# Patient Record
Sex: Female | Born: 1940 | Race: Black or African American | Hispanic: No | Smoking: Never smoker
Health system: Southern US, Community
[De-identification: ages and names within clinical notes are randomized; demographics above are authoritative.]

## PROBLEM LIST (undated history)

## (undated) DIAGNOSIS — M1990 Unspecified osteoarthritis, unspecified site: Secondary | ICD-10-CM

## (undated) DIAGNOSIS — R519 Headache, unspecified: Secondary | ICD-10-CM

## (undated) DIAGNOSIS — I471 Supraventricular tachycardia: Secondary | ICD-10-CM

## (undated) DIAGNOSIS — T7840XA Allergy, unspecified, initial encounter: Secondary | ICD-10-CM

## (undated) DIAGNOSIS — E785 Hyperlipidemia, unspecified: Secondary | ICD-10-CM

## (undated) DIAGNOSIS — R51 Headache: Secondary | ICD-10-CM

## (undated) DIAGNOSIS — E079 Disorder of thyroid, unspecified: Secondary | ICD-10-CM

## (undated) DIAGNOSIS — I340 Nonrheumatic mitral (valve) insufficiency: Secondary | ICD-10-CM

## (undated) DIAGNOSIS — I493 Ventricular premature depolarization: Secondary | ICD-10-CM

## (undated) DIAGNOSIS — I10 Essential (primary) hypertension: Secondary | ICD-10-CM

## (undated) DIAGNOSIS — Z9289 Personal history of other medical treatment: Secondary | ICD-10-CM

## (undated) DIAGNOSIS — I4719 Other supraventricular tachycardia: Secondary | ICD-10-CM

## (undated) DIAGNOSIS — T783XXA Angioneurotic edema, initial encounter: Secondary | ICD-10-CM

## (undated) DIAGNOSIS — K219 Gastro-esophageal reflux disease without esophagitis: Secondary | ICD-10-CM

## (undated) HISTORY — PX: ABDOMINAL HYSTERECTOMY: SHX81

## (undated) HISTORY — DX: Other supraventricular tachycardia: I47.19

## (undated) HISTORY — DX: Angioneurotic edema, initial encounter: T78.3XXA

## (undated) HISTORY — PX: JOINT REPLACEMENT: SHX530

## (undated) HISTORY — PX: CHOLECYSTECTOMY: SHX55

## (undated) HISTORY — DX: Hyperlipidemia, unspecified: E78.5

## (undated) HISTORY — DX: Personal history of other medical treatment: Z92.89

## (undated) HISTORY — PX: TUBAL LIGATION: SHX77

## (undated) HISTORY — DX: Allergy, unspecified, initial encounter: T78.40XA

## (undated) HISTORY — PX: EYE SURGERY: SHX253

## (undated) HISTORY — DX: Supraventricular tachycardia: I47.1

## (undated) HISTORY — DX: Ventricular premature depolarization: I49.3

## (undated) HISTORY — DX: Nonrheumatic mitral (valve) insufficiency: I34.0

---

## 1998-10-23 HISTORY — PX: REPLACEMENT TOTAL KNEE: SUR1224

## 1999-12-30 ENCOUNTER — Encounter: Payer: Self-pay | Admitting: Orthopedic Surgery

## 2000-01-02 ENCOUNTER — Inpatient Hospital Stay (HOSPITAL_COMMUNITY): Admission: RE | Admit: 2000-01-02 | Discharge: 2000-01-06 | Payer: Self-pay | Admitting: Orthopedic Surgery

## 2001-04-01 ENCOUNTER — Ambulatory Visit (HOSPITAL_COMMUNITY): Admission: RE | Admit: 2001-04-01 | Discharge: 2001-04-01 | Payer: Self-pay | Admitting: Internal Medicine

## 2001-05-13 ENCOUNTER — Ambulatory Visit (HOSPITAL_COMMUNITY): Admission: RE | Admit: 2001-05-13 | Discharge: 2001-05-13 | Payer: Self-pay | Admitting: Family Medicine

## 2001-05-13 ENCOUNTER — Encounter: Payer: Self-pay | Admitting: Family Medicine

## 2001-07-11 ENCOUNTER — Other Ambulatory Visit: Admission: RE | Admit: 2001-07-11 | Discharge: 2001-07-11 | Payer: Self-pay | Admitting: Obstetrics and Gynecology

## 2002-05-28 ENCOUNTER — Encounter: Payer: Self-pay | Admitting: Family Medicine

## 2002-05-28 ENCOUNTER — Ambulatory Visit (HOSPITAL_COMMUNITY): Admission: RE | Admit: 2002-05-28 | Discharge: 2002-05-28 | Payer: Self-pay | Admitting: Family Medicine

## 2003-06-26 ENCOUNTER — Encounter: Payer: Self-pay | Admitting: Family Medicine

## 2003-06-26 ENCOUNTER — Ambulatory Visit (HOSPITAL_COMMUNITY): Admission: RE | Admit: 2003-06-26 | Discharge: 2003-06-26 | Payer: Self-pay | Admitting: Family Medicine

## 2003-07-19 ENCOUNTER — Encounter: Payer: Self-pay | Admitting: Family Medicine

## 2003-07-19 ENCOUNTER — Emergency Department (HOSPITAL_COMMUNITY): Admission: EM | Admit: 2003-07-19 | Discharge: 2003-07-19 | Payer: Self-pay | Admitting: Emergency Medicine

## 2003-10-19 ENCOUNTER — Ambulatory Visit (HOSPITAL_COMMUNITY): Admission: RE | Admit: 2003-10-19 | Discharge: 2003-10-19 | Payer: Self-pay | Admitting: Family Medicine

## 2003-11-30 ENCOUNTER — Encounter (HOSPITAL_COMMUNITY): Admission: RE | Admit: 2003-11-30 | Discharge: 2003-12-30 | Payer: Self-pay | Admitting: Orthopedic Surgery

## 2004-09-28 ENCOUNTER — Ambulatory Visit (HOSPITAL_COMMUNITY): Admission: RE | Admit: 2004-09-28 | Discharge: 2004-09-28 | Payer: Self-pay | Admitting: Family Medicine

## 2005-10-19 ENCOUNTER — Inpatient Hospital Stay (HOSPITAL_COMMUNITY): Admission: EM | Admit: 2005-10-19 | Discharge: 2005-10-25 | Payer: Self-pay | Admitting: Emergency Medicine

## 2005-10-20 ENCOUNTER — Encounter (INDEPENDENT_AMBULATORY_CARE_PROVIDER_SITE_OTHER): Payer: Self-pay | Admitting: General Surgery

## 2005-10-23 DIAGNOSIS — Z9289 Personal history of other medical treatment: Secondary | ICD-10-CM

## 2005-10-23 HISTORY — DX: Personal history of other medical treatment: Z92.89

## 2005-10-24 ENCOUNTER — Ambulatory Visit: Payer: Self-pay | Admitting: *Deleted

## 2005-11-14 ENCOUNTER — Emergency Department (HOSPITAL_COMMUNITY): Admission: EM | Admit: 2005-11-14 | Discharge: 2005-11-14 | Payer: Self-pay | Admitting: Emergency Medicine

## 2005-12-08 ENCOUNTER — Ambulatory Visit (HOSPITAL_COMMUNITY): Admission: RE | Admit: 2005-12-08 | Discharge: 2005-12-08 | Payer: Self-pay | Admitting: Family Medicine

## 2006-12-17 ENCOUNTER — Ambulatory Visit (HOSPITAL_COMMUNITY): Admission: RE | Admit: 2006-12-17 | Discharge: 2006-12-17 | Payer: Self-pay | Admitting: Family Medicine

## 2007-12-27 ENCOUNTER — Ambulatory Visit (HOSPITAL_COMMUNITY): Admission: RE | Admit: 2007-12-27 | Discharge: 2007-12-27 | Payer: Self-pay | Admitting: Family Medicine

## 2008-12-30 ENCOUNTER — Ambulatory Visit (HOSPITAL_COMMUNITY): Admission: RE | Admit: 2008-12-30 | Discharge: 2008-12-30 | Payer: Self-pay | Admitting: Family Medicine

## 2009-12-31 ENCOUNTER — Ambulatory Visit (HOSPITAL_COMMUNITY): Admission: RE | Admit: 2009-12-31 | Discharge: 2009-12-31 | Payer: Self-pay | Admitting: Family Medicine

## 2010-12-19 ENCOUNTER — Other Ambulatory Visit (HOSPITAL_COMMUNITY): Payer: Self-pay | Admitting: Family Medicine

## 2010-12-19 DIAGNOSIS — Z139 Encounter for screening, unspecified: Secondary | ICD-10-CM

## 2011-01-05 ENCOUNTER — Ambulatory Visit (HOSPITAL_COMMUNITY)
Admission: RE | Admit: 2011-01-05 | Discharge: 2011-01-05 | Disposition: A | Discharge status: Home / Self Care | Payer: Medicare Other | Source: Ambulatory Visit | Attending: Family Medicine | Admitting: Family Medicine

## 2011-01-05 DIAGNOSIS — Z139 Encounter for screening, unspecified: Secondary | ICD-10-CM

## 2011-01-05 DIAGNOSIS — Z1231 Encounter for screening mammogram for malignant neoplasm of breast: Secondary | ICD-10-CM | POA: Insufficient documentation

## 2011-02-20 ENCOUNTER — Other Ambulatory Visit: Payer: Self-pay | Admitting: Family Medicine

## 2011-02-20 DIAGNOSIS — Z139 Encounter for screening, unspecified: Secondary | ICD-10-CM

## 2011-02-27 ENCOUNTER — Encounter (HOSPITAL_COMMUNITY): Payer: Self-pay

## 2011-02-27 ENCOUNTER — Ambulatory Visit (HOSPITAL_COMMUNITY)
Admission: RE | Admit: 2011-02-27 | Discharge: 2011-02-27 | Disposition: A | Discharge status: Home / Self Care | Payer: Medicare Other | Source: Ambulatory Visit | Attending: Family Medicine | Admitting: Family Medicine

## 2011-02-27 DIAGNOSIS — Z780 Asymptomatic menopausal state: Secondary | ICD-10-CM | POA: Insufficient documentation

## 2011-02-27 DIAGNOSIS — Z139 Encounter for screening, unspecified: Secondary | ICD-10-CM

## 2011-02-27 HISTORY — DX: Essential (primary) hypertension: I10

## 2011-03-10 NOTE — Discharge Summary (Signed)
NAMERASHEL, OKEEFE             ACCOUNT NO.:  [id number]   MEDICAL RECORD NO.:  [id number]          PATIENT TYPE:  INP   LOCATION:  A219                          FACILITY:  [facility]   PHYSICIAN:  Dirk Dress. Katrinka Blazing, M.D.   DATE OF BIRTH:  01/16/1941   DATE OF ADMISSION:  10/18/2005  DATE OF DISCHARGE:  01/03/2007LH                                 DISCHARGE SUMMARY   DISCHARGE DIAGNOSES:  1.  Acute cholecystitis with cholelithiasis.  2.  Postoperative atelectasis with hypoxemia.  3.  Hypertension.  4.  Hypokalemia.  5.  Gastroesophageal reflux disease.   PROCEDURE:  Laparoscopic cholecystectomy.   DISPOSITION:  The patient is discharged home in stable and improved  condition.   DISCHARGE MEDICATIONS:  1.  Norvasc 5 mg daily.  2.  Ziac one tablet daily.  3.  Ogen 1.25 mg daily.  4.  Levaquin 750 mg daily.  5.  Tylox one to two every 4 hours as needed.   FOLLOW UP:  The patient is scheduled to be seen in the office in 2 weeks.  She will have home health to manage her JP drains.   HOSPITAL COURSE:  This is a 70 year old female with history of acute onset  of severe abdominal pain on December 26.  She was seen in the emergency room  and was noted to have acute cholecystitis with multiple gallstones with  gallbladder wall thickening.  She was admitted and was seen in consultation.  She had mild hypokalemia with potassium 3.2.  A serum amylase was elevated  mildly at 140.  The patient was seen in consultation and was scheduled for  surgery.  Laparoscopic cholecystectomy was done on December 29.  She had  acute gangrenous cholecystitis and was treated with IV antibiotics in the  postop period.  She had some postop atelectasis and hypoxemia which was  treated with nebulizers, increased activity and incentive spirometry.  She  also  had mild volume overload.  Medical problems were managed by Dr. Lilyan Punt.  The patient had an echocardiogram which showed no acute  abnormalities.  By  January 3, she was felt to be stable enough for  discharge.  She was discharged home to have follow-up with Dr. Gerda Diss in 1  week and with me in 2 weeks.      Dirk Dress. Katrinka Blazing, M.D.  Electronically Signed     LCS/MEDQ  D:  12/12/2005  T:  12/12/2005  Job:  [id number]

## 2011-03-10 NOTE — Procedures (Signed)
NAMEBRANDAN, GLAUBER NO.:  [id number]   MEDICAL RECORD NO.:  [id number]          PATIENT TYPE:  INP   LOCATION:  A219                          FACILITY:  [facility]   PHYSICIAN:  Vida Roller, M.D.   DATE OF BIRTH:  1941/05/09   DATE OF PROCEDURE:  10/24/2005  DATE OF DISCHARGE:                                  ECHOCARDIOGRAM   PRIMARY CARE PHYSICIAN:  Dr. Lilyan Punt   TAPE NUMBER:  [id number]   TAPE COUNT:  [id number]   CLINICAL INFORMATION:  This is a woman with increased heart size on chest x-  ray.  Technical quality of the study is difficult.   M-MODE:  AORTA:  31 mm  (<4.0)  LEFT ATRIUM:  41 mm  (<4.0)  SEPTUM:  14 mm  (0.7-1.1)  POSTERIOR WALL:  12 mm  (0.7-1.1)  LV-DIASTOLE:  39 mm  (<5.7)  LV-SYSTOLE:  24 mm  (<4.0)   TWO-DIMENSIONAL AND DOPPLER IMAGING:  The left ventricle is normal size with  preserved LV systolic function.  Estimated ejection fraction 60-65%.  There  are no wall motion abnormalities.  There is left ventricular hypertrophy  which is concentric.   The right ventricle appears to be normal size.   There appears to be mild left atrial enlargement.   There is no obvious valvular heart disease although this evaluation was  limited.  There does not appear to be a pericardial effusion.      Vida Roller, M.D.  Electronically Signed     JH/MEDQ  D:  10/24/2005  T:  10/24/2005  Job:  [id number]

## 2011-03-10 NOTE — Consult Note (Signed)
[name], [name]             ACCOUNT NO.:  [id number]   MEDICAL RECORD NO.:  [id number]          PATIENT TYPE:  OBV   LOCATION:  A219                          FACILITY:  [facility]   PHYSICIAN:  Dirk Dress. Katrinka Blazing, M.D.   DATE OF BIRTH:  1940/12/25   DATE OF CONSULTATION:  DATE OF DISCHARGE:                                   CONSULTATION   CONSULTATION NOTE:  This is a 70 year old female with history of acute onset  of severe abdominal pain on the 26th of December.  This occurred at the  evening meal.  The pain became increasingly more severe.  She had nausea and  vomiting.  She was seen in the emergency room.  She continued to have nausea  and vomiting throughout the night.  The patient was finally admitted and  scheduled for abdominal  ultrasound.  Ultrasound showed acute cholecystitis  with multiple gallstones and gallbladder wall thickening without  pericholecystic fluid, with the common bile duct measuring 5.1 mm.  Patient  is seen in consultation and she is scheduled to have cholecystectomy.   PAST HISTORY:  She has been relatively healthy.  She only has hypertension.  Her only medications are Ziac 2.5/6.25 daily, Ogen 1.25 mg daily and  glucosamine supplement.   SURGERIES:  Hysterectomy, right total knee replacement.   ALLERGIES:  PENICILLIN.   PHYSICAL EXAMINATION:  GENERAL:  The patient is a pleasant female who looks  younger than her stated age.  VITAL SIGNS:  Blood pressure 137/60, pulse 71, respirations 20, temperature  100.3.  HEENT:  Unremarkable.  NECK:  Supple.  No JVD, bruit, adenopathy or thyromegaly.  CHEST:  Clear to auscultation.  HEART:  Regular rate and rhythm without murmur, gallop or rub.  ABDOMEN:  Epigastric and right subcostal tenderness.  Normoactive bowel  sounds.  EXTREMITIES:  No cyanosis, clubbing or edema.  NEUROLOGIC:  No focal motor, sensory or cerebellar deficit.   Her white count is normal.  She has a mild hypokalemia with potassium of  3.2.   Her amylase is slightly elevated at 140.   IMPRESSION:  1.  Acute cholecystitis with cholelithiasis.  2.  Mild elevated amylase, probably related to the acute cholecystitis.  3.  Hypertension.  4.  Hypokalemia.  5.  Gastroesophageal reflux disease.   PLAN:  The patient and her family were counseled for laparoscopic  cholecystectomy.  Will give her three runs of potassium tonight and will  check her BMEP in the morning.  Will also check her amylase in the morning.  If everything else looks OK, will proceed with laparoscopic cholecystectomy  in the morning.      Dirk Dress. Katrinka Blazing, M.D.  Electronically Signed     LCS/MEDQ  D:  10/19/2005  T:  10/20/2005  Job:  [id number]

## 2011-03-10 NOTE — Procedures (Signed)
   NAME:  [name], [name]                       ACCOUNT NO.:  [id number]   MEDICAL RECORD NO.:  [id number]                   PATIENT TYPE:  EMS   LOCATION:  ED                                   FACILITY:  [facility]   PHYSICIAN:  Donna Bernard, M.D.             DATE OF BIRTH:  07/04/1941   DATE OF PROCEDURE:  07/19/2003  DATE OF DISCHARGE:  07/19/2003                                EKG INTERPRETATION   EKG INTERPRETATION:  Electrocardiogram revealed normal sinus rhythm with  nonspecific ST-T changes.  Voltage is somewhat large and is somewhat  elevated in leads V4-V6.  The PR interval is borderline at 0.2.      ___________________________________________                                            Donna Bernard, M.D.   WSL/MEDQ  D:  08/25/2003  T:  08/25/2003  Job:  [id number]

## 2011-03-10 NOTE — H&P (Signed)
[name], [name] NO.:  [id number]   MEDICAL RECORD NO.:  [id number]          PATIENT TYPE:  OBV   LOCATION:  A219                          FACILITY:  [facility]   PHYSICIAN:  Donna Bernard, M.D.DATE OF BIRTH:  1940/11/22   DATE OF ADMISSION:  10/18/2005  DATE OF DISCHARGE:  LH                                HISTORY & PHYSICAL   HISTORY OF PRESENT ILLNESS:  This patient is a 70 year old female with a  benign prior medical history of well-treated hypertension.  The patient was  doing well until the evening before admission.  Soon after the meal, the  patient developed significant epigastric distress.  It was fairly severe  cramping pain that came and went.  This pain was accompanied by significant  nausea and then multiple episodes of vomiting.  The patient went on to the  emergency room and was evaluated.  She continued to have significant  epigastric pain throughout the night along with nausea and vomiting.  The  patient has had no history of any similar abdominal pain before.  She does  give a history of reflux which she takes over-the-counter Zantac or similar  for on occasion.  The patient claims compliance with her medications.   MEDICATIONS:  1.  Ogen 1.25 mg daily.  2.  Glucosamine supplement three daily.  3.  Ziac 2.5/6.25 mg one daily.   PAST SURGICAL HISTORY:  1.  Partial hysterectomy in 1983.  2.  Right knee replacement in 2000.  3.  Last colonoscopy in 2002, with diverticulum.   FAMILY HISTORY:  Positive for breast cancer, hypertension, coronary artery  disease.   ALLERGIES:  PENICILLIN.   SOCIAL HISTORY:  The patient is married.  She is retired from [employer].  No tobacco or alcohol use.  She has three children.   REVIEW OF SYSTEMS:  GASTROINTESTINAL:  No change in bowel habits.  GENITOURINARY:  No urinary symptoms.   PHYSICAL EXAMINATION:  VITAL SIGNS:  Temperature 99.6, blood pressure  142/86.  GENERAL:  The patient is  alert in some distress.  HEENT:  Normal.  NECK:  Supple.  LUNGS:  Clear.  HEART:  Regular rate and rhythm.  ABDOMEN:  Epigastrium moderately tender to deep palpation.  Bowel sounds  present.  No rebound, guarding or masses.  EXTREMITIES:  Normal.  NEUROLOGIC:  Intact.   LABORATORY DATA AND X-RAY FINDINGS:  CBC with white blood count normal.  MET  7 with potassium low at 3.2.  Amylase up slightly at 140.  Liver enzymes  normal.   CT scan of abdomen and pelvis unremarkable.   IMPRESSION:  1.  Rather sudden onset of abdominal pain with epigastric tenderness,      slightly elevated amylase.  Importance of this discussed with the      patient.  2.  Hypertension, well-controlled.  3.  Hypokalemia.  4.  History of reflux.   PLAN:  1.  Gallbladder ultrasound.  2.  Stat triglyceride level to check for possible etiologies of pancreatitis      such as a gallstone.  3.  Further orders as  noted in the chart.      Donna Bernard, M.D.  Electronically Signed     WSL/MEDQ  D:  10/19/2005  T:  10/19/2005  Job:  [id number]

## 2011-03-10 NOTE — Op Note (Signed)
NAMEADALEI, NOVELL             ACCOUNT NO.:  [id number]   MEDICAL RECORD NO.:  [id number]          PATIENT TYPE:  INP   LOCATION:  A219                          FACILITY:  [facility]   PHYSICIAN:  Dirk Dress. Katrinka Blazing, M.D.   DATE OF BIRTH:  11-18-1940   DATE OF PROCEDURE:  10/20/2005  DATE OF DISCHARGE:                                 OPERATIVE REPORT   PREOPERATIVE DIAGNOSIS:  Acute cholecystitis, cholelithiasis.   POSTOPERATIVE DIAGNOSIS:  Acute gangrenous cholecystitis, cholelithiasis.   PROCEDURE:  Laparoscopic cholecystectomy.   SURGEON:  Dirk Dress. Katrinka Blazing, M.D.   DESCRIPTION OF PROCEDURE:  Under general anesthesia the patient's abdomen  was prepped and draped in a sterile field.  A supraumbilical incision was  made and a Veress needle was inserted uneventfully.  The abdomen was  insufflated with 2 L of CO2.  Using a Vis-A-Port guide, a 10-mm port was  placed.  A gangrenous gallbladder was visualized.  The patient was placed in  reverse Trendelenburg position.   Under videoscopic guidance a 10-mm port and two 5-mm ports were placed in  the right subcostal region.  The gallbladder was grasped and positioned.  There were many acute adhesions to the gallbladder.  These were separated  from the wall of the gallbladder with blunt dissection.  Once this was done,  the infundibulum of the gallbladder was dissected until the cystic duct was  identified. The cystic duct appeared to be foreshortened, so it was  carefully dissected back to the gallbladder.  Close to the gallbladder the  cystic duct was clamped with three clamps and divided.  These three clamps  were left in place, since there appeared to be a stone impacted in the  cystic duct and there was no significant leak.  There were two cystic artery  branches.  These were dissected up to the wall of the gallbladder, clipped  with 3 clips and divided.  The gallbladder was severely necrotic.  It was,  therefore decompressed.  After this  was done, it was removed using  electrocautery.  The posterior wall was severely necrotic and green in  texture.  The gallbladder was removed and placed in an EndoCatch device.  It  was pulled from the abdomen.  It contained large and small stones.  Hemostasis in the bed was achieved. There was no bleeding from the bed.  There was no evidence of bile leak, but most of the bed appeared to be green  from necrosis.   A JP drain was placed, placed in the subhepatic space.  It was brought out  through the most lateral stab incision. Inspection was carried out and there  was no evidence of any bleeding.  CO2 was allowed to escape from the abdomen  and the ports were removed.  The incision at the umbilicus was closed with  #0 Vicryl on the fascia.  All  incision were closed with staples.  The drain was secured with 3-0 nylon.  The patient tolerated the procedure well.  Dressings were placed.  She was  awakened from anesthesia uneventfully, transferred to a bed, and taken to  the postanesthetic care unit for further monitoring.      Dirk Dress. Katrinka Blazing, M.D.  Electronically Signed     LCS/MEDQ  D:  10/20/2005  T:  10/20/2005  Job:  [id number]

## 2011-04-25 ENCOUNTER — Emergency Department (HOSPITAL_COMMUNITY)
Admission: EM | Admit: 2011-04-25 | Discharge: 2011-04-26 | Disposition: A | Discharge status: Home / Self Care | Payer: Medicare Other | Attending: Emergency Medicine | Admitting: Emergency Medicine

## 2011-04-25 DIAGNOSIS — L299 Pruritus, unspecified: Secondary | ICD-10-CM | POA: Insufficient documentation

## 2011-04-25 DIAGNOSIS — T783XXA Angioneurotic edema, initial encounter: Secondary | ICD-10-CM | POA: Insufficient documentation

## 2011-04-25 DIAGNOSIS — X58XXXA Exposure to other specified factors, initial encounter: Secondary | ICD-10-CM | POA: Insufficient documentation

## 2011-04-25 DIAGNOSIS — R220 Localized swelling, mass and lump, head: Secondary | ICD-10-CM | POA: Insufficient documentation

## 2011-04-25 DIAGNOSIS — I10 Essential (primary) hypertension: Secondary | ICD-10-CM | POA: Insufficient documentation

## 2011-04-25 DIAGNOSIS — Z79899 Other long term (current) drug therapy: Secondary | ICD-10-CM | POA: Insufficient documentation

## 2011-06-15 ENCOUNTER — Ambulatory Visit (HOSPITAL_COMMUNITY)
Admission: RE | Admit: 2011-06-15 | Discharge: 2011-06-15 | Disposition: A | Discharge status: Home / Self Care | Payer: Medicare Other | Source: Ambulatory Visit | Attending: Internal Medicine | Admitting: Internal Medicine

## 2011-06-15 ENCOUNTER — Encounter (HOSPITAL_COMMUNITY): Payer: Self-pay | Admitting: *Deleted

## 2011-06-15 ENCOUNTER — Encounter (HOSPITAL_COMMUNITY)
Admission: RE | Disposition: A | Discharge status: Home / Self Care | Payer: Self-pay | Source: Ambulatory Visit | Attending: Internal Medicine

## 2011-06-15 ENCOUNTER — Encounter (INDEPENDENT_AMBULATORY_CARE_PROVIDER_SITE_OTHER): Payer: Medicare Other | Admitting: Internal Medicine

## 2011-06-15 DIAGNOSIS — Z1211 Encounter for screening for malignant neoplasm of colon: Secondary | ICD-10-CM

## 2011-06-15 DIAGNOSIS — K644 Residual hemorrhoidal skin tags: Secondary | ICD-10-CM

## 2011-06-15 DIAGNOSIS — Z79899 Other long term (current) drug therapy: Secondary | ICD-10-CM | POA: Insufficient documentation

## 2011-06-15 DIAGNOSIS — I10 Essential (primary) hypertension: Secondary | ICD-10-CM | POA: Insufficient documentation

## 2011-06-15 DIAGNOSIS — K5730 Diverticulosis of large intestine without perforation or abscess without bleeding: Secondary | ICD-10-CM | POA: Insufficient documentation

## 2011-06-15 DIAGNOSIS — Z800 Family history of malignant neoplasm of digestive organs: Secondary | ICD-10-CM

## 2011-06-15 DIAGNOSIS — E119 Type 2 diabetes mellitus without complications: Secondary | ICD-10-CM | POA: Insufficient documentation

## 2011-06-15 HISTORY — PX: COLONOSCOPY: SHX5424

## 2011-06-15 HISTORY — DX: Unspecified osteoarthritis, unspecified site: M19.90

## 2011-06-15 SURGERY — COLONOSCOPY
Anesthesia: Moderate Sedation

## 2011-06-15 MED ORDER — STERILE WATER FOR IRRIGATION IR SOLN
Status: DC | PRN
  Administered 2011-06-15: 10:00:00

## 2011-06-15 MED ORDER — SODIUM CHLORIDE 0.45 % IV SOLN
Freq: Once | INTRAVENOUS | Status: AC
  Administered 2011-06-15: 1000 mL via INTRAVENOUS

## 2011-06-15 MED ORDER — MEPERIDINE HCL 50 MG/ML IJ SOLN
INTRAMUSCULAR | Status: DC | PRN
  Administered 2011-06-15 (×2): 25 mg via INTRAVENOUS

## 2011-06-15 MED ORDER — MEPERIDINE HCL 50 MG/ML IJ SOLN
INTRAMUSCULAR | Status: AC
  Filled 2011-06-15: qty 1

## 2011-06-15 MED ORDER — MIDAZOLAM HCL 5 MG/5ML IJ SOLN
INTRAMUSCULAR | Status: AC
  Filled 2011-06-15: qty 5

## 2011-06-15 MED ORDER — MIDAZOLAM HCL 5 MG/5ML IJ SOLN
INTRAMUSCULAR | Status: DC | PRN
  Administered 2011-06-15 (×2): 2 mg via INTRAVENOUS

## 2011-06-15 NOTE — Op Note (Signed)
COLONOSCOPY PROCEDURE REPORT  PATIENT:  [name]  MR#:  [id number] Birthdate:  10-04-1941, 70 y.o., female Endoscopist:  Dr. Malissa Hippo, MD Referred By:  Dr. Lilyan Punt, MD Procedure Date: 06/15/2011  Procedure:   Colonoscopy  Indications:  Screening colonoscopy patient's last exam was 10 years ago. Since then her brother has been diagnosed with colon carcinoma at age 1 and doing well a few years later.  Informed Consent:  Procedure and risks were reviewed with the patient. Her questions were answered. Informed consent was obtained. Medications:  Demerol 50 mg IV Versed  4 mg IV  Description of procedure:  After a digital rectal exam was performed, that colonoscope was advanced from the anus through the rectum and colon to the area of the cecum, ileocecal valve and appendiceal orifice. The cecum was deeply intubated. These structures were well-seen and photographed for the record. From the level of the cecum and ileocecal valve, the scope was slowly and cautiously withdrawn. The mucosal surfaces were carefully surveyed utilizing scope tip to flexion to facilitate fold flattening as needed. The scope was pulled down into the rectum where a thorough exam including retroflexion was performed.  Findings:   Prep satisfactory. 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  Therapeutic/Diagnostic Maneuvers Performed:   None  Complications:   None  Cecal Withdrawal Time:   10 minutes  Impression:   Examination performed to cecum.  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   Recommendations:  Resume usual medications. High fiber diet. Next examination in 5 years   Jen Eppinger U  06/15/2011 10:17 AM  CC: Dr. Lilyan Punt, MD, MD

## 2011-06-15 NOTE — H&P (Signed)
[name] is an 70 y.o. female.   Chief Complaint: Patient is here for colonoscopy HPI: Patient is 70 year old female was screening colonoscopy. Patient's last exam was 10 years ago and was normal. She denies abdominal pain rectal bleeding or recent change in her bowel habits. She has a good appetite and has not lost any weight. She walks regularly for health reasons. Her brother was diagnosed with colon carcinoma a few years ago at age 42 and is doing fine.   Past Medical History  Diagnosis Date  . Hypertension   . Arthritis     Past Surgical History  Procedure Date  . Abdominal hysterectomy   . Joint replacement   . Tubal ligation     Family History  Problem Relation Age of Onset  . Hypertension Mother   . Coronary artery disease Mother   . Hypertension Father   . Coronary artery disease Father   . Heart failure Sister   . Hypertension Sister   . Cancer Brother   . Diabetes Brother    Social History:  reports that she has never smoked. She does not have any smokeless tobacco history on file. She reports that she does not drink alcohol or use illicit drugs.  Allergies:  Allergies  Allergen Reactions  . Penicillins Hives    Medications Prior to Admission  Medication Dose Route Frequency Provider Last Rate Last Dose  . 0.45 % sodium chloride infusion   Intravenous Once Malissa Hippo, MD 20 mL/hr at 06/15/11 0850 1,000 mL at 06/15/11 0850  . meperidine (DEMEROL) 50 MG/ML injection           . midazolam (VERSED) 5 MG/5ML injection            Medications Prior to Admission  Medication Sig Dispense Refill  . amLODipine (NORVASC) 10 MG tablet Take 10 mg by mouth daily.        . bisoprolol-hydrochlorothiazide (ZIAC) 2.5-6.25 MG per tablet Take 1 tablet by mouth daily.          No results found for this or any previous visit (from the past 48 hour(s)). No results found.  Review of Systems  Constitutional: Negative for weight loss.  Gastrointestinal: Negative  for abdominal pain, diarrhea, constipation, blood in stool and melena.    Blood pressure 156/69, pulse 66, temperature 98.6 F (37 C), temperature source Oral, resp. rate 18, height 5\' 9"  (1.753 m), weight 182 lb (82.555 kg), SpO2 97.00%. Physical Exam  Constitutional: She appears well-developed and well-nourished.  HENT:  Mouth/Throat: Oropharynx is clear and moist.  Eyes: Conjunctivae are normal. No scleral icterus.  Neck: No thyromegaly present.  Cardiovascular: Normal rate, regular rhythm and normal heart sounds.   No murmur heard. Respiratory: Breath sounds normal.  GI: Soft. She exhibits no distension and no mass. There is no tenderness.  Musculoskeletal: She exhibits no edema.  Lymphadenopathy:    She has no cervical adenopathy.  Neurological: She is alert.  Skin: Skin is warm and dry.     Assessment/Plan Screening colonoscopy. Family history positive for colorectal carcinoma in first-degree relative( brother  ).  REHMAN,NAJEEB U 06/15/2011, 9:47 AM

## 2011-06-21 ENCOUNTER — Encounter (HOSPITAL_COMMUNITY): Payer: Self-pay | Admitting: Internal Medicine

## 2011-12-11 ENCOUNTER — Other Ambulatory Visit: Payer: Self-pay | Admitting: Family Medicine

## 2011-12-11 DIAGNOSIS — Z139 Encounter for screening, unspecified: Secondary | ICD-10-CM

## 2012-01-08 ENCOUNTER — Ambulatory Visit (HOSPITAL_COMMUNITY)
Admission: RE | Admit: 2012-01-08 | Discharge: 2012-01-08 | Disposition: A | Discharge status: Home / Self Care | Payer: Medicare Other | Source: Ambulatory Visit | Attending: Family Medicine | Admitting: Family Medicine

## 2012-01-08 DIAGNOSIS — Z1231 Encounter for screening mammogram for malignant neoplasm of breast: Secondary | ICD-10-CM | POA: Insufficient documentation

## 2012-01-08 DIAGNOSIS — Z139 Encounter for screening, unspecified: Secondary | ICD-10-CM

## 2012-10-30 ENCOUNTER — Other Ambulatory Visit: Payer: Self-pay | Admitting: Family Medicine

## 2012-10-30 ENCOUNTER — Ambulatory Visit (HOSPITAL_COMMUNITY)
Admission: RE | Admit: 2012-10-30 | Discharge: 2012-10-30 | Disposition: A | Discharge status: Home / Self Care | Payer: Medicare Other | Source: Ambulatory Visit | Attending: Family Medicine | Admitting: Family Medicine

## 2012-10-30 DIAGNOSIS — M25569 Pain in unspecified knee: Secondary | ICD-10-CM | POA: Insufficient documentation

## 2012-10-30 DIAGNOSIS — M25562 Pain in left knee: Secondary | ICD-10-CM

## 2013-01-01 ENCOUNTER — Other Ambulatory Visit: Payer: Self-pay | Admitting: Family Medicine

## 2013-01-01 DIAGNOSIS — Z139 Encounter for screening, unspecified: Secondary | ICD-10-CM

## 2013-01-09 ENCOUNTER — Ambulatory Visit (HOSPITAL_COMMUNITY)
Admission: RE | Admit: 2013-01-09 | Discharge: 2013-01-09 | Disposition: A | Discharge status: Home / Self Care | Payer: Medicare Other | Source: Ambulatory Visit | Attending: Family Medicine | Admitting: Family Medicine

## 2013-01-09 DIAGNOSIS — Z139 Encounter for screening, unspecified: Secondary | ICD-10-CM

## 2013-01-09 DIAGNOSIS — Z1231 Encounter for screening mammogram for malignant neoplasm of breast: Secondary | ICD-10-CM | POA: Insufficient documentation

## 2013-03-06 ENCOUNTER — Other Ambulatory Visit: Payer: Self-pay | Admitting: Family Medicine

## 2013-03-06 LAB — LIPID PANEL
Cholesterol: 214 mg/dL — ABNORMAL HIGH (ref 0–200)
Triglycerides: 99 mg/dL (ref <150)
VLDL: 20 mg/dL (ref 0–40)

## 2013-03-06 LAB — HEPATIC FUNCTION PANEL
ALT: 11 U/L (ref 0–35)
Alkaline Phosphatase: 60 U/L (ref 39–117)
Bilirubin, Direct: 0.1 mg/dL (ref 0.0–0.3)
Indirect Bilirubin: 0.6 mg/dL (ref 0.0–0.9)
Total Protein: 6.5 g/dL (ref 6.0–8.3)

## 2013-03-06 LAB — T4, FREE: Free T4: 1.2 ng/dL (ref 0.80–1.80)

## 2013-03-09 ENCOUNTER — Encounter: Payer: Self-pay | Admitting: Family Medicine

## 2013-03-18 ENCOUNTER — Ambulatory Visit: Payer: Self-pay | Admitting: Family Medicine

## 2013-03-23 ENCOUNTER — Other Ambulatory Visit: Payer: Self-pay | Admitting: Family Medicine

## 2013-04-21 ENCOUNTER — Encounter: Payer: Self-pay | Admitting: Nurse Practitioner

## 2013-04-21 ENCOUNTER — Ambulatory Visit (INDEPENDENT_AMBULATORY_CARE_PROVIDER_SITE_OTHER): Payer: Medicare Other | Admitting: Nurse Practitioner

## 2013-04-21 VITALS — BP 132/70 | Temp 98.4°F | Wt 187.5 lb

## 2013-04-21 DIAGNOSIS — Z23 Encounter for immunization: Secondary | ICD-10-CM

## 2013-04-21 DIAGNOSIS — T148 Other injury of unspecified body region: Secondary | ICD-10-CM

## 2013-04-21 DIAGNOSIS — L02419 Cutaneous abscess of limb, unspecified: Secondary | ICD-10-CM

## 2013-04-21 DIAGNOSIS — W57XXXA Bitten or stung by nonvenomous insect and other nonvenomous arthropods, initial encounter: Secondary | ICD-10-CM

## 2013-04-21 MED ORDER — DOXYCYCLINE HYCLATE 100 MG PO TABS: 100.0000 mg | ORAL_TABLET | Freq: Two times a day (BID) | ORAL | Status: DC

## 2013-04-21 NOTE — Patient Instructions (Addendum)
Burrow's solution as directed; use on compresses to help dry out weeping of wound

## 2013-04-23 ENCOUNTER — Ambulatory Visit (INDEPENDENT_AMBULATORY_CARE_PROVIDER_SITE_OTHER): Payer: Medicare Other | Admitting: Nurse Practitioner

## 2013-04-23 ENCOUNTER — Encounter: Payer: Self-pay | Admitting: Nurse Practitioner

## 2013-04-23 VITALS — BP 150/62 | Temp 98.4°F | Wt 185.5 lb

## 2013-04-23 DIAGNOSIS — L03119 Cellulitis of unspecified part of limb: Secondary | ICD-10-CM

## 2013-04-23 NOTE — Progress Notes (Signed)
Subjective:  Presents complaints of a sore area on her right lower leg which has gotten progressively worse over the past few days. Started off with a small bump then progressed to a blister. Mildly tender to palpation. Mildly pruritic. No fever. Remembers getting some portal bite in the area. Had another one on her arm which this one has resolved without difficulty. No other rash. No headache. No joint pain.  Objective:   BP 132/70  Temp(Src) 98.4 F (36.9 C) (Oral)  Wt 187 lb 8 oz (85.049 kg)  BMI 27.68 kg/m2 NAD. Alert, oriented. Lungs clear. Heart regular rate rhythm. A 3-4 cm open circular lesion noted on the right lower posterior leg. Several small bullae noted within this space. Several have ruptured leaving a superficial open wound. Clear yellowish fluid constantly draining from area. Culture obtained.   Assessment:Insect bite  Cellulitis and abscess of leg, except foot - Plan: Wound culture  Need for prophylactic vaccination and inoculation against other viral diseases(V04.89) - Plan: Dt vaccine greater than 7yo IM  Some form of spider or insect bite with possible secondary infection Plan:Burrow's solution as directed; use on compresses to help dry out weeping of wound Meds ordered this encounter  Medications  . doxycycline (VIBRA-TABS) 100 MG tablet    Sig: Take 1 tablet (100 mg total) by mouth 2 (two) times daily.    Dispense:  14 tablet    Refill:  0    Order Specific Question:  Supervising Provider    Answer:  Merlyn Albert [[id number]]   Warning signs reviewed. Recheck in 48 hours, call back sooner if worse.

## 2013-04-24 ENCOUNTER — Encounter: Payer: Self-pay | Admitting: Nurse Practitioner

## 2013-04-24 DIAGNOSIS — E785 Hyperlipidemia, unspecified: Secondary | ICD-10-CM | POA: Insufficient documentation

## 2013-04-24 DIAGNOSIS — I10 Essential (primary) hypertension: Secondary | ICD-10-CM | POA: Insufficient documentation

## 2013-04-24 LAB — WOUND CULTURE: Gram Stain: NONE SEEN

## 2013-04-24 NOTE — Progress Notes (Signed)
Subjective:  Presents for recheck on the lesion on her right leg see previous note. Less drainage noted. Area has not spread. Slight increase in tenderness. No fever. Did not try Burrows solution.   Objective:   BP 150/62  Temp(Src) 98.4 F (36.9 C) (Oral)  Wt 185 lb 8 oz (84.142 kg)  BMI 27.38 kg/m2 NAD. Alert, oriented. Lesion remains the same size as previous visit. There is less drainage noted although some slight clear yellowish fluid can still be seen from within. Tissue looks healthy. There is faint erythema around the border. Mildly tender to palpation.  Assessment:Cellulitis and abscess of leg  Plan: Continue doxycycline as directed. Given information on Ronna Polio solution to use compresses several times a day. Warning signs reviewed. Call back on 7/7 if no significant improvement, go to ER over the weekend if symptoms worsen.

## 2013-04-29 ENCOUNTER — Ambulatory Visit (INDEPENDENT_AMBULATORY_CARE_PROVIDER_SITE_OTHER): Payer: Medicare Other | Admitting: Family Medicine

## 2013-04-29 ENCOUNTER — Encounter: Payer: Self-pay | Admitting: Family Medicine

## 2013-04-29 VITALS — BP 126/68 | Temp 98.5°F | Wt 187.0 lb

## 2013-04-29 DIAGNOSIS — R358 Other polyuria: Secondary | ICD-10-CM

## 2013-04-29 DIAGNOSIS — R3589 Other polyuria: Secondary | ICD-10-CM

## 2013-04-29 LAB — GLUCOSE, POCT (MANUAL RESULT ENTRY): POC Glucose: 104 mg/dl — AB (ref 70–99)

## 2013-04-29 MED ORDER — SULFAMETHOXAZOLE-TRIMETHOPRIM 800-160 MG PO TABS: 1.0000 | ORAL_TABLET | Freq: Two times a day (BID) | ORAL | Status: DC

## 2013-04-29 NOTE — Progress Notes (Signed)
  Subjective:    Patient ID: [name], female    DOB: June 29, 1941, 72 y.o.   MRN: [id number]  HPIhere for a recheck on right leg. She has finished antibiotic but leg is not any better. She wants to get results of wound culture.  Results of the culture was discussed with her. Patient has been trying a cream at home as well as bandage and tape.  Review of Systems No fever per    Objective:   Physical Exam  It is approximately an inch and a half in diameter. It is described over there is no active pus. There are some blisters clear fluid around the edge this could be related to allergy to tape. There is no sign of any fever or severe cellulitis with it there is moderate tenderness      Assessment & Plan:  She is concerned that this area is getting worse. To use Bactrim DS twice a day, dressing daily she will call us with the cream she has been using she may need Bactroban called in. Recheck patient in 2-3 weeks. If this area is getting worse consider referral to wound center. Pulses are good in the feet.  Glucose was checked to rule out possibility of diabetes as intervening factor II poor t healing

## 2013-04-30 ENCOUNTER — Telehealth: Payer: Self-pay | Admitting: Family Medicine

## 2013-04-30 MED ORDER — MUPIROCIN 2 % EX OINT: TOPICAL_OINTMENT | CUTANEOUS | Status: DC

## 2013-04-30 NOTE — Telephone Encounter (Signed)
Med sent electronically to [facility]. Patient notified.

## 2013-04-30 NOTE — Telephone Encounter (Signed)
Fluticasone may be used if itching. Call in bactroban oint 30g, apply bid thin amount for 7 to 10 days

## 2013-04-30 NOTE — Telephone Encounter (Signed)
Patient calling to let you know that she is on the fluticasone propionate cream and nystatin cream [facility]

## 2013-05-10 ENCOUNTER — Encounter: Payer: Self-pay | Admitting: *Deleted

## 2013-05-12 ENCOUNTER — Ambulatory Visit: Payer: Medicare Other | Admitting: Nurse Practitioner

## 2013-05-12 ENCOUNTER — Ambulatory Visit (INDEPENDENT_AMBULATORY_CARE_PROVIDER_SITE_OTHER): Payer: Medicare Other | Admitting: Nurse Practitioner

## 2013-05-12 ENCOUNTER — Encounter: Payer: Self-pay | Admitting: Nurse Practitioner

## 2013-05-12 VITALS — BP 132/70 | Wt 182.0 lb

## 2013-05-12 DIAGNOSIS — T7840XA Allergy, unspecified, initial encounter: Secondary | ICD-10-CM

## 2013-05-12 DIAGNOSIS — L03115 Cellulitis of right lower limb: Secondary | ICD-10-CM

## 2013-05-12 DIAGNOSIS — L03119 Cellulitis of unspecified part of limb: Secondary | ICD-10-CM

## 2013-05-12 MED ORDER — EPINEPHRINE 0.3 MG/0.3ML IJ SOAJ: 0.3000 mg | Freq: Once | INTRAMUSCULAR | Status: DC

## 2013-05-12 MED ORDER — METHYLPREDNISOLONE ACETATE 40 MG/ML IJ SUSP
40.0000 mg | Freq: Once | INTRAMUSCULAR | Status: AC
  Administered 2013-05-12: 40 mg via INTRAMUSCULAR

## 2013-05-12 MED ORDER — PREDNISONE 20 MG PO TABS: ORAL_TABLET | ORAL | Status: DC

## 2013-05-12 NOTE — Patient Instructions (Addendum)
Take Benadryl as directed  Take Zantac 150 mg once a day

## 2013-05-13 DIAGNOSIS — T7840XA Allergy, unspecified, initial encounter: Secondary | ICD-10-CM | POA: Insufficient documentation

## 2013-05-13 NOTE — Progress Notes (Signed)
Subjective:  Presents for recheck. Patient's wound on her right leg has basically healed. Woke up this morning with a flareup of her allergies. Symptoms have been fairly severe in the past. Was 72 years old the first time she had this episode. It was another 18 years before she had another one. About 2 years ago had to go to the emergency room for tongue and lip swelling. Had an allergy workup with local specialist, tested negative. At 2 AM this morning patient began having swelling in her face. Throat has felt slightly tight but no difficulty breathing or swallowing. No wheezing. Has still not identified any specific allergen. No hives. Just completed a course of Bactrim. Symptoms have not been associated with antibiotic use.  Objective:   BP 132/70  Wt 182 lb (82.555 kg)  BMI 26.86 kg/m2 NAD. Alert, oriented. Significant edema of the lower lip. Mild edema of the face. No hives noted. Lungs clear. Heart regular rate rhythm. Wound on right lower leg almost completely healed with one small area of superficial scab. Nontender.  Assessment:Allergic reaction, initial encounter - Plan: methylPREDNISolone acetate (DEPO-MEDROL) injection 40 mg  Cellulitis of leg, right  resolving  Plan: Meds ordered this encounter  Medications  . methylPREDNISolone acetate (DEPO-MEDROL) injection 40 mg    Sig:   . predniSONE (DELTASONE) 20 MG tablet    Sig: 3 po qd x 3 d then 2 po qd x 3 d then 1 po qd x 3 d    Dispense:  18 tablet    Refill:  0    Start 7/22 AM    Order Specific Question:  Supervising Provider    Answer:  Merlyn Albert [[id number]]  . EPINEPHrine (EPI-PEN) 0.3 mg/0.3 mL SOAJ    Sig: Inject 0.3 mLs (0.3 mg total) into the muscle once.    Dispense:  1 Device    Refill:  5    Order Specific Question:  Supervising Provider    Answer:  Merlyn Albert [[id number]]   Patient to keep EpiPen with her at all times. Antihistamines and Zantac as directed. Reviewed warning signs. Seek help immediately if  symptoms worsen. Call back in 48 hours if no improvement.

## 2013-06-21 ENCOUNTER — Telehealth: Payer: Self-pay

## 2013-06-21 NOTE — Telephone Encounter (Signed)
Patient called and stated that she just used her Epipen because she started swelling and after using it she is still experiencing swelling. Advised patient to go to the emergency department immediately. Patient verbalized understanding.

## 2013-06-24 NOTE — Telephone Encounter (Signed)
I spoke with the patient on the day she called in. I discussed with her it sounded like she was having angioedema. She has had this before. It was not compromising her airway. Prednisone was called in. She was advised to take a generic Allegra daily in addition to all of this. If she has further trouble followup. She has had a history of this since childhood. Sounds like idiopathic angioedema. She understood warning signs as well.

## 2013-07-21 ENCOUNTER — Other Ambulatory Visit: Payer: Self-pay | Admitting: Family Medicine

## 2013-07-30 ENCOUNTER — Ambulatory Visit (INDEPENDENT_AMBULATORY_CARE_PROVIDER_SITE_OTHER): Payer: Medicare Other | Admitting: Nurse Practitioner

## 2013-07-30 ENCOUNTER — Encounter: Payer: Self-pay | Admitting: Nurse Practitioner

## 2013-07-30 VITALS — BP 152/90 | Temp 99.1°F | Ht 68.0 in | Wt 189.0 lb

## 2013-07-30 DIAGNOSIS — B349 Viral infection, unspecified: Secondary | ICD-10-CM

## 2013-07-30 DIAGNOSIS — B9789 Other viral agents as the cause of diseases classified elsewhere: Secondary | ICD-10-CM

## 2013-07-30 DIAGNOSIS — R112 Nausea with vomiting, unspecified: Secondary | ICD-10-CM

## 2013-07-30 MED ORDER — PROMETHAZINE HCL 25 MG/ML IJ SOLN
25.0000 mg | Freq: Once | INTRAMUSCULAR | Status: AC
  Administered 2013-07-30: 25 mg via INTRAMUSCULAR

## 2013-07-30 MED ORDER — SUCRALFATE 1 GM/10ML PO SUSP: 1.0000 g | Freq: Four times a day (QID) | ORAL | Status: DC

## 2013-07-30 MED ORDER — PROMETHAZINE HCL 25 MG PO TABS: ORAL_TABLET | ORAL | Status: DC

## 2013-07-30 MED ORDER — RANITIDINE HCL 300 MG PO TABS: 300.0000 mg | ORAL_TABLET | Freq: Every day | ORAL | Status: DC

## 2013-07-30 NOTE — Patient Instructions (Addendum)
  Start Zantac and liquid Carafate as soon as fluids stay down In 4 hours, about 5:30 may take another dose of nausea medicine (phenergan) Clear liquids

## 2013-07-31 ENCOUNTER — Encounter: Payer: Self-pay | Admitting: Nurse Practitioner

## 2013-07-31 NOTE — Progress Notes (Signed)
Subjective:  Presents with her husband for complaints of vomiting that started within the past hour. Has had several episodes. Slight fever. Just started having some headache. No diarrhea. No body aches. No abdominal pain. Has a history of reflux which was under good control. Having some mid chest pain with pain when she swallows fluid or saliva. No shortness of breath. No previous chest pain before vomiting began. No urinary symptoms.  Objective:   BP 152/90  Temp(Src) 99.1 F (37.3 C) (Oral)  Ht 5\' 8"  (1.727 m)  Wt 189 lb (85.73 kg)  BMI 28.74 kg/m2 NAD. Alert, oriented. Several episodes of vomiting/dry heaves noted. Lungs clear. Heart regular rate rhythm. Abdomen soft nondistended with active bowel sounds; mild upper epigastric area tenderness, otherwise benign.  Assessment:Nausea with vomiting - Plan: promethazine (PHENERGAN) injection 25 mg  Viral illness  Acute esophagitis  Plan: Meds ordered this encounter  Medications  . ranitidine (ZANTAC) 300 MG tablet    Sig: Take 1 tablet (300 mg total) by mouth at bedtime.    Dispense:  30 tablet    Refill:  5    Order Specific Question:  Supervising Provider    Answer:  Merlyn Albert [[id number]]  . promethazine (PHENERGAN) 25 MG tablet    Sig: One po q 4 hrs prn vomiting    Dispense:  30 tablet    Refill:  0    Order Specific Question:  Supervising Provider    Answer:  Merlyn Albert [[id number]]  . sucralfate (CARAFATE) 1 GM/10ML suspension    Sig: Take 10 mLs (1 g total) by mouth 4 (four) times daily.    Dispense:  420 mL    Refill:  0    Order Specific Question:  Supervising Provider    Answer:  Merlyn Albert [[id number]]  . promethazine (PHENERGAN) injection 25 mg    Sig:    Given written and verbal instructions on home care. Gradually increase clear fluid intake. Reviewed warning signs including signs of dehydration. Call back in a.m. if vomiting persists, go to ER tonight if symptoms worsen.

## 2013-08-16 ENCOUNTER — Encounter (HOSPITAL_COMMUNITY): Payer: Self-pay | Admitting: Emergency Medicine

## 2013-08-16 ENCOUNTER — Emergency Department (HOSPITAL_COMMUNITY)
Admission: EM | Admit: 2013-08-16 | Discharge: 2013-08-16 | Disposition: A | Discharge status: Home / Self Care | Payer: Medicare Other | Attending: Emergency Medicine | Admitting: Emergency Medicine

## 2013-08-16 DIAGNOSIS — Z8639 Personal history of other endocrine, nutritional and metabolic disease: Secondary | ICD-10-CM | POA: Insufficient documentation

## 2013-08-16 DIAGNOSIS — T783XXA Angioneurotic edema, initial encounter: Secondary | ICD-10-CM | POA: Insufficient documentation

## 2013-08-16 DIAGNOSIS — IMO0002 Reserved for concepts with insufficient information to code with codable children: Secondary | ICD-10-CM | POA: Insufficient documentation

## 2013-08-16 DIAGNOSIS — Z862 Personal history of diseases of the blood and blood-forming organs and certain disorders involving the immune mechanism: Secondary | ICD-10-CM | POA: Insufficient documentation

## 2013-08-16 DIAGNOSIS — Z9189 Other specified personal risk factors, not elsewhere classified: Secondary | ICD-10-CM | POA: Insufficient documentation

## 2013-08-16 DIAGNOSIS — Z79899 Other long term (current) drug therapy: Secondary | ICD-10-CM | POA: Insufficient documentation

## 2013-08-16 DIAGNOSIS — J029 Acute pharyngitis, unspecified: Secondary | ICD-10-CM | POA: Insufficient documentation

## 2013-08-16 DIAGNOSIS — Z880 Allergy status to penicillin: Secondary | ICD-10-CM | POA: Insufficient documentation

## 2013-08-16 DIAGNOSIS — M129 Arthropathy, unspecified: Secondary | ICD-10-CM | POA: Insufficient documentation

## 2013-08-16 DIAGNOSIS — I10 Essential (primary) hypertension: Secondary | ICD-10-CM | POA: Insufficient documentation

## 2013-08-16 MED ORDER — DIPHENHYDRAMINE HCL 50 MG/ML IJ SOLN
25.0000 mg | Freq: Once | INTRAMUSCULAR | Status: AC
  Administered 2013-08-16: 25 mg via INTRAVENOUS
  Filled 2013-08-16: qty 1

## 2013-08-16 MED ORDER — PREDNISONE 50 MG PO TABS: ORAL_TABLET | ORAL | Status: DC

## 2013-08-16 MED ORDER — METHYLPREDNISOLONE SODIUM SUCC 125 MG IJ SOLR
125.0000 mg | Freq: Once | INTRAMUSCULAR | Status: AC
  Administered 2013-08-16: 125 mg via INTRAVENOUS
  Filled 2013-08-16: qty 2

## 2013-08-16 MED ORDER — FAMOTIDINE IN NACL 20-0.9 MG/50ML-% IV SOLN
20.0000 mg | Freq: Once | INTRAVENOUS | Status: AC
  Administered 2013-08-16: 20 mg via INTRAVENOUS
  Filled 2013-08-16: qty 50

## 2013-08-16 MED ORDER — EPINEPHRINE 0.3 MG/0.3ML IJ SOAJ
0.3000 mg | Freq: Once | INTRAMUSCULAR | Status: AC
  Administered 2013-08-16: 0.3 mg via INTRAMUSCULAR
  Filled 2013-08-16: qty 0.3

## 2013-08-16 MED ORDER — FAMOTIDINE 20 MG PO TABS: 20.0000 mg | ORAL_TABLET | Freq: Two times a day (BID) | ORAL | Status: DC

## 2013-08-16 MED ORDER — SODIUM CHLORIDE 0.9 % IV BOLUS (SEPSIS)
1000.0000 mL | Freq: Once | INTRAVENOUS | Status: AC
  Administered 2013-08-16: 1000 mL via INTRAVENOUS

## 2013-08-16 NOTE — ED Notes (Signed)
Drank 1/2 cup of water without difficulty.  States she "thinks she will be ok.  It is better than it was - tongue is noticeably less swollen

## 2013-08-16 NOTE — ED Notes (Addendum)
Pt states the right side of her tongue started swelling around 2pm. Pt states she took 50 mg of Benadryl. Pt states she fell asleep and her tongue was worse when she woke up. Pt states she feels like her tongue is swelling in the back. Pt speech is a little slurred. EDP in with pt. Pt also states she took 3 prednisone tablets. Pt states she is having a little trouble swallowing. Pt states she has had this problem since she was 72yrs old.

## 2013-08-16 NOTE — ED Notes (Signed)
Swelling of tongue has decreased since arrival.  Family still in room with patient.

## 2013-08-16 NOTE — ED Notes (Signed)
Ambulatory to bathroom  With assistance

## 2013-08-16 NOTE — ED Provider Notes (Signed)
CSN: [id number]     Arrival date & time 08/16/13  1909 History   First MD Initiated Contact with Patient 08/16/13 1915     Chief Complaint  Patient presents with  . Oral Swelling   (Consider location/radiation/quality/duration/timing/severity/associated sxs/prior Treatment) HPI Comments: Patient presents with tongue swelling that has gradually worsened since 2 PM. 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 she's had multiple evaluations in the past for the same. She's never been admitted to hospital. She's had some difficulty swallowing and difficulty talking. She took Benadryl at home and some prednisone at home without improvement. She denies any chest pain, shortness of breath, cough or vomiting. She is not on an ACE inhibitor. She is on amlodipine but she states the recurrent angioedema preceded this medication. Denies any rash  , cough or wheezing.she is prescribed an epinephrine pen which he did not use today.   The history is provided by the patient.    Past Medical History  Diagnosis Date  . Hypertension   . Arthritis   . Allergy   . Hyperlipidemia   . Angioedema   . Hx of echocardiogram 10/2005    normal   Past Surgical History  Procedure Laterality Date  . Abdominal hysterectomy    . Joint replacement    . Tubal ligation    . Colonoscopy  06/15/2011    Procedure: COLONOSCOPY;  Surgeon: Malissa Hippo, MD;  Location: [facility];  Service: Endoscopy;  Laterality: N/A;  . Replacement total knee Right 2000  . Cholecystectomy     Family History  Problem Relation Age of Onset  . Hypertension Mother   . Coronary artery disease Mother   . Hypertension Father   . Coronary artery disease Father   . Heart failure Sister   . Hypertension Sister   . Cancer Brother   . Diabetes Brother    History  Substance Use Topics  . Smoking status: Never Smoker   . Smokeless tobacco: Not on file  . Alcohol Use: No   OB History   Grav Para Term  Preterm Abortions TAB SAB Ect Mult Living                 Review of Systems  Constitutional: Negative for fever, activity change and appetite change.  HENT: Positive for sore throat, trouble swallowing and voice change. Negative for rhinorrhea.   Respiratory: Negative for cough, chest tightness and shortness of breath.   Cardiovascular: Negative for chest pain.  Gastrointestinal: Negative for nausea, vomiting and abdominal pain.  Genitourinary: Negative for dysuria and hematuria.  Musculoskeletal: Negative for back pain.  Skin: Negative for rash.  Neurological: Negative for dizziness and headaches.  A complete 10 system review of systems was obtained and all systems are negative except as noted in the HPI and PMH.    Allergies  Penicillins  Home Medications   Current Outpatient Rx  Name  Route  Sig  Dispense  Refill  . amLODipine (NORVASC) 5 MG tablet   Oral   Take 5 mg by mouth daily.         . bisoprolol-hydrochlorothiazide (ZIAC) 2.5-6.25 MG per tablet   Oral   Take 1 tablet by mouth daily.         . meloxicam (MOBIC) 7.5 MG tablet   Oral   Take 7.5 mg by mouth daily.          Marland Kitchen EPINEPHrine (EPI-PEN) 0.3 mg/0.3 mL SOAJ   Intramuscular  Inject 0.3 mLs (0.3 mg total) into the muscle once.   1 Device   5   . famotidine (PEPCID) 20 MG tablet   Oral   Take 1 tablet (20 mg total) by mouth 2 (two) times daily.   30 tablet   0   . predniSONE (DELTASONE) 50 MG tablet      1 tablet PO daily   5 tablet   0    BP 136/60  Pulse 78  Temp(Src) 98.4 F (36.9 C)  Resp 18  Ht 5\' 8"  (1.727 m)  Wt 185 lb (83.915 kg)  BMI 28.14 kg/m2  SpO2 95% Physical Exam  Constitutional: She is oriented to person, place, and time. She appears well-developed and well-nourished. No distress.  HENT:  Head: Normocephalic and atraumatic.  Mouth/Throat: Oropharynx is clear and moist. No oropharyngeal exudate.  Moderate swelling of the tongue. Floor of mouth soft. Uvula midline.  Controlling secretions.  Eyes: Conjunctivae are normal. Pupils are equal, round, and reactive to light.  Neck: Normal range of motion. Neck supple.  Cardiovascular: Normal rate, regular rhythm and normal heart sounds.   No murmur heard. Pulmonary/Chest: Effort normal and breath sounds normal. No respiratory distress.  Abdominal: Soft. There is no tenderness. There is no rebound and no guarding.  Musculoskeletal: Normal range of motion. She exhibits no edema and no tenderness.  Neurological: She is alert and oriented to person, place, and time. No cranial nerve deficit. She exhibits normal muscle tone. Coordination normal.  Skin: Skin is warm.    ED Course  Procedures (including critical care time) Labs Review Labs Reviewed - No data to display Imaging Review No results found.  EKG Interpretation     Ventricular Rate:  58 PR Interval:  172 QRS Duration: 82 QT Interval:  438 QTC Calculation: 429 R Axis:   28 Text Interpretation:  Sinus bradycardia Otherwise normal ECG When compared with ECG of 30-Dec-1999 11:29, No significant change was found No significant change was found            MDM   1. Angioedema, initial encounter    Recurrent angioedema with history of same. He endorses some trouble swallowing and trouble breathing but she is in no distress and controlling her secretions.  OBserved in the ED for 3 hours with improvement in symptoms.  No difficulty breathing or swallowing.  Tongue swelling has improved.   Advised to discontinue amlodipine as may be contributing to angioedema. Return precautions discussed. Will treat with steroids, antihistamines. Patient has epipen and is familiar with its use.  CRITICAL CARE Performed by: Glynn Octave Total critical care time: 30 Critical care time was exclusive of separately billable procedures and treating other patients. Critical care was necessary to treat or prevent imminent or life-threatening  deterioration. Critical care was time spent personally by me on the following activities: development of treatment plan with patient and/or surrogate as well as nursing, discussions with consultants, evaluation of patient's response to treatment, examination of patient, obtaining history from patient or surrogate, ordering and performing treatments and interventions, ordering and review of laboratory studies, ordering and review of radiographic studies, pulse oximetry and re-evaluation of patient's condition.  BP 136/60  Pulse 78  Temp(Src) 98.4 F (36.9 C)  Resp 18  Ht 5\' 8"  (1.727 m)  Wt 185 lb (83.915 kg)  BMI 28.14 kg/m2  SpO2 95%   Glynn Octave, MD 08/16/13 2311

## 2013-08-19 ENCOUNTER — Ambulatory Visit (INDEPENDENT_AMBULATORY_CARE_PROVIDER_SITE_OTHER): Payer: Medicare Other | Admitting: Family Medicine

## 2013-08-19 ENCOUNTER — Encounter: Payer: Self-pay | Admitting: Family Medicine

## 2013-08-19 VITALS — BP 132/82 | Ht 68.5 in | Wt 196.4 lb

## 2013-08-19 DIAGNOSIS — T788XXS Other adverse effects, not elsewhere classified, sequela: Secondary | ICD-10-CM

## 2013-08-19 DIAGNOSIS — T783XXA Angioneurotic edema, initial encounter: Secondary | ICD-10-CM | POA: Insufficient documentation

## 2013-08-19 DIAGNOSIS — I10 Essential (primary) hypertension: Secondary | ICD-10-CM

## 2013-08-19 DIAGNOSIS — T783XXS Angioneurotic edema, sequela: Secondary | ICD-10-CM

## 2013-08-19 MED ORDER — PREDNISONE 50 MG PO TABS: ORAL_TABLET | ORAL | Status: DC

## 2013-08-19 MED ORDER — VERAPAMIL HCL ER 120 MG PO TBCR: 120.0000 mg | EXTENDED_RELEASE_TABLET | Freq: Every day | ORAL | Status: DC

## 2013-08-19 NOTE — Patient Instructions (Signed)
Idiopathic angioedema  Stop amlodipine and mobic  Use fexofenadine 180 mg one daily in place of claritin  Follow up in 3 to 4 weeks  Verapamil 120 mg one daily for blood pressure, please start

## 2013-08-19 NOTE — Progress Notes (Signed)
  Subjective:    Patient ID: [name], female    DOB: 07-31-1941, 72 y.o.   MRN: [id number]  HPI Patient is here today for a f/u from the ER on 10/25.  Her tongue was swollen.   They told her it may be due to the Norvasc, so she hasn't taken any since Saturday.   She has had a reaction in the past, but never with her tongue swelling.no new meds or foods.  ER notes were reviewed in detail. Patient not having any current swelling. Denies any chest tightness pressure pain shortness breath. 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 She is been seen allergist back in 2012. Meds search was done regarding her medications. Norvasc and Mobic in both result with angioedema. Patient was informed this. Family history noncontributory Review of Systems    see above Objective:   Physical Exam  Eardrums normal throat is normal tongue is normal neck is supple lungs are clear heart is regular      Assessment & Plan:  Idiopathic angioedema-generic Allegra every single day. In addition to this [employer]. Stop Mobic. Recommend immediate ER visit if reoccurrence. Prednisone refill given in case it does occur him up in a mild way she could use a tapering dose of prednisone.  In addition to this recommend verapamil 120 mg one daily for blood pressure followup in 3 weeks for blood pressure continue other medications.

## 2013-09-10 ENCOUNTER — Encounter: Payer: Self-pay | Admitting: Family Medicine

## 2013-09-10 ENCOUNTER — Ambulatory Visit (INDEPENDENT_AMBULATORY_CARE_PROVIDER_SITE_OTHER): Payer: Medicare Other | Admitting: Family Medicine

## 2013-09-10 VITALS — BP 148/84 | Ht 68.5 in | Wt 193.0 lb

## 2013-09-10 DIAGNOSIS — IMO0002 Reserved for concepts with insufficient information to code with codable children: Secondary | ICD-10-CM

## 2013-09-10 DIAGNOSIS — M1710 Unilateral primary osteoarthritis, unspecified knee: Secondary | ICD-10-CM

## 2013-09-10 DIAGNOSIS — I10 Essential (primary) hypertension: Secondary | ICD-10-CM

## 2013-09-10 DIAGNOSIS — M1712 Unilateral primary osteoarthritis, left knee: Secondary | ICD-10-CM | POA: Insufficient documentation

## 2013-09-10 DIAGNOSIS — T783XXS Angioneurotic edema, sequela: Secondary | ICD-10-CM

## 2013-09-10 MED ORDER — VERAPAMIL HCL ER 240 MG PO TBCR: 240.0000 mg | EXTENDED_RELEASE_TABLET | Freq: Every day | ORAL | Status: DC

## 2013-09-10 MED ORDER — BISOPROLOL-HYDROCHLOROTHIAZIDE 2.5-6.25 MG PO TABS: 1.0000 | ORAL_TABLET | Freq: Every day | ORAL | Status: DC

## 2013-09-10 NOTE — Patient Instructions (Addendum)
Tylenol 500 mg , may take 2 tablets twice dily as needed ( max is 5 in a day)  DASH Diet The DASH diet stands for "Dietary Approaches to Stop Hypertension." It is a healthy eating plan that has been shown to reduce high blood pressure (hypertension) in as little as 14 days, while also possibly providing other significant health benefits. These other health benefits include reducing the risk of breast cancer after menopause and reducing the risk of type 2 diabetes, heart disease, colon cancer, and stroke. Health benefits also include weight loss and slowing kidney failure in patients with chronic kidney disease.  DIET GUIDELINES  Limit salt (sodium). Your diet should contain less than 1500 mg of sodium daily.  Limit refined or processed carbohydrates. Your diet should include mostly whole grains. Desserts and added sugars should be used sparingly.  Include small amounts of heart-healthy fats. These types of fats include nuts, oils, and tub margarine. Limit saturated and trans fats. These fats have been shown to be harmful in the body. CHOOSING FOODS  The following food groups are based on a 2000 calorie diet. See your Registered Dietitian for individual calorie needs. Grains and Grain Products (6 to 8 servings daily)  Eat More Often: Whole-wheat bread, brown rice, whole-grain or wheat pasta, quinoa, popcorn without added fat or salt (air popped).  Eat Less Often: White bread, white pasta, white rice, cornbread. Vegetables (4 to 5 servings daily)  Eat More Often: Fresh, frozen, and canned vegetables. Vegetables may be raw, steamed, roasted, or grilled with a minimal amount of fat.  Eat Less Often/Avoid: Creamed or fried vegetables. Vegetables in a cheese sauce. Fruit (4 to 5 servings daily)  Eat More Often: All fresh, canned (in natural juice), or frozen fruits. Dried fruits without added sugar. One hundred percent fruit juice ( cup [237 mL] daily).  Eat Less Often: Dried fruits with added  sugar. Canned fruit in light or heavy syrup. [employer], Fish, and Poultry (2 servings or less daily. One serving is 3 to 4 oz [85-114 g]).  Eat More Often: Ninety percent or leaner ground beef, tenderloin, sirloin. Round cuts of beef, chicken breast, [place] breast. All fish. Grill, bake, or broil your meat. Nothing should be fried.  Eat Less Often/Avoid: Fatty cuts of meat, [place], or chicken leg, thigh, or wing. Fried cuts of meat or fish. Dairy (2 to 3 servings)  Eat More Often: Low-fat or fat-free milk, low-fat plain or light yogurt, reduced-fat or part-skim cheese.  Eat Less Often/Avoid: Milk (whole, 2%).Whole milk yogurt. Full-fat cheeses. Nuts, Seeds, and Legumes (4 to 5 servings per week)  Eat More Often: All without added salt.  Eat Less Often/Avoid: Salted nuts and seeds, canned beans with added salt. Fats and Sweets (limited)  Eat More Often: Vegetable oils, tub margarines without trans fats, sugar-free gelatin. Mayonnaise and salad dressings.  Eat Less Often/Avoid: Coconut oils, palm oils, butter, stick margarine, cream, half and half, cookies, candy, pie. FOR MORE INFORMATION The Dash Diet Eating Plan: www.dashdiet.org Document Released: 09/28/2011 Document Revised: 01/01/2012 Document Reviewed: 09/28/2011 [facility] Patient Information 2014 [place], [place].

## 2013-09-10 NOTE — Progress Notes (Signed)
  Subjective:    Patient ID: [name], female    DOB: 1941/05/07, 72 y.o.   MRN: [id number]  HPI Patient is here today for a f/u visit for HTN.  She was told to stop taking Norvasc and Meloxicam to see if that would help with her tongue swelling. Her tongue has not swelled since then, however she is hurting now with arthritis pain.    This patient has not had any swelling issues since stopping the medications but she states she's been having problems with this issue ever since age 22 she even saw allergist in 2012 hasn't stopped that she does take allergy tablet every single day. She truly feels that the medication did not cause her problem. She was hoping to be up to go back on it she is having significant left knee pain and discomfort hurts with certain movements she is doing some limping.  As for her blood pressure is mildly elevated despite medication but she states that she is getting along okay with the medication. Past medical history idiopathic angioedema hypertension hyperlipidemia Family history noncontributory ROS no wheezing chest tightness pressure pain headaches nausea vomiting  Review of Systems     Objective:   Physical Exam Blood pressure is mildly elevated even on recheck lungs clear hearts regular she also has arthritis in the left knee x-ray was reviewed extremities no edema      Assessment & Plan:  Angioedema- will try the tlyenol but might try meloxicam  Again ( has had idiopathic angioedema since age 55 , allergist 2012 w/o help)  HTN-increase the dose of the medication Calan 240 1 daily. Hopefully this will get things under better control. She will check her blood pressure intermittently and followup in the spring time if this causes too much side effects such as severe constipation may need to change medication.  Left knee osteoarthritis for now try Tylenol that doesn't do well enough I am willing to try the meloxicam again I believe her angioedema was  more likely idiopathic angioedema. She understands this and will let us know. Also offered to set her up with orthopedics but she defers on this currently

## 2013-09-17 ENCOUNTER — Other Ambulatory Visit: Payer: Self-pay | Admitting: *Deleted

## 2013-09-17 MED ORDER — BISOPROLOL-HYDROCHLOROTHIAZIDE 2.5-6.25 MG PO TABS: 1.0000 | ORAL_TABLET | Freq: Every day | ORAL | Status: DC

## 2013-09-19 ENCOUNTER — Other Ambulatory Visit: Payer: Self-pay | Admitting: *Deleted

## 2013-09-19 MED ORDER — AMLODIPINE BESYLATE 5 MG PO TABS: 5.0000 mg | ORAL_TABLET | Freq: Every day | ORAL | Status: DC

## 2013-09-19 MED ORDER — RANITIDINE HCL 300 MG PO TABS: 300.0000 mg | ORAL_TABLET | Freq: Every day | ORAL | Status: DC

## 2013-11-12 DIAGNOSIS — H11449 Conjunctival cysts, unspecified eye: Secondary | ICD-10-CM | POA: Insufficient documentation

## 2013-11-12 DIAGNOSIS — H2510 Age-related nuclear cataract, unspecified eye: Secondary | ICD-10-CM | POA: Insufficient documentation

## 2013-12-11 ENCOUNTER — Encounter: Payer: Self-pay | Admitting: Family Medicine

## 2013-12-11 ENCOUNTER — Ambulatory Visit (INDEPENDENT_AMBULATORY_CARE_PROVIDER_SITE_OTHER): Payer: Medicare Other | Admitting: Family Medicine

## 2013-12-11 VITALS — BP 120/70 | Temp 98.5°F | Ht 68.5 in | Wt 197.0 lb

## 2013-12-11 DIAGNOSIS — M1710 Unilateral primary osteoarthritis, unspecified knee: Secondary | ICD-10-CM

## 2013-12-11 DIAGNOSIS — M1712 Unilateral primary osteoarthritis, left knee: Secondary | ICD-10-CM

## 2013-12-11 DIAGNOSIS — IMO0002 Reserved for concepts with insufficient information to code with codable children: Secondary | ICD-10-CM

## 2013-12-11 DIAGNOSIS — J019 Acute sinusitis, unspecified: Secondary | ICD-10-CM

## 2013-12-11 MED ORDER — AZITHROMYCIN 250 MG PO TABS: ORAL_TABLET | ORAL | Status: DC

## 2013-12-11 NOTE — Progress Notes (Signed)
   Subjective:    Patient ID: [name], female    DOB: 05/23/1941, 73 y.o.   MRN: [id number]  Sinusitis This is a new problem. The current episode started in the past 7 days. The problem is unchanged. There has been no fever. The pain is moderate. Associated symptoms include congestion, coughing and headaches. Pertinent negatives include no ear pain or shortness of breath. Past treatments include oral decongestants. The treatment provided no relief.   Some sweats and Ha with coiugh No NV No wheeze, energy level good Left knee pain- Dr Maureen Ralphs  Review of Systems  Constitutional: Negative for fever and activity change.  HENT: Positive for congestion and rhinorrhea. Negative for ear pain.   Eyes: Negative for discharge.  Respiratory: Positive for cough. Negative for shortness of breath and wheezing.   Cardiovascular: Negative for chest pain.  Neurological: Positive for headaches.       Objective:   Physical Exam  Nursing note and vitals reviewed. Constitutional: She appears well-developed.  HENT:  Head: Normocephalic.  Nose: Nose normal.  Mouth/Throat: Oropharynx is clear and moist. No oropharyngeal exudate.  Neck: Neck supple.  Cardiovascular: Normal rate and normal heart sounds.   No murmur heard. Pulmonary/Chest: Effort normal and breath sounds normal. She has no wheezes.  Lymphadenopathy:    She has no cervical adenopathy.  Skin: Skin is warm and dry.   Mild crepitus left knee       Assessment & Plan:  Left knee pain referral to Dr. Maureen Ralphs  Viral syndrome with secondary sinusitis antibiotics prescribed warning signs discussed

## 2013-12-19 ENCOUNTER — Other Ambulatory Visit: Payer: Self-pay | Admitting: Family Medicine

## 2013-12-19 DIAGNOSIS — Z1231 Encounter for screening mammogram for malignant neoplasm of breast: Secondary | ICD-10-CM

## 2014-01-09 ENCOUNTER — Telehealth: Payer: Self-pay | Admitting: Family Medicine

## 2014-01-09 MED ORDER — RANITIDINE HCL 300 MG PO TABS: 300.0000 mg | ORAL_TABLET | Freq: Every day | ORAL | Status: DC

## 2014-01-09 NOTE — Telephone Encounter (Signed)
Patient needs Rx for rantidine to [place]

## 2014-01-09 NOTE — Telephone Encounter (Signed)
Rx sent electronically to pharmacy. Patient notified. 

## 2014-01-12 ENCOUNTER — Ambulatory Visit (HOSPITAL_COMMUNITY)
Admission: RE | Admit: 2014-01-12 | Discharge: 2014-01-12 | Disposition: A | Discharge status: Home / Self Care | Payer: Medicare Other | Source: Ambulatory Visit | Attending: Family Medicine | Admitting: Family Medicine

## 2014-01-12 DIAGNOSIS — Z1231 Encounter for screening mammogram for malignant neoplasm of breast: Secondary | ICD-10-CM | POA: Insufficient documentation

## 2014-01-15 ENCOUNTER — Telehealth: Payer: Self-pay | Admitting: Family Medicine

## 2014-01-15 DIAGNOSIS — Z79899 Other long term (current) drug therapy: Secondary | ICD-10-CM

## 2014-01-15 DIAGNOSIS — R5383 Other fatigue: Principal | ICD-10-CM

## 2014-01-15 DIAGNOSIS — E782 Mixed hyperlipidemia: Secondary | ICD-10-CM

## 2014-01-15 DIAGNOSIS — R5381 Other malaise: Secondary | ICD-10-CM

## 2014-01-15 NOTE — Telephone Encounter (Signed)
Metabolic 7, lipid, liver, TSH-diagnosis diuretic use, hyperlipidemia, fatigue

## 2014-01-15 NOTE — Telephone Encounter (Signed)
Pt has PE scheduled for 02/05/14, will she need bloodwork?  Has not has liver or thyroid checked in a while, please advise

## 2014-01-15 NOTE — Telephone Encounter (Signed)
Blood work ordered in Epic. Patient notified. 

## 2014-01-15 NOTE — Telephone Encounter (Signed)
Orders ready pt notified.

## 2014-01-29 LAB — HEPATIC FUNCTION PANEL
ALT: 13 U/L (ref 0–35)
AST: 12 U/L (ref 0–37)
Albumin: 3.9 g/dL (ref 3.5–5.2)
Alkaline Phosphatase: 70 U/L (ref 39–117)
BILIRUBIN INDIRECT: 0.5 mg/dL (ref 0.2–1.2)
Bilirubin, Direct: 0.1 mg/dL (ref 0.0–0.3)
Total Bilirubin: 0.6 mg/dL (ref 0.2–1.2)
Total Protein: 6.4 g/dL (ref 6.0–8.3)

## 2014-01-29 LAB — BASIC METABOLIC PANEL
BUN: 21 mg/dL (ref 6–23)
CALCIUM: 9.3 mg/dL (ref 8.4–10.5)
CO2: 30 mEq/L (ref 19–32)
Chloride: 104 mEq/L (ref 96–112)
Creat: 0.83 mg/dL (ref 0.50–1.10)
GLUCOSE: 88 mg/dL (ref 70–99)
Potassium: 4.3 mEq/L (ref 3.5–5.3)
SODIUM: 141 meq/L (ref 135–145)

## 2014-01-29 LAB — LIPID PANEL
Cholesterol: 178 mg/dL (ref 0–200)
HDL: 45 mg/dL (ref >39)
LDL Cholesterol: 117 mg/dL — ABNORMAL HIGH (ref 0–99)
Total CHOL/HDL Ratio: 4 Ratio
Triglycerides: 78 mg/dL (ref <150)
VLDL: 16 mg/dL (ref 0–40)

## 2014-01-29 LAB — TSH: TSH: 2.718 u[IU]/mL (ref 0.350–4.500)

## 2014-02-05 ENCOUNTER — Ambulatory Visit (INDEPENDENT_AMBULATORY_CARE_PROVIDER_SITE_OTHER): Payer: Medicare Other | Admitting: Nurse Practitioner

## 2014-02-05 ENCOUNTER — Encounter: Payer: Self-pay | Admitting: Nurse Practitioner

## 2014-02-05 VITALS — BP 132/80 | Ht 68.5 in | Wt 195.0 lb

## 2014-02-05 DIAGNOSIS — Z13820 Encounter for screening for osteoporosis: Secondary | ICD-10-CM

## 2014-02-05 DIAGNOSIS — Z0000 Encounter for general adult medical examination without abnormal findings: Secondary | ICD-10-CM

## 2014-02-05 NOTE — Patient Instructions (Signed)
Zaditor eye drops as directed 

## 2014-02-05 NOTE — Progress Notes (Signed)
   Subjective:    Patient ID: [name], female    DOB: 05-25-1941, 73 y.o.   MRN: [id number]  HPI AWV- Annual Wellness Visit  The patient was seen for their annual wellness visit. The patient's past medical history, surgical history, and family history were reviewed. Pertinent vaccines were reviewed ( tetanus, pneumonia, shingles, flu) The patient's medication list was reviewed and updated. The height and weight were entered. The patient's current BMI is: 29.2  Cognitive screening was completed. Outcome of Mini - Cog: PASS  Falls within the past 6 months: NEGATIVE Current tobacco usage: NEGATIVE (All patients who use tobacco were given written and verbal information on quitting)  Recent listing of emergency department/hospitalizations over the past year were reviewed.  current specialist the patient sees on a regular basis:   Medicare annual wellness visit patient questionnaire was reviewed.  A written screening schedule for the patient for the next 5-10 years was given. Appropriate discussion of followup regarding next visit was discussed.  Regular activity. Slight constipation relieved by Citracel. Regular eye exams. Wears dentures; no mouth sores.      Review of Systems  Constitutional: Negative for activity change, appetite change and fatigue.  HENT: Positive for rhinorrhea. Negative for ear pain, mouth sores, sinus pressure and sore throat.   Respiratory: Negative for cough, chest tightness, shortness of breath and wheezing.   Cardiovascular: Negative for chest pain and leg swelling.  Gastrointestinal: Positive for constipation. Negative for nausea, vomiting, abdominal pain, diarrhea and blood in stool.  Genitourinary: Negative for dysuria, urgency, frequency, vaginal discharge, enuresis, difficulty urinating and pelvic pain.       Objective:   Physical Exam  Vitals reviewed. Constitutional: She is oriented to person, place, and time. She appears  well-developed. No distress.  HENT:  Right Ear: External ear normal.  Left Ear: External ear normal.  Mouth/Throat: Oropharynx is clear and moist.  Neck: Normal range of motion. Neck supple. No tracheal deviation present. No thyromegaly present.  Cardiovascular: Normal rate, regular rhythm and normal heart sounds.  Exam reveals no gallop.   No murmur heard. Pulmonary/Chest: Effort normal and breath sounds normal.  Abdominal: Soft. She exhibits no distension. There is no tenderness.  Genitourinary: Vagina normal. No vaginal discharge found.  External GU: no lesions or rash; signs of hypoestrogenism. Bimanual exam no masses or tenderness; ovaries nonpalpable. Rectal exam: normal; no stool for hemoccult  Musculoskeletal: She exhibits no edema.  Lymphadenopathy:    She has no cervical adenopathy.  Neurological: She is alert and oriented to person, place, and time.  Skin: Skin is warm and dry. No rash noted.  Psychiatric: She has a normal mood and affect. Her behavior is normal.  Breast exam: no dominant masses; axillae: no adenopathy.         Assessment & Plan:  Routine general medical examination at a health care facility - Plan: POC Hemoccult Bld/Stl (3-Cd Home Screen)  Screening for osteoporosis - Plan: DG Bone Density  Recommend regular activity, healthy diet. Daily vit D and calcium supplementation. Return in about 6 months (around 08/07/2014).

## 2014-02-10 ENCOUNTER — Encounter: Payer: Self-pay | Admitting: Nurse Practitioner

## 2014-02-11 ENCOUNTER — Other Ambulatory Visit (HOSPITAL_COMMUNITY): Payer: Medicare Other

## 2014-02-13 ENCOUNTER — Other Ambulatory Visit: Payer: Self-pay | Admitting: *Deleted

## 2014-02-13 DIAGNOSIS — Z0000 Encounter for general adult medical examination without abnormal findings: Secondary | ICD-10-CM

## 2014-02-13 LAB — POC HEMOCCULT BLD/STL (HOME/3-CARD/SCREEN)
Card #3 Fecal Occult Blood, POC: NEGATIVE
FECAL OCCULT BLD: NEGATIVE
Fecal Occult Blood, POC: NEGATIVE

## 2014-02-25 ENCOUNTER — Ambulatory Visit (HOSPITAL_COMMUNITY)
Admission: RE | Admit: 2014-02-25 | Discharge: 2014-02-25 | Disposition: A | Discharge status: Home / Self Care | Payer: Medicare Other | Source: Ambulatory Visit | Attending: Nurse Practitioner | Admitting: Nurse Practitioner

## 2014-02-25 ENCOUNTER — Encounter: Payer: Self-pay | Admitting: Nurse Practitioner

## 2014-02-25 DIAGNOSIS — Z1211 Encounter for screening for malignant neoplasm of colon: Secondary | ICD-10-CM | POA: Insufficient documentation

## 2014-02-25 DIAGNOSIS — Z13820 Encounter for screening for osteoporosis: Secondary | ICD-10-CM

## 2014-02-25 DIAGNOSIS — Z780 Asymptomatic menopausal state: Secondary | ICD-10-CM | POA: Insufficient documentation

## 2014-03-04 ENCOUNTER — Other Ambulatory Visit (HOSPITAL_COMMUNITY): Payer: Medicare Other

## 2014-03-12 DIAGNOSIS — IMO0001 Reserved for inherently not codable concepts without codable children: Secondary | ICD-10-CM | POA: Insufficient documentation

## 2014-03-12 DIAGNOSIS — H02409 Unspecified ptosis of unspecified eyelid: Secondary | ICD-10-CM | POA: Insufficient documentation

## 2014-03-31 ENCOUNTER — Telehealth: Payer: Self-pay | Admitting: Family Medicine

## 2014-03-31 NOTE — Telephone Encounter (Signed)
Opened in error.  Wrong patient .

## 2014-06-18 ENCOUNTER — Other Ambulatory Visit: Payer: Self-pay | Admitting: Family Medicine

## 2014-06-22 ENCOUNTER — Ambulatory Visit (INDEPENDENT_AMBULATORY_CARE_PROVIDER_SITE_OTHER): Payer: Medicare Other | Admitting: Nurse Practitioner

## 2014-06-22 ENCOUNTER — Encounter: Payer: Self-pay | Admitting: Nurse Practitioner

## 2014-06-22 VITALS — BP 136/80 | Ht 68.5 in | Wt 197.0 lb

## 2014-06-22 DIAGNOSIS — R21 Rash and other nonspecific skin eruption: Secondary | ICD-10-CM

## 2014-06-22 MED ORDER — CLOBETASOL PROPIONATE 0.05 % EX CREA: 1.0000 "application " | TOPICAL_CREAM | Freq: Two times a day (BID) | CUTANEOUS | Status: DC

## 2014-06-22 MED ORDER — PREDNISONE 20 MG PO TABS: ORAL_TABLET | ORAL | Status: DC

## 2014-06-22 MED ORDER — TRIAMCINOLONE ACETONIDE 0.1 % EX CREA: 1.0000 "application " | TOPICAL_CREAM | Freq: Two times a day (BID) | CUTANEOUS | Status: DC

## 2014-06-23 DIAGNOSIS — K219 Gastro-esophageal reflux disease without esophagitis: Secondary | ICD-10-CM | POA: Insufficient documentation

## 2014-06-25 ENCOUNTER — Encounter: Payer: Self-pay | Admitting: Nurse Practitioner

## 2014-06-25 NOTE — Progress Notes (Signed)
Subjective:  Presents for complaints of a rash on her right leg and low back area for the past several days. Pruritic. No fever. No head congestion runny nose cough. No wheezing. Minimal relief with Benadryl. History of rashes see previous notes.  Objective:   BP 136/80  Ht 5' 8.5" (1.74 m)  Wt 197 lb (89.359 kg)  BMI 29.51 kg/m2 NAD. Alert, oriented. Lungs clear. Heart regular rhythm. Multiple scattered mildly erythematous lesions with a tiny pinpoint cloudy/clear fluid filled area. Various stages of healing. Most are noted on her right leg with a few on the low back area.  Assessment: Rash and nonspecific skin eruption; possible insect bites  Plan:  Meds ordered this encounter  Medications  . DISCONTD: clobetasol cream (TEMOVATE) 0.05 %    Sig: Apply 1 application topically 2 (two) times daily.    Dispense:  30 g    Refill:  0    Order Specific Question:  Supervising Provider    Answer:  Mikey Kirschner [[id number]]  . predniSONE (DELTASONE) 20 MG tablet    Sig: 3 po qd x 3 d then 2 po qd x 3 d then 1 po qd x 3 d    Dispense:  18 tablet    Refill:  0    Order Specific Question:  Supervising Provider    Answer:  Mikey Kirschner [[id number]]  . triamcinolone cream (KENALOG) 0.1 %    Sig: Apply 1 application topically 2 (two) times daily. Prn rash; use up to 2 weeks    Dispense:  60 g    Refill:  0    Order Specific Question:  Supervising Provider    Answer:  Maggie Font   Originally prescribed clobetasol cream received a phone call from the pharmacy switched to triamcinolone due to cost. Given prescription for prednisone taper in case it is needed. Callback in 7-10 days if no improvement, sooner if worse. Note patient has an EpiPen in case of severe reaction.

## 2014-09-11 ENCOUNTER — Other Ambulatory Visit: Payer: Self-pay | Admitting: Family Medicine

## 2014-09-28 ENCOUNTER — Encounter: Payer: Self-pay | Admitting: Nurse Practitioner

## 2014-09-28 ENCOUNTER — Ambulatory Visit (INDEPENDENT_AMBULATORY_CARE_PROVIDER_SITE_OTHER): Payer: Medicare Other | Admitting: Nurse Practitioner

## 2014-09-28 VITALS — BP 144/70 | Ht 68.5 in | Wt 199.0 lb

## 2014-09-28 DIAGNOSIS — J309 Allergic rhinitis, unspecified: Secondary | ICD-10-CM

## 2014-09-28 DIAGNOSIS — I10 Essential (primary) hypertension: Secondary | ICD-10-CM

## 2014-09-28 MED ORDER — VERAPAMIL HCL ER 240 MG PO TBCR: EXTENDED_RELEASE_TABLET | ORAL | Status: DC

## 2014-09-28 MED ORDER — BISOPROLOL-HYDROCHLOROTHIAZIDE 2.5-6.25 MG PO TABS: 1.0000 | ORAL_TABLET | Freq: Every day | ORAL | Status: DC

## 2014-09-28 MED ORDER — FLUTICASONE PROPIONATE 50 MCG/ACT NA SUSP: 2.0000 | Freq: Every day | NASAL | Status: DC

## 2014-09-28 NOTE — Patient Instructions (Addendum)
Loratadine 10 mg once daily Fluticasone nasal spray if not too expensive If too much get Nasacort AQ OTC ($4 coupon on line)

## 2014-09-29 ENCOUNTER — Encounter: Payer: Self-pay | Admitting: Nurse Practitioner

## 2014-09-29 NOTE — Progress Notes (Signed)
Subjective:  Presents for recheck. No CP/ischemic type pain or SOB. No edema. Also c/o a flare up of her allergies for the past week. Non productive cough. Itchy watery eyes. No fever. Ear pressure. No sore throat or headache. Given eye drops by optometrist.  Objective:   BP 144/70 mmHg  Ht 5' 8.5" (1.74 m)  Wt 199 lb (90.266 kg)  BMI 29.81 kg/m2 NAD. Alert, oriented. Conjunctivae clear. TMs clear effusion. No cerumen. Pharynx clear. Neck supple with mild anterior adenopathy. Lungs clear. Heart RRR. Lower extremities no edema.   Assessment:  Problem List Items Addressed This Visit      Cardiovascular and Mediastinum   Hypertension - Primary (Chronic)   Relevant Medications      verapamil (CALAN-SR) CR tablet      bisoprolol-hydrochlorothiazide (ZIAC) 2.5-6.25 MG per tablet    Other Visit Diagnoses    Allergic rhinitis, unspecified allergic rhinitis type          Plan:  Meds ordered this encounter  Medications  . ketotifen (ALAWAY) 0.025 % ophthalmic solution    Sig: 1 drop.  Marland Kitchen DISCONTD: Multiple Vitamin (MULTIVITAMIN) capsule    Sig: Take 1 capsule by mouth.  . Red Yeast Rice Extract 600 MG CAPS    Sig: Take 2 capsules by mouth.  . DISCONTD: bisoprolol-hydrochlorothiazide (ZIAC) 2.5-6.25 MG per tablet    Sig: Take 1 tablet by mouth.  . DISCONTD: EPINEPHrine 0.3 mg/0.3 mL IJ SOAJ injection    Sig: Inject into the muscle.  . predniSONE (DELTASONE) 20 MG tablet    Sig: as needed.  Marland Kitchen DISCONTD: ranitidine (ZANTAC) 300 MG tablet    Sig: Take 300 mg by mouth.  . DISCONTD: triamcinolone cream (KENALOG) 0.1 %    Sig: Apply topically.  Marland Kitchen DISCONTD: erythromycin ophthalmic ointment    Sig:   . loratadine (CLARITIN) 10 MG tablet    Sig: Take 10 mg by mouth daily.  . fluticasone (FLONASE) 50 MCG/ACT nasal spray    Sig: Place 2 sprays into both nostrils daily.    Dispense:  16 g    Refill:  6    Order Specific Question:  Supervising Provider    Answer:  Mikey Kirschner [[id number]]  .  verapamil (CALAN-SR) 240 MG CR tablet    Sig: TAKE ONE (1) TABLET BY MOUTH EVERY DAY    Dispense:  90 tablet    Refill:  1    Order Specific Question:  Supervising Provider    Answer:  Mikey Kirschner [[id number]]  . bisoprolol-hydrochlorothiazide (ZIAC) 2.5-6.25 MG per tablet    Sig: Take 1 tablet by mouth daily.    Dispense:  90 tablet    Refill:  1    Order Specific Question:  Supervising Provider    Answer:  Mikey Kirschner [[id number]]   Continue Claritin. Add Flonase to regimen. Call back if worsens or persists. Return in about 6 months (around 03/30/2015).

## 2014-11-04 ENCOUNTER — Encounter (HOSPITAL_COMMUNITY): Payer: Self-pay

## 2014-11-04 ENCOUNTER — Emergency Department (HOSPITAL_COMMUNITY)
Admission: EM | Admit: 2014-11-04 | Discharge: 2014-11-04 | Disposition: A | Discharge status: Home / Self Care | Payer: Medicare Other | Attending: Emergency Medicine | Admitting: Emergency Medicine

## 2014-11-04 DIAGNOSIS — T783XXA Angioneurotic edema, initial encounter: Secondary | ICD-10-CM | POA: Insufficient documentation

## 2014-11-04 DIAGNOSIS — I10 Essential (primary) hypertension: Secondary | ICD-10-CM | POA: Insufficient documentation

## 2014-11-04 DIAGNOSIS — Y9389 Activity, other specified: Secondary | ICD-10-CM | POA: Diagnosis not present

## 2014-11-04 DIAGNOSIS — Z8739 Personal history of other diseases of the musculoskeletal system and connective tissue: Secondary | ICD-10-CM | POA: Insufficient documentation

## 2014-11-04 DIAGNOSIS — R220 Localized swelling, mass and lump, head: Secondary | ICD-10-CM | POA: Diagnosis present

## 2014-11-04 DIAGNOSIS — Y998 Other external cause status: Secondary | ICD-10-CM | POA: Diagnosis not present

## 2014-11-04 DIAGNOSIS — Z8639 Personal history of other endocrine, nutritional and metabolic disease: Secondary | ICD-10-CM | POA: Diagnosis not present

## 2014-11-04 DIAGNOSIS — Z79899 Other long term (current) drug therapy: Secondary | ICD-10-CM | POA: Diagnosis not present

## 2014-11-04 DIAGNOSIS — Y9289 Other specified places as the place of occurrence of the external cause: Secondary | ICD-10-CM | POA: Insufficient documentation

## 2014-11-04 DIAGNOSIS — Z880 Allergy status to penicillin: Secondary | ICD-10-CM | POA: Insufficient documentation

## 2014-11-04 DIAGNOSIS — Z7951 Long term (current) use of inhaled steroids: Secondary | ICD-10-CM | POA: Diagnosis not present

## 2014-11-04 DIAGNOSIS — X58XXXA Exposure to other specified factors, initial encounter: Secondary | ICD-10-CM | POA: Diagnosis not present

## 2014-11-04 MED ORDER — FAMOTIDINE 20 MG PO TABS: 20.0000 mg | ORAL_TABLET | Freq: Two times a day (BID) | ORAL | Status: DC

## 2014-11-04 MED ORDER — ROCURONIUM BROMIDE 50 MG/5ML IV SOLN
INTRAVENOUS | Status: AC
  Filled 2014-11-04: qty 2

## 2014-11-04 MED ORDER — EPINEPHRINE 0.3 MG/0.3ML IJ SOAJ
0.3000 mg | Freq: Once | INTRAMUSCULAR | Status: AC
  Administered 2014-11-04: 0.3 mg via INTRAMUSCULAR

## 2014-11-04 MED ORDER — DIPHENHYDRAMINE HCL 50 MG/ML IJ SOLN
25.0000 mg | Freq: Once | INTRAMUSCULAR | Status: AC
  Administered 2014-11-04: 25 mg via INTRAVENOUS
  Filled 2014-11-04: qty 1

## 2014-11-04 MED ORDER — PREDNISONE 20 MG PO TABS: 40.0000 mg | ORAL_TABLET | Freq: Every day | ORAL | Status: DC

## 2014-11-04 MED ORDER — METHYLPREDNISOLONE SODIUM SUCC 125 MG IJ SOLR
125.0000 mg | Freq: Once | INTRAMUSCULAR | Status: AC
  Administered 2014-11-04: 125 mg via INTRAVENOUS
  Filled 2014-11-04: qty 2

## 2014-11-04 MED ORDER — SUCCINYLCHOLINE CHLORIDE 20 MG/ML IJ SOLN
INTRAMUSCULAR | Status: AC
  Filled 2014-11-04: qty 1

## 2014-11-04 MED ORDER — LIDOCAINE HCL (CARDIAC) 20 MG/ML IV SOLN
INTRAVENOUS | Status: AC
  Filled 2014-11-04: qty 5

## 2014-11-04 MED ORDER — FAMOTIDINE IN NACL 20-0.9 MG/50ML-% IV SOLN
20.0000 mg | INTRAVENOUS | Status: AC
  Administered 2014-11-04: 20 mg via INTRAVENOUS
  Filled 2014-11-04: qty 50

## 2014-11-04 MED ORDER — DIPHENHYDRAMINE HCL 25 MG PO TABS: 25.0000 mg | ORAL_TABLET | Freq: Four times a day (QID) | ORAL | Status: DC | PRN

## 2014-11-04 MED ORDER — ETOMIDATE 2 MG/ML IV SOLN
INTRAVENOUS | Status: AC
  Filled 2014-11-04: qty 20

## 2014-11-04 MED ORDER — EPINEPHRINE 0.3 MG/0.3ML IJ SOAJ
INTRAMUSCULAR | Status: AC
  Filled 2014-11-04: qty 0.3

## 2014-11-04 NOTE — Discharge Instructions (Signed)
Angioedema Angioedema is sudden puffiness (swelling), often of the skin. It can happen:  On your face or privates (genitals).  In your belly (abdomen) or other body parts. It usually happens quickly and gets better in 1 or 2 days. It often starts at night and is found when you wake up. You may get red, itchy patches of skin (hives). Attacks can be dangerous if your breathing passages get puffy. The condition may happen only once, or it can come back at random times. It may happen for several years before it goes away for good. HOME CARE  Only take medicines as told by your doctor.  Always carry your emergency allergy medicines with you.  Wear a medical bracelet as told by your doctor.  Avoid things that you know will cause attacks (triggers). GET HELP IF:  You have another attack.  Your attacks happen more often or get worse.  The condition was passed to you by your parents and you want to have children. GET HELP RIGHT AWAY IF:   Your mouth, tongue, or lips are very puffy.  You have trouble breathing.  You have trouble swallowing.  You pass out (faint). MAKE SURE YOU:   Understand these instructions.  Will watch your condition.  Will get help right away if you are not doing well or get worse. Document Released: 09/27/2009 Document Revised: 07/30/2013 Document Reviewed: 06/02/2013 [facility] Patient Information 2015 [place], [place]. This information is not intended to replace advice given to you by your health care provider. Make sure you discuss any questions you have with your health care provider.   Prednisone 40 mg a day for 5 days Benadryl 25 mg every 6 hours as needed Pepcid 20 mg twice a day  Follow-up in the next 24 hours for a recheck with your doctor

## 2014-11-04 NOTE — ED Provider Notes (Signed)
CSN: [id number]     Arrival date & time 11/04/14  1441 History   First MD Initiated Contact with Patient 11/04/14 1452     Chief Complaint  Patient presents with  . 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 Wolfgang Phoenix.  She states that 30 minutes ago she developed recurrent angioedema of her tongue - this was acute in onset, constant, moderate (states has had worse) denies having to have been admitted to the hospital in the past and denies any other sx including cP, SOB, weakness, itching, dyspnea, difficulty swallowing or changes in vision.  She has an epi pen at home but decided not to use it b/c she was close to the hospital.    The history is provided by the patient.    Past Medical History  Diagnosis Date  . Hypertension   . Arthritis   . Allergy   . Hyperlipidemia   . Angioedema   . Hx of echocardiogram 10/2005    normal   Past Surgical History  Procedure Laterality Date  . Abdominal hysterectomy    . Joint replacement    . Tubal ligation    . Colonoscopy  06/15/2011    Procedure: COLONOSCOPY;  Surgeon: Rogene Houston, MD;  Location: [facility];  Service: Endoscopy;  Laterality: N/A;  . Replacement total knee Right 2000  . Cholecystectomy    . Eye surgery      removed cyst from right eye in june 2015. sept 9th surgery for eye lid drop on both eyes   Family History  Problem Relation Age of Onset  . Hypertension Mother   . Coronary artery disease Mother   . Hypertension Father   . Coronary artery disease Father   . Heart failure Sister   . Hypertension Sister   . Cancer Brother   . Diabetes Brother    History  Substance Use Topics  . Smoking status: Never Smoker   . Smokeless tobacco: Not on file  . Alcohol  Use: No   OB History    No data available     Review of Systems  All other systems reviewed and are negative.     Allergies  Penicillins  Home Medications   Prior to Admission medications   Medication Sig Start Date End Date Taking? Authorizing Provider  bisoprolol-hydrochlorothiazide (ZIAC) 2.5-6.25 MG per tablet Take 1 tablet by mouth daily. 09/28/14  Yes Nilda Simmer, NP  fluticasone (FLONASE) 50 MCG/ACT nasal spray Place 2 sprays into both nostrils daily. 09/28/14  Yes Nilda Simmer, NP  verapamil (CALAN-SR) 240 MG CR tablet TAKE ONE (1) TABLET BY MOUTH EVERY DAY 09/28/14  Yes Nilda Simmer, NP  diphenhydrAMINE (BENADRYL) 25 MG tablet Take 1 tablet (25 mg total) by mouth every 6 (six) hours as needed for itching (Rash). 11/04/14   Johnna Acosta, MD  EPINEPHrine (EPI-PEN) 0.3 mg/0.3 mL SOAJ Inject 0.3 mLs (0.3 mg total) into the muscle once. 05/12/13   Nilda Simmer, NP  famotidine (PEPCID) 20 MG tablet Take 1 tablet (20 mg total) by mouth 2 (two) times daily. 11/04/14   Johnna Acosta, MD  loratadine (CLARITIN) 10 MG tablet Take 10 mg by mouth daily.    Historical Provider, MD  predniSONE (DELTASONE) 20 MG tablet Take 2 tablets (40 mg total) by mouth daily. 11/04/14   Johnna Acosta, MD  Red Yeast Rice Extract 600 MG CAPS Take 2 capsules by mouth daily.     Historical Provider, MD  triamcinolone cream (KENALOG) 0.1 % Apply 1 application topically 2 (two) times daily. Prn rash; use up to 2 weeks Patient not taking: Reported on 11/04/2014 06/22/14   Nilda Simmer, NP   BP 149/58 mmHg  Pulse 62  Temp(Src) 98.5 F (36.9 C) (Oral)  Resp 16  Ht 5\' 9"  (1.753 m)  Wt 182 lb (82.555 kg)  BMI 26.86 kg/m2  SpO2 93% Physical Exam  Constitutional: She appears well-developed and well-nourished. No distress.  HENT:  Head: Normocephalic and atraumatic.  Mouth/Throat: Oropharynx is clear and moist. No oropharyngeal exudate.  Swollen tongue - angioedema, tongue is  asymmetrical with L > R swelling, entire OP is currently seen.  MMM, phonation is normal.  Eyes: Conjunctivae and EOM are normal. Pupils are equal, round, and reactive to light. Right eye exhibits no discharge. Left eye exhibits no discharge. No scleral icterus.  Neck: Normal range of motion. Neck supple. No JVD present. No thyromegaly present.  No LAD of the neck / submand  Cardiovascular: Normal rate, regular rhythm, normal heart sounds and intact distal pulses.  Exam reveals no gallop and no friction rub.   No murmur heard. Pulmonary/Chest: Effort normal and breath sounds normal. No respiratory distress. She has no wheezes. She has no rales.  Abdominal: Soft. Bowel sounds are normal. She exhibits no distension and no mass. There is no tenderness.  Musculoskeletal: Normal range of motion. She exhibits no edema or tenderness.  Lymphadenopathy:    She has no cervical adenopathy.  Neurological: She is alert. Coordination normal.  Skin: Skin is warm and dry. No rash noted. No erythema.  Psychiatric: She has a normal mood and affect. Her behavior is normal.  Nursing note and vitals reviewed.   ED Course  Procedures (including critical care time) Labs Review Labs Reviewed - No data to display  Imaging Review No results found.    MDM   Final diagnoses:  Angioedema, initial encounter    The pt has no other signs of allerci reaction - VS show hypertension - there is certainly an association with angioedema and HCTZ seen in case reports however these episodes have been going on for decades prior to taking this medicine - she is not on an ACEi and she has had recurrent sx despite meds.  At this time will treat as if idiopathic - anti allergy meds ordered, expect slow recovery - observe in ED for progression of sx, no indication for definitive airway control at this time.   7:17 PM - pt rechecked multiple times - has had initial progresison and worsening of swelling intraoral - epi IM was  given with halting of the swelling.  7:17 PM after epinephrine was given, the patient had a gradual improvement in her swelling and multiple rechecks showed ongoing improvement, at 7:15 PM the patient was checked and had essentially no swelling of the tongue, her voice was normal, she had no difficulty breathing, no difficulty swallowing and states that she feels back to normal and feels ready to go home. She has had a remarkable improvement in her symptoms, she will be given medications as below, she understands indications for return.  Meds given in ED:  Medications  EPINEPHrine (EPI-PEN) 0.3 mg/0.3 mL injection (not administered)  diphenhydrAMINE (  BENADRYL) injection 25 mg (25 mg Intravenous Given 11/04/14 1514)  methylPREDNISolone sodium succinate (SOLU-MEDROL) 125 mg/2 mL injection 125 mg (125 mg Intravenous Given 11/04/14 1515)  famotidine (PEPCID) IVPB 20 mg (0 mg Intravenous Stopped 11/04/14 1545)  EPINEPHrine (EPI-PEN) injection 0.3 mg (0.3 mg Intramuscular Given 11/04/14 1545)    New Prescriptions   DIPHENHYDRAMINE (BENADRYL) 25 MG TABLET    Take 1 tablet (25 mg total) by mouth every 6 (six) hours as needed for itching (Rash).   FAMOTIDINE (PEPCID) 20 MG TABLET    Take 1 tablet (20 mg total) by mouth 2 (two) times daily.   PREDNISONE (DELTASONE) 20 MG TABLET    Take 2 tablets (40 mg total) by mouth daily.       Johnna Acosta, MD 11/04/14 [id number]

## 2014-11-04 NOTE — ED Notes (Signed)
Pt reports has an epi pen but did not use it.  TOok 2 tbsp of benadryl approx 60min ago.

## 2014-11-04 NOTE — ED Notes (Signed)
Dr Miller at bedside. 

## 2014-11-04 NOTE — ED Notes (Signed)
Pt reports tongue started swelling after eating soup and grilled cheese sandwich for  Lunch and tongue started to swell approx 30-30min afterwards.  Pt also on bp medications and took them this morning around 0930-1000.  Pt reports has had this happen before while on norvasc and her doctor changed her norvasc to verapamil.  Pt able to talk and swallow but speech is garbled.

## 2014-11-04 NOTE — ED Notes (Signed)
Pts tongue is decreased in size and pts speech is now clearing. NAD noted at this time. Airway intact.

## 2014-11-11 DIAGNOSIS — T7840XA Allergy, unspecified, initial encounter: Secondary | ICD-10-CM | POA: Diagnosis not present

## 2015-01-01 ENCOUNTER — Other Ambulatory Visit: Payer: Self-pay | Admitting: Family Medicine

## 2015-01-01 DIAGNOSIS — Z1231 Encounter for screening mammogram for malignant neoplasm of breast: Secondary | ICD-10-CM

## 2015-01-14 ENCOUNTER — Ambulatory Visit (HOSPITAL_COMMUNITY)
Admission: RE | Admit: 2015-01-14 | Discharge: 2015-01-14 | Disposition: A | Discharge status: Home / Self Care | Payer: Medicare Other | Source: Ambulatory Visit | Attending: Family Medicine | Admitting: Family Medicine

## 2015-01-14 DIAGNOSIS — Z1231 Encounter for screening mammogram for malignant neoplasm of breast: Secondary | ICD-10-CM | POA: Diagnosis not present

## 2015-02-17 ENCOUNTER — Ambulatory Visit (INDEPENDENT_AMBULATORY_CARE_PROVIDER_SITE_OTHER): Payer: Medicare Other | Admitting: Family Medicine

## 2015-02-17 ENCOUNTER — Encounter: Payer: Self-pay | Admitting: Family Medicine

## 2015-02-17 VITALS — BP 120/70 | Ht 68.0 in | Wt 197.0 lb

## 2015-02-17 DIAGNOSIS — Z23 Encounter for immunization: Secondary | ICD-10-CM | POA: Diagnosis not present

## 2015-02-17 DIAGNOSIS — R5383 Other fatigue: Secondary | ICD-10-CM | POA: Diagnosis not present

## 2015-02-17 DIAGNOSIS — Z0000 Encounter for general adult medical examination without abnormal findings: Secondary | ICD-10-CM

## 2015-02-17 DIAGNOSIS — E785 Hyperlipidemia, unspecified: Secondary | ICD-10-CM

## 2015-02-17 DIAGNOSIS — I10 Essential (primary) hypertension: Secondary | ICD-10-CM | POA: Diagnosis not present

## 2015-02-17 MED ORDER — VERAPAMIL HCL ER 240 MG PO TBCR
EXTENDED_RELEASE_TABLET | ORAL | Status: DC
Start: 2015-02-17 — End: 2015-08-17

## 2015-02-17 MED ORDER — BISOPROLOL-HYDROCHLOROTHIAZIDE 2.5-6.25 MG PO TABS: 1.0000 | ORAL_TABLET | Freq: Every day | ORAL | Status: DC

## 2015-02-17 NOTE — Progress Notes (Signed)
   Subjective:    Patient ID: [name], female    DOB: Sep 20, 1941, 74 y.o.   MRN: [id number]  HPI AWV- Annual Wellness Visit  The patient was seen for their annual wellness visit. The patient's past medical history, surgical history, and family history were reviewed. Pertinent vaccines were reviewed ( tetanus, pneumonia, shingles, flu) The patient's medication list was reviewed and updated.  The height and weight were entered. The patient's current BMI is: 29.95  Cognitive screening was completed. Outcome of Mini - Cog: passed  Falls within the past 6 months:none  Current tobacco usage: non-smoker (All patients who use tobacco were given written and verbal information on quitting)  Recent listing of emergency department/hospitalizations over the past year were reviewed.  current specialist the patient sees on a regular basis: none   Medicare annual wellness visit patient questionnaire was reviewed.  A written screening schedule for the patient for the next 5-10 years was given. Appropriate discussion of followup regarding next visit was discussed.  Patient states that she has no concerns at this time.     Review of Systems  Constitutional: Negative for activity change, appetite change and fatigue.  HENT: Negative for congestion, ear discharge and rhinorrhea.   Eyes: Negative for discharge.  Respiratory: Negative for cough, chest tightness and wheezing.   Cardiovascular: Negative for chest pain.  Gastrointestinal: Negative for vomiting and abdominal pain.  Genitourinary: Negative for frequency and difficulty urinating.  Musculoskeletal: Negative for neck pain.  Allergic/Immunologic: Negative for environmental allergies and food allergies.  Neurological: Negative for weakness and headaches.  Psychiatric/Behavioral: Negative for behavioral problems and agitation.       Objective:   Physical Exam  Constitutional: She is oriented to person, place, and time. She  appears well-developed and well-nourished.  HENT:  Head: Normocephalic and atraumatic.  No adenopathy  Eyes: Pupils are equal, round, and reactive to light.  Neck: Normal range of motion. No thyromegaly present.  Cardiovascular: Normal rate, regular rhythm, normal heart sounds and intact distal pulses.   No murmur heard. Pulmonary/Chest: Effort normal and breath sounds normal. No respiratory distress. She has no wheezes.  Abdominal: Soft. Bowel sounds are normal. She exhibits no distension and no mass. There is no tenderness.  Musculoskeletal: She exhibits no edema or tenderness.  Lymphadenopathy:    She has no cervical adenopathy.  Neurological: She is alert and oriented to person, place, and time. She exhibits normal muscle tone.  Skin: Skin is warm and dry.  Psychiatric: She has a normal mood and affect. Her behavior is normal.   breast exam normal patient defers on pelvic exam  Patient also had blood pressure recheck medication refill She is on red rice yeast extract for her hyperlipidemia we will be checking lab work Patient due for bone density in May would like to do in 6 months Up-to-date on mammogram Up-to-date on colonoscopy will need one in 2017 patient is aware      Assessment & Plan:  Wellness/safety/dietary all discussed. Bone density in 6 months Blood pressure good when you medication Follow-up in 6 months Lab work ordered

## 2015-03-01 DIAGNOSIS — R5383 Other fatigue: Secondary | ICD-10-CM | POA: Diagnosis not present

## 2015-03-01 DIAGNOSIS — Z0000 Encounter for general adult medical examination without abnormal findings: Secondary | ICD-10-CM | POA: Diagnosis not present

## 2015-03-02 ENCOUNTER — Encounter: Payer: Self-pay | Admitting: Family Medicine

## 2015-03-02 LAB — LIPID PANEL
Chol/HDL Ratio: 4.9 ratio units — ABNORMAL HIGH (ref 0.0–4.4)
Cholesterol, Total: 197 mg/dL (ref 100–199)
HDL: 40 mg/dL (ref >39)
LDL Calculated: 130 mg/dL — ABNORMAL HIGH (ref 0–99)
TRIGLYCERIDES: 137 mg/dL (ref 0–149)
VLDL Cholesterol Cal: 27 mg/dL (ref 5–40)

## 2015-03-02 LAB — BASIC METABOLIC PANEL
BUN/Creatinine Ratio: 14 (ref 11–26)
BUN: 13 mg/dL (ref 8–27)
CO2: 24 mmol/L (ref 18–29)
CREATININE: 0.93 mg/dL (ref 0.57–1.00)
Calcium: 9.2 mg/dL (ref 8.7–10.3)
Chloride: 103 mmol/L (ref 97–108)
GFR, EST AFRICAN AMERICAN: 70 mL/min/{1.73_m2} (ref >59)
GFR, EST NON AFRICAN AMERICAN: 60 mL/min/{1.73_m2} (ref >59)
GLUCOSE: 96 mg/dL (ref 65–99)
POTASSIUM: 4.1 mmol/L (ref 3.5–5.2)
Sodium: 142 mmol/L (ref 134–144)

## 2015-03-02 LAB — HEPATIC FUNCTION PANEL
ALBUMIN: 4.1 g/dL (ref 3.5–4.8)
ALT: 8 IU/L (ref 0–32)
AST: 11 IU/L (ref 0–40)
Alkaline Phosphatase: 80 IU/L (ref 39–117)
Bilirubin Total: 0.5 mg/dL (ref 0.0–1.2)
Bilirubin, Direct: 0.11 mg/dL (ref 0.00–0.40)
Total Protein: 6.6 g/dL (ref 6.0–8.5)

## 2015-03-02 LAB — TSH: TSH: 3.81 u[IU]/mL (ref 0.450–4.500)

## 2015-03-11 ENCOUNTER — Ambulatory Visit (INDEPENDENT_AMBULATORY_CARE_PROVIDER_SITE_OTHER): Payer: Medicare Other | Admitting: Family Medicine

## 2015-03-11 ENCOUNTER — Encounter: Payer: Self-pay | Admitting: Family Medicine

## 2015-03-11 VITALS — BP 134/90 | Temp 98.2°F | Ht 68.0 in | Wt 199.1 lb

## 2015-03-11 DIAGNOSIS — J301 Allergic rhinitis due to pollen: Secondary | ICD-10-CM

## 2015-03-11 DIAGNOSIS — B9689 Other specified bacterial agents as the cause of diseases classified elsewhere: Secondary | ICD-10-CM

## 2015-03-11 DIAGNOSIS — J019 Acute sinusitis, unspecified: Secondary | ICD-10-CM | POA: Diagnosis not present

## 2015-03-11 MED ORDER — METHYLPREDNISOLONE ACETATE 40 MG/ML IJ SUSP
40.0000 mg | Freq: Once | INTRAMUSCULAR | Status: AC
  Administered 2015-03-11: 40 mg via INTRAMUSCULAR

## 2015-03-11 MED ORDER — AZITHROMYCIN 250 MG PO TABS: ORAL_TABLET | ORAL | Status: DC

## 2015-03-11 MED ORDER — OLOPATADINE HCL 0.1 % OP SOLN: 1.0000 [drp] | Freq: Two times a day (BID) | OPHTHALMIC | Status: DC

## 2015-03-11 NOTE — Progress Notes (Signed)
   Subjective:    Patient ID: [name], female    DOB: 1941/06/14, 74 y.o.   MRN: [id number]  Sore Throat  This is a new problem. The current episode started yesterday. Associated symptoms include congestion, coughing and vomiting. Pertinent negatives include no ear pain or shortness of breath. Treatments tried: Mucinex, Claritin, Flonase. The treatment provided no relief.   Patient would like to discuss medications: Claritin and flonase. Patient states no relief from either medications.Patient states no other concerns this visit.  Review of Systems  Constitutional: Negative for fever and activity change.  HENT: Positive for congestion and rhinorrhea. Negative for ear pain.   Eyes: Negative for discharge.  Respiratory: Positive for cough. Negative for shortness of breath and wheezing.   Cardiovascular: Negative for chest pain.  Gastrointestinal: Positive for vomiting.       Objective:   Physical Exam  Constitutional: She appears well-developed.  HENT:  Head: Normocephalic.  Nose: Nose normal.  Mouth/Throat: Oropharynx is clear and moist. No oropharyngeal exudate.  Neck: Neck supple.  Cardiovascular: Normal rate and normal heart sounds.   No murmur heard. Pulmonary/Chest: Effort normal and breath sounds normal. She has no wheezes.  Lymphadenopathy:    She has no cervical adenopathy.  Skin: Skin is warm and dry.  Nursing note and vitals reviewed.         Assessment & Plan:  Mild vertigo Allergic rhinitis Acute rhinosinusitis  Allergy eyedrops prescribed for the eyes because of allergic conjunctivitis, antibiotics prescribed for sinus infection, Depo-Medrol shot for allergic rhinitis, OTC steroid nasal spray and oral antihistamine loratadine or Allegra recommended. Patient to follow-up if progressive troubles.  Patient states that one time when she took prednisone her left eye swelled up in the doctor told her she was allergic to her I told her I doubted that this  was a true allergy because typically that would cause swelling in more than just 1 spot I told the patient that I thought shot of steroids benefits outweigh the risk patient consented to follow-up if ongoing troubles

## 2015-07-30 DIAGNOSIS — M1712 Unilateral primary osteoarthritis, left knee: Secondary | ICD-10-CM | POA: Diagnosis not present

## 2015-07-30 DIAGNOSIS — Z96651 Presence of right artificial knee joint: Secondary | ICD-10-CM | POA: Diagnosis not present

## 2015-07-30 DIAGNOSIS — Z471 Aftercare following joint replacement surgery: Secondary | ICD-10-CM | POA: Diagnosis not present

## 2015-08-17 ENCOUNTER — Ambulatory Visit (INDEPENDENT_AMBULATORY_CARE_PROVIDER_SITE_OTHER): Payer: Medicare Other | Admitting: Family Medicine

## 2015-08-17 ENCOUNTER — Encounter: Payer: Self-pay | Admitting: Family Medicine

## 2015-08-17 VITALS — BP 134/74 | Ht 68.0 in | Wt 196.1 lb

## 2015-08-17 DIAGNOSIS — I10 Essential (primary) hypertension: Secondary | ICD-10-CM | POA: Diagnosis not present

## 2015-08-17 DIAGNOSIS — K5909 Other constipation: Secondary | ICD-10-CM

## 2015-08-17 MED ORDER — BISOPROLOL-HYDROCHLOROTHIAZIDE 2.5-6.25 MG PO TABS: 1.0000 | ORAL_TABLET | Freq: Every day | ORAL | Status: DC

## 2015-08-17 MED ORDER — VERAPAMIL HCL ER 240 MG PO TBCR: EXTENDED_RELEASE_TABLET | ORAL | Status: DC

## 2015-08-17 NOTE — Progress Notes (Signed)
   Subjective:    Patient ID: [name], female    DOB: 09-14-1941, 74 y.o.   MRN: [id number]  Hypertension This is a chronic problem. The current episode started more than 1 year ago. Pertinent negatives include no chest pain. There are no compliance problems.    Patient would like to discuss irregular bowel movements.no blood,goes every 3 days, often takes a laxative No stool softners,eats veggies, not much water. Patient up-to-date on colonoscopy.   Review of Systems  Constitutional: Negative for activity change, appetite change and fatigue.  HENT: Negative for congestion.   Respiratory: Negative for cough.   Cardiovascular: Negative for chest pain.  Gastrointestinal: Negative for abdominal pain.  Endocrine: Negative for polydipsia and polyphagia.  Neurological: Negative for weakness.  Psychiatric/Behavioral: Negative for confusion.       Objective:   Physical Exam  Constitutional: She appears well-nourished. No distress.  Cardiovascular: Normal rate, regular rhythm and normal heart sounds.   No murmur heard. Pulmonary/Chest: Effort normal and breath sounds normal. No respiratory distress.  Musculoskeletal: She exhibits no edema.  Lymphadenopathy:    She has no cervical adenopathy.  Neurological: She is alert. She exhibits normal muscle tone.  Psychiatric: Her behavior is normal.  Vitals reviewed.         Assessment & Plan:  HTN decent control she is been controlled with verapamil and Ziac for several years. Has not had any problems with the medication. She was on amlodipine but she had swelling issues with this. We will continue current medication follow-up in 6 months  Constipation could well be related to verapamil she is given a try fibers in the diet. I will consider different regimen to see if she will have better success with bowel movements.

## 2015-08-17 NOTE — Patient Instructions (Signed)
Use generic Miralax 1 capfull in 1 glass of water a day  Also 2 oz prune juice a day and a bowl of raisn bran in the am   The goal  Is soft BMs   Please do heme cards as well

## 2015-08-18 ENCOUNTER — Telehealth: Payer: Self-pay | Admitting: Family Medicine

## 2015-08-18 NOTE — Telephone Encounter (Signed)
Nurse's-please let the patient know that I was studying /reviewing her chart. I believe that her constipation issue is being made worse by her verapamil. I would recommend stopping verapamil. I would recommend changing her Ziac to 5/6.25 which would be stronger than what she is currently on. I would also recommend that she follow-up within 3 weeks so we can check her blood pressure. Stopping the verapamil will help get rid of her constipation please call her in make these changes. Under allergies please put verapamil-side effect-constipation thank you

## 2015-08-19 NOTE — Telephone Encounter (Signed)
[name] 08/19/15

## 2015-08-20 NOTE — Telephone Encounter (Signed)
Patient states she would rather stick with her current bp meds and just try the other measures you mentioned for constipation at this time

## 2015-08-22 NOTE — Telephone Encounter (Signed)
Noted, if ongoing troubles with constipation will be changing medication

## 2015-08-26 ENCOUNTER — Other Ambulatory Visit: Payer: Self-pay | Admitting: *Deleted

## 2015-08-26 ENCOUNTER — Encounter: Payer: Self-pay | Admitting: Family Medicine

## 2015-08-26 DIAGNOSIS — K5909 Other constipation: Secondary | ICD-10-CM

## 2015-08-26 LAB — POC HEMOCCULT BLD/STL (HOME/3-CARD/SCREEN)
Card #2 Fecal Occult Blod, POC: NEGATIVE
FECAL OCCULT BLD: NEGATIVE
Fecal Occult Blood, POC: NEGATIVE

## 2015-09-10 DIAGNOSIS — M1712 Unilateral primary osteoarthritis, left knee: Secondary | ICD-10-CM | POA: Diagnosis not present

## 2015-09-20 DIAGNOSIS — M1712 Unilateral primary osteoarthritis, left knee: Secondary | ICD-10-CM | POA: Diagnosis not present

## 2015-09-23 ENCOUNTER — Telehealth: Payer: Self-pay | Admitting: Family Medicine

## 2015-09-23 NOTE — Telephone Encounter (Signed)
First-there is not a lot of evidence that lidocaine helps much with arthritis, I typically do not use lidocaine topical because of poor effectiveness #2 if using lidocaine typically most individuals use lidocaine patch because it controls the amount of lidocaine-overusage of lidocaine at one time can actually cause toxicity which can be deadly #3 many insurance companies do not pay for lidocaine. #4 there are other treatments considered to be more efficacious-that is helpful-Voltaren gel can be used and can be helpful.

## 2015-09-23 NOTE — Telephone Encounter (Signed)
[facility].  

## 2015-09-23 NOTE — Telephone Encounter (Signed)
Patient requesting Lidocaine Ointment 5% for Arthritis. May we fill?

## 2015-09-24 ENCOUNTER — Other Ambulatory Visit: Payer: Self-pay | Admitting: *Deleted

## 2015-09-24 MED ORDER — DICLOFENAC SODIUM 1 % TD GEL: 2.0000 g | Freq: Four times a day (QID) | TRANSDERMAL | Status: DC

## 2015-09-24 NOTE — Telephone Encounter (Signed)
Med sent to pharm. Pt notified.  

## 2015-09-24 NOTE — Telephone Encounter (Signed)
Discussed with pt. She wants to try the med you recommend. Pt would like a call back after med has been sent to pharm.

## 2015-09-24 NOTE — Telephone Encounter (Signed)
Full Taran gel applied 2 g to the affected area 4 times per day maximum 8 g per day. May give standard tube +4 refills.

## 2015-09-28 DIAGNOSIS — M1712 Unilateral primary osteoarthritis, left knee: Secondary | ICD-10-CM | POA: Diagnosis not present

## 2015-11-09 DIAGNOSIS — M1712 Unilateral primary osteoarthritis, left knee: Secondary | ICD-10-CM | POA: Diagnosis not present

## 2015-11-15 ENCOUNTER — Encounter: Payer: Self-pay | Admitting: Family Medicine

## 2015-11-15 ENCOUNTER — Ambulatory Visit (INDEPENDENT_AMBULATORY_CARE_PROVIDER_SITE_OTHER): Payer: Medicare Other | Admitting: Family Medicine

## 2015-11-15 VITALS — BP 128/82 | Ht 68.0 in | Wt 194.2 lb

## 2015-11-15 DIAGNOSIS — I10 Essential (primary) hypertension: Secondary | ICD-10-CM | POA: Diagnosis not present

## 2015-11-15 NOTE — Progress Notes (Signed)
   Subjective:    Patient ID: [name], female    DOB: May 10, 1941, 75 y.o.   MRN: [id number]  HPI  Patient arrives to discuss surgical clearance for knee surgery in May 2017. She has history of hypertension she takes her medicine as directed she denies any potential problems she states she is having total knee replacement coming up. Review of Systems She denies chest tightness pressure pain shortness breath nausea vomiting diarrhea swelling in the legs fever chills    Objective:   Physical Exam Lungs are clear no crackles heart regular no murmurs pulses normal BP good extremities no edema       Assessment & Plan:  Hypertension good control continue current measures check metabolic 7 I do feel that she would benefit from short-term rehabilitation after her knee replacement her home situation is such to where it would be difficult for her to go directly home from the hospital. We will send a copy of this to her orthopedics.  Surgical clearance-patient has medical surgical clearance but needs cardiac clearance via cardiology. This is mainly because of her age and she has not been able to be physically active over the past several months because of her severe arthritis therefore it is impossible to judge her cardiac condition. EKG looks good.

## 2015-11-16 ENCOUNTER — Encounter: Payer: Self-pay | Admitting: Family Medicine

## 2015-11-16 LAB — BASIC METABOLIC PANEL
BUN/Creatinine Ratio: 14 (ref 11–26)
BUN: 12 mg/dL (ref 8–27)
CALCIUM: 9.5 mg/dL (ref 8.7–10.3)
CHLORIDE: 100 mmol/L (ref 96–106)
CO2: 26 mmol/L (ref 18–29)
Creatinine, Ser: 0.87 mg/dL (ref 0.57–1.00)
GFR calc non Af Amer: 65 mL/min/{1.73_m2} (ref >59)
GFR, EST AFRICAN AMERICAN: 75 mL/min/{1.73_m2} (ref >59)
GLUCOSE: 92 mg/dL (ref 65–99)
POTASSIUM: 4.5 mmol/L (ref 3.5–5.2)
SODIUM: 142 mmol/L (ref 134–144)

## 2015-11-25 ENCOUNTER — Ambulatory Visit (HOSPITAL_COMMUNITY)
Admission: RE | Admit: 2015-11-25 | Discharge: 2015-11-25 | Disposition: A | Discharge status: Home / Self Care | Payer: Medicare Other | Source: Ambulatory Visit | Attending: Cardiovascular Disease | Admitting: Cardiovascular Disease

## 2015-11-25 ENCOUNTER — Ambulatory Visit (INDEPENDENT_AMBULATORY_CARE_PROVIDER_SITE_OTHER): Payer: Medicare Other | Admitting: Cardiovascular Disease

## 2015-11-25 ENCOUNTER — Encounter: Payer: Self-pay | Admitting: Cardiovascular Disease

## 2015-11-25 VITALS — BP 142/64 | HR 80 | Ht 68.5 in | Wt 190.0 lb

## 2015-11-25 DIAGNOSIS — Z01818 Encounter for other preprocedural examination: Secondary | ICD-10-CM

## 2015-11-25 DIAGNOSIS — I10 Essential (primary) hypertension: Secondary | ICD-10-CM

## 2015-11-25 DIAGNOSIS — I340 Nonrheumatic mitral (valve) insufficiency: Secondary | ICD-10-CM | POA: Diagnosis not present

## 2015-11-25 DIAGNOSIS — I5189 Other ill-defined heart diseases: Secondary | ICD-10-CM | POA: Diagnosis not present

## 2015-11-25 DIAGNOSIS — I358 Other nonrheumatic aortic valve disorders: Secondary | ICD-10-CM | POA: Insufficient documentation

## 2015-11-25 DIAGNOSIS — E785 Hyperlipidemia, unspecified: Secondary | ICD-10-CM | POA: Insufficient documentation

## 2015-11-25 DIAGNOSIS — I071 Rheumatic tricuspid insufficiency: Secondary | ICD-10-CM | POA: Insufficient documentation

## 2015-11-25 NOTE — Progress Notes (Signed)
Patient ID: [name], female   DOB: 07/02/1941, 75 y.o.   MRN: [id number]       CARDIOLOGY CONSULT NOTE  Patient ID: [name] MRN: [id number] DOB/AGE: 01-Aug-1941 75 y.o.  Admit date: (Not on file) Primary Physician Sallee Lange, MD  Reason for Consultation: preop clearance  HPI: The patient is a 75 year old woman with a history of hypertension who will be undergoing knee replacement surgery later this year. She has been referred for preoperative risk stratification given her inability to significantly exert herself due to severe left knee arthritis.  ECG performed in the office today demonstrates sinus rhythm with an isolated PVC and no ischemic ST segment or T-wave abnormalities.  She previously underwent right total knee replacement in 2000. She is trying to get scheduled for a left total knee replacement on 12/27/15. She denies a history of myocardial infarction. She was able to exert herself approximately one year ago and at that time had no problems with exertional chest pain or shortness of breath. She also denies leg swelling, orthopnea, palpitations, and paroxysmal nocturnal dyspnea. She has occasional dizziness and has a history of vertigo. She believes her mother and brother had coronary disease but developed it at a later age.  Allergies  Allergen Reactions  . Penicillins Hives    Current Outpatient Prescriptions  Medication Sig Dispense Refill  . bisoprolol-hydrochlorothiazide (ZIAC) 2.5-6.25 MG tablet Take 1 tablet by mouth daily. 90 tablet 1  . diphenhydrAMINE (BENADRYL) 25 MG tablet Take 1 tablet (25 mg total) by mouth every 6 (six) hours as needed for itching (Rash). 30 tablet 0  . EPINEPHrine (EPI-PEN) 0.3 mg/0.3 mL SOAJ Inject 0.3 mLs (0.3 mg total) into the muscle once. 1 Device 5  . famotidine (PEPCID) 20 MG tablet Take 1 tablet (20 mg total) by mouth 2 (two) times daily. (Patient taking differently: Take 20 mg by mouth 2 (two) times daily as needed.  ) 30 tablet 0  . fluticasone (FLONASE) 50 MCG/ACT nasal spray Place 2 sprays into both nostrils daily. 16 g 6  . loratadine (CLARITIN) 10 MG tablet Take 10 mg by mouth daily.    . verapamil (CALAN-SR) 240 MG CR tablet TAKE ONE (1) TABLET BY MOUTH EVERY DAY 90 tablet 1   No current facility-administered medications for this visit.    Past Medical History  Diagnosis Date  . Hypertension   . Arthritis   . Allergy   . Hyperlipidemia   . Angioedema   . Hx of echocardiogram 10/2005    normal    Past Surgical History  Procedure Laterality Date  . Abdominal hysterectomy    . Joint replacement    . Tubal ligation    . Colonoscopy  06/15/2011    Procedure: COLONOSCOPY;  Surgeon: Rogene Houston, MD;  Location: [facility];  Service: Endoscopy;  Laterality: N/A;  . Replacement total knee Right 2000  . Cholecystectomy    . Eye surgery      removed cyst from right eye in june 2015. sept 9th surgery for eye lid drop on both eyes    Social History   Social History  . Marital Status: Married    Spouse Name: N/A  . Number of Children: N/A  . Years of Education: N/A   Occupational History  . Not on file.   Social History Main Topics  . Smoking status: Never Smoker   . Smokeless tobacco: Not on file  . Alcohol Use: No  . Drug Use: No  .  Sexual Activity: Not on file   Other Topics Concern  . Not on file   Social History Narrative     No family history of premature CAD in 1st degree relatives.  Prior to Admission medications   Medication Sig Start Date End Date Taking? Authorizing Provider  bisoprolol-hydrochlorothiazide (ZIAC) 2.5-6.25 MG tablet Take 1 tablet by mouth daily. 08/17/15  Yes Kathyrn Drown, MD  diphenhydrAMINE (BENADRYL) 25 MG tablet Take 1 tablet (25 mg total) by mouth every 6 (six) hours as needed for itching (Rash). 11/04/14  Yes Noemi Chapel, MD  EPINEPHrine (EPI-PEN) 0.3 mg/0.3 mL SOAJ Inject 0.3 mLs (0.3 mg total) into the muscle once. 05/12/13  Yes  Nilda Simmer, NP  famotidine (PEPCID) 20 MG tablet Take 1 tablet (20 mg total) by mouth 2 (two) times daily. Patient taking differently: Take 20 mg by mouth 2 (two) times daily as needed.  11/04/14  Yes Noemi Chapel, MD  fluticasone (FLONASE) 50 MCG/ACT nasal spray Place 2 sprays into both nostrils daily. 09/28/14  Yes Nilda Simmer, NP  loratadine (CLARITIN) 10 MG tablet Take 10 mg by mouth daily.   Yes Historical Provider, MD  verapamil (CALAN-SR) 240 MG CR tablet TAKE ONE (1) TABLET BY MOUTH EVERY DAY 08/17/15  Yes Kathyrn Drown, MD     Review of systems complete and found to be negative unless listed above in HPI     Physical exam Blood pressure 142/64, pulse 80, height 5' 8.5" (1.74 m), weight 190 lb (86.183 kg), SpO2 96 %. General: NAD Neck: No JVD, no thyromegaly or thyroid nodule.  Lungs: Clear to auscultation bilaterally with normal respiratory effort. CV: Nondisplaced PMI. Regular rate and rhythm, normal S1/S2, no S3/S4, no murmur.  No peripheral edema.  No carotid bruit.  Normal pedal pulses.  Abdomen: Soft, nontender, no distention.  Skin: Intact without lesions or rashes.  Neurologic: Alert and oriented x 3.  Psych: Normal affect. Extremities: No clubbing or cyanosis.  HEENT: Normal.   ECG: Most recent ECG reviewed.  Labs:  No results found for: WBC, HGB, HCT, MCV, PLT No results for input(s): NA, K, CL, CO2, BUN, CREATININE, CALCIUM, PROT, BILITOT, ALKPHOS, ALT, AST, GLUCOSE in the last 168 hours.  Invalid input(s): LABALBU No results found for: CKTOTAL, CKMB, CKMBINDEX, TROPONINI  Lab Results  Component Value Date   CHOL 197 03/01/2015   CHOL 178 01/29/2014   CHOL 214* 03/06/2013   Lab Results  Component Value Date   HDL 40 03/01/2015   HDL 45 01/29/2014   HDL 49 03/06/2013   Lab Results  Component Value Date   LDLCALC 130* 03/01/2015   LDLCALC 117* 01/29/2014   LDLCALC 145* 03/06/2013   Lab Results  Component Value Date   TRIG 137  03/01/2015   TRIG 78 01/29/2014   TRIG 99 03/06/2013   Lab Results  Component Value Date   CHOLHDL 4.9* 03/01/2015   CHOLHDL 4.0 01/29/2014   CHOLHDL 4.4 03/06/2013   No results found for: LDLDIRECT       Studies: No results found.  ASSESSMENT AND PLAN:  1. Preoperative risk stratification: Unable to quantify exertional capacity but prior to progressive left knee arthritis, she had no exertional symptoms attributable to coronary disease. Risk factors include hypertension. I will order a 2-D echocardiogram with Doppler to evaluate cardiac structure, function, and regional wall motion.  2. Essential HTN: Mildly elevated in the context of severe knee pain. No changes to therapy.  Dispo: f/u to be  determined.   Signed: Kate Sable, M.D., F.A.C.C.  11/25/2015, 9:27 AM

## 2015-11-25 NOTE — Patient Instructions (Signed)
Your physician recommends that you schedule a follow-up appointment in:  To be determined after echo     Your physician recommends that you continue on your current medications as directed. Please refer to the Current Medication list given to you today.    Your physician has requested that you have an echocardiogram. Echocardiography is a painless test that uses sound waves to create images of your heart. It provides your doctor with information about the size and shape of your heart and how well your heart's chambers and valves are working. This procedure takes approximately one hour. There are no restrictions for this procedure.      Thank you for choosing [facility] !         

## 2015-11-29 ENCOUNTER — Ambulatory Visit: Payer: Self-pay | Admitting: Orthopedic Surgery

## 2015-11-29 DIAGNOSIS — M1712 Unilateral primary osteoarthritis, left knee: Secondary | ICD-10-CM | POA: Diagnosis not present

## 2015-11-29 NOTE — Progress Notes (Signed)
Preoperative surgical orders have been place into the Epic hospital system for [name] on 11/29/2015, 10:54 AM  by Mickel Crow for surgery on 12-27-15.  Preop Total Knee orders including Experal, IV Tylenol, and IV Decadron as long as there are no contraindications to the above medications. Arlee Muslim, PA-C

## 2015-11-30 ENCOUNTER — Telehealth: Payer: Self-pay | Admitting: *Deleted

## 2015-11-30 NOTE — Telephone Encounter (Signed)
Please review pt's ov and tests. Had echo. [place] [facility] needs her results.

## 2015-12-01 NOTE — Telephone Encounter (Signed)
Scott, I think she can safely proceed.  Jamesetta So

## 2015-12-01 NOTE — Telephone Encounter (Signed)
Hi Caterin, Placide saw you in February for preoperative evaluation. Her surgery is most likely early May but possibly sooner if there is a cancellation. You completed a echo which looked good. Your progress note stated that her presurgical clearance was pending. In your opinion is she at the point where she is cleared for knee replacement surgery? If possible please let me know or send me updated clinical documentation that gives [place] the go ahead so I can notify her surgeon. Thank you-Keylan Costabile

## 2015-12-07 ENCOUNTER — Encounter: Payer: Self-pay | Admitting: Family Medicine

## 2015-12-07 NOTE — Telephone Encounter (Signed)
Cardiologist has improved surgery. Letter sent to the patient's orthopedics

## 2015-12-20 ENCOUNTER — Ambulatory Visit (INDEPENDENT_AMBULATORY_CARE_PROVIDER_SITE_OTHER): Payer: Medicare Other | Admitting: Nurse Practitioner

## 2015-12-20 ENCOUNTER — Encounter: Payer: Self-pay | Admitting: Nurse Practitioner

## 2015-12-20 VITALS — BP 130/70 | Temp 98.3°F | Ht 68.0 in | Wt 193.0 lb

## 2015-12-20 DIAGNOSIS — J329 Chronic sinusitis, unspecified: Secondary | ICD-10-CM

## 2015-12-20 DIAGNOSIS — J310 Chronic rhinitis: Secondary | ICD-10-CM

## 2015-12-20 MED ORDER — AZITHROMYCIN 250 MG PO TABS: ORAL_TABLET | ORAL | Status: DC

## 2015-12-20 MED ORDER — HYDROCODONE-HOMATROPINE 5-1.5 MG/5ML PO SYRP: 5.0000 mL | ORAL_SOLUTION | ORAL | Status: DC | PRN

## 2015-12-20 NOTE — Progress Notes (Signed)
Subjective:  Presents for complaints of cough and congestion for the past 3 days. No fever. Sore throat mainly with cough. Sneezing. Occasional mild sinus headache. Runny nose. Spells of coughing producing green sputum at times. No wheezing. No ear pain. No vomiting diarrhea or abdominal pain. Taking fluids well. Voiding normal limit. Patient is scheduled for orthopedic surgery in one week. Will be going for preop tomorrow.  Objective:   BP 130/70 mmHg  Temp(Src) 98.3 F (36.8 C) (Oral)  Ht 5\' 8"  (1.727 m)  Wt 193 lb (87.544 kg)  BMI 29.35 kg/m2 NAD. Alert, oriented. TMs clear effusion, more on the left. Pharynx moderate erythema with green PND noted. Neck supple with mild soft anterior adenopathy. Lungs clear. Heart regular rate rhythm.  Assessment: Rhinosinusitis  Plan:  Meds ordered this encounter  Medications  . azithromycin (ZITHROMAX Z-PAK) 250 MG tablet    Sig: Take 2 tablets (500 mg) on  Day 1,  followed by 1 tablet (250 mg) once daily on Days 2 through 5.    Dispense:  6 each    Refill:  0    Order Specific Question:  Supervising Provider    Answer:  Mikey Kirschner [[id number]]  . HYDROcodone-homatropine (HYCODAN) 5-1.5 MG/5ML syrup    Sig: Take 5 mLs by mouth every 4 (four) hours as needed.    Dispense:  120 mL    Refill:  0    Order Specific Question:  Supervising Provider    Answer:  Mikey Kirschner [[id number]]   Patient is asking for a shot today, need to avoid Rocephin due to penicillin allergy and steroids due to upcoming surgery. Also avoid Levaquin because of orthopedic surgery. Prescription sent in for Z-Pak. Mucinex DM as directed. Hycodan syrup for nighttime cough. Call back by the end of the week if no improvement, sooner if worse.

## 2015-12-20 NOTE — Patient Instructions (Signed)
[name]  12/20/2015   Your procedure is scheduled on: 12/27/2015    Report to [facility] Main  Entrance take [place]  elevators to 3rd floor to  [place] at     [place] AM.  Call this number if you have problems the morning of surgery [phone]   Remember: ONLY 1 PERSON MAY GO WITH YOU TO SHORT STAY TO GET  READY MORNING OF [place].  Do not eat food or drink liquids :After Midnight.     Take these medicines the morning of surgery with A SIP OF WATER:   Eyedrops as needed, Verapamil ( [place])                                You may not have any metal on your body including hair pins and              piercings  Do not wear jewelry, make-up, lotions, powders or perfumes, deodorant             Do not wear nail polish.  Do not shave  48 hours prior to surgery.            Do not bring valuables to the hospital. [place].  Contacts, dentures or bridgework may not be worn into surgery.  Leave suitcase in the car. After surgery it may be brought to your room.        Special Instructions: coughing and deep breathing exercises, leg exercises               Please read over the following fact sheets you were given: _____________________________________________________________________             [facility] - Preparing for Surgery Before surgery, you can play an important role.  Because skin is not sterile, your skin needs to be as free of germs as possible.  You can reduce the number of germs on your skin by washing with CHG (chlorahexidine gluconate) soap before surgery.  CHG is an antiseptic cleaner which kills germs and bonds with the skin to continue killing germs even after washing. Please DO NOT use if you have an allergy to CHG or antibacterial soaps.  If your skin becomes reddened/irritated stop using the CHG and inform your nurse when you arrive at Short Stay. Do not shave (including legs  and underarms) for at least 48 hours prior to the first CHG shower.  You may shave your face/neck. Please follow these instructions carefully:  1.  Shower with CHG Soap the night before surgery and the  morning of Surgery.  2.  If you choose to wash your hair, wash your hair first as usual with your  normal  shampoo.  3.  After you shampoo, rinse your hair and body thoroughly to remove the  shampoo.                           4.  Use CHG as you would any other liquid soap.  You can apply chg directly  to the skin and wash  Gently with a scrungie or clean washcloth.  5.  Apply the CHG Soap to your body ONLY FROM THE NECK DOWN.   Do not use on face/ open                           Wound or open sores. Avoid contact with eyes, ears mouth and genitals (private parts).                       Wash face,  Genitals (private parts) with your normal soap.             6.  Wash thoroughly, paying special attention to the area where your surgery  will be performed.  7.  Thoroughly rinse your body with warm water from the neck down.  8.  DO NOT shower/wash with your normal soap after using and rinsing off  the CHG Soap.                9.  Pat yourself dry with a clean towel.            10.  Wear clean pajamas.            11.  Place clean sheets on your bed the night of your first shower and do not  sleep with pets. Day of Surgery : Do not apply any lotions/deodorants the morning of surgery.  Please wear clean clothes to the hospital/surgery center.  FAILURE TO FOLLOW THESE INSTRUCTIONS MAY RESULT IN THE CANCELLATION OF YOUR SURGERY PATIENT SIGNATURE_________________________________  NURSE SIGNATURE__________________________________  ________________________________________________________________________  WHAT IS A BLOOD TRANSFUSION? Blood Transfusion Information  A transfusion is the replacement of blood or some of its parts. Blood is made up of multiple cells which provide different  functions.  Red blood cells carry oxygen and are used for blood loss replacement.  White blood cells fight against infection.  Platelets control bleeding.  Plasma helps clot blood.  Other blood products are available for specialized needs, such as hemophilia or other clotting disorders. BEFORE THE TRANSFUSION  Who gives blood for transfusions?   Healthy volunteers who are fully evaluated to make sure their blood is safe. This is blood bank blood. Transfusion therapy is the safest it has ever been in the practice of medicine. Before blood is taken from a donor, a complete history is taken to make sure that person has no history of diseases nor engages in risky social behavior (examples are intravenous drug use or sexual activity with multiple partners). The donor's travel history is screened to minimize risk of transmitting infections, such as malaria. The donated blood is tested for signs of infectious diseases, such as HIV and hepatitis. The blood is then tested to be sure it is compatible with you in order to minimize the chance of a transfusion reaction. If you or a relative donates blood, this is often done in anticipation of surgery and is not appropriate for emergency situations. It takes many days to process the donated blood. RISKS AND COMPLICATIONS Although transfusion therapy is very safe and saves many lives, the main dangers of transfusion include:  1. Getting an infectious disease. 2. Developing a transfusion reaction. This is an allergic reaction to something in the blood you were given. Every precaution is taken to prevent this. The decision to have a blood transfusion has been considered carefully by your caregiver before blood is given. Blood is not given unless the benefits outweigh  the risks. AFTER THE TRANSFUSION  Right after receiving a blood transfusion, you will usually feel much better and more energetic. This is especially true if your red blood cells have gotten low  (anemic). The transfusion raises the level of the red blood cells which carry oxygen, and this usually causes an energy increase.  The nurse administering the transfusion will monitor you carefully for complications. HOME CARE INSTRUCTIONS  No special instructions are needed after a transfusion. You may find your energy is better. Speak with your caregiver about any limitations on activity for underlying diseases you may have. SEEK MEDICAL CARE IF:   Your condition is not improving after your transfusion.  You develop redness or irritation at the intravenous (IV) site. SEEK IMMEDIATE MEDICAL CARE IF:  Any of the following symptoms occur over the next 12 hours:  Shaking chills.  You have a temperature by mouth above 102 F (38.9 C), not controlled by medicine.  Chest, back, or muscle pain.  People around you feel you are not acting correctly or are confused.  Shortness of breath or difficulty breathing.  Dizziness and fainting.  You get a rash or develop hives.  You have a decrease in urine output.  Your urine turns a dark color or changes to pink, red, or brown. Any of the following symptoms occur over the next 10 days:  You have a temperature by mouth above 102 F (38.9 C), not controlled by medicine.  Shortness of breath.  Weakness after normal activity.  The white part of the eye turns yellow (jaundice).  You have a decrease in the amount of urine or are urinating less often.  Your urine turns a dark color or changes to pink, red, or brown. Document Released: 10/06/2000 Document Revised: 01/01/2012 Document Reviewed: 05/25/2008 ExitCare Patient Information 2014 [place].  _______________________________________________________________________  Incentive Spirometer  An incentive spirometer is a tool that can help keep your lungs clear and active. This tool measures how well you are filling your lungs with each breath. Taking long deep breaths may help  reverse or decrease the chance of developing breathing (pulmonary) problems (especially infection) following:  A long period of time when you are unable to move or be active. BEFORE THE PROCEDURE   If the spirometer includes an indicator to show your best effort, your nurse or respiratory therapist will set it to a desired goal.  If possible, sit up straight or lean slightly forward. Try not to slouch.  Hold the incentive spirometer in an upright position. INSTRUCTIONS FOR USE  3. Sit on the edge of your bed if possible, or sit up as far as you can in bed or on a chair. 4. Hold the incentive spirometer in an upright position. 5. Breathe out normally. 6. Place the mouthpiece in your mouth and seal your lips tightly around it. 7. Breathe in slowly and as deeply as possible, raising the piston or the ball toward the top of the column. 8. Hold your breath for 3-5 seconds or for as long as possible. Allow the piston or ball to fall to the bottom of the column. 9. Remove the mouthpiece from your mouth and breathe out normally. 10. Rest for a few seconds and repeat Steps 1 through 7 at least 10 times every 1-2 hours when you are awake. Take your time and take a few normal breaths between deep breaths. 11. The spirometer may include an indicator to show your best effort. Use the indicator as a goal to work  toward during each repetition. 12. After each set of 10 deep breaths, practice coughing to be sure your lungs are clear. If you have an incision (the cut made at the time of surgery), support your incision when coughing by placing a pillow or rolled up towels firmly against it. Once you are able to get out of bed, walk around indoors and cough well. You may stop using the incentive spirometer when instructed by your caregiver.  RISKS AND COMPLICATIONS  Take your time so you do not get dizzy or light-headed.  If you are in pain, you may need to take or ask for pain medication before doing  incentive spirometry. It is harder to take a deep breath if you are having pain. AFTER USE  Rest and breathe slowly and easily.  It can be helpful to keep track of a log of your progress. Your caregiver can provide you with a simple table to help with this. If you are using the spirometer at home, follow these instructions: [place] IF:   You are having difficultly using the spirometer.  You have trouble using the spirometer as often as instructed.  Your pain medication is not giving enough relief while using the spirometer.  You develop fever of 100.5 F (38.1 C) or higher. SEEK IMMEDIATE MEDICAL CARE IF:   You cough up bloody sputum that had not been present before.  You develop fever of 102 F (38.9 C) or greater.  You develop worsening pain at or near the incision site. MAKE SURE YOU:   Understand these instructions.  Will watch your condition.  Will get help right away if you are not doing well or get worse. Document Released: 02/19/2007 Document Revised: 01/01/2012 Document Reviewed: 04/22/2007 [facility] Patient Information 2014 [place], [place].   ________________________________________________________________________

## 2015-12-21 ENCOUNTER — Encounter (HOSPITAL_COMMUNITY)
Admission: RE | Admit: 2015-12-21 | Discharge: 2015-12-21 | Disposition: A | Discharge status: Home / Self Care | Payer: Medicare Other | Source: Ambulatory Visit | Attending: Orthopedic Surgery | Admitting: Orthopedic Surgery

## 2015-12-21 ENCOUNTER — Encounter (HOSPITAL_COMMUNITY): Payer: Self-pay

## 2015-12-21 DIAGNOSIS — Z01812 Encounter for preprocedural laboratory examination: Secondary | ICD-10-CM | POA: Insufficient documentation

## 2015-12-21 DIAGNOSIS — Z0183 Encounter for blood typing: Secondary | ICD-10-CM | POA: Insufficient documentation

## 2015-12-21 DIAGNOSIS — M1712 Unilateral primary osteoarthritis, left knee: Secondary | ICD-10-CM | POA: Diagnosis not present

## 2015-12-21 HISTORY — DX: Headache: R51

## 2015-12-21 HISTORY — DX: Disorder of thyroid, unspecified: E07.9

## 2015-12-21 HISTORY — DX: Headache, unspecified: R51.9

## 2015-12-21 HISTORY — DX: Gastro-esophageal reflux disease without esophagitis: K21.9

## 2015-12-21 LAB — COMPREHENSIVE METABOLIC PANEL
ALBUMIN: 4.2 g/dL (ref 3.5–5.0)
ALT: 13 U/L — ABNORMAL LOW (ref 14–54)
ANION GAP: 10 (ref 5–15)
AST: 17 U/L (ref 15–41)
Alkaline Phosphatase: 87 U/L (ref 38–126)
BILIRUBIN TOTAL: 0.7 mg/dL (ref 0.3–1.2)
BUN: 13 mg/dL (ref 6–20)
CO2: 24 mmol/L (ref 22–32)
Calcium: 9 mg/dL (ref 8.9–10.3)
Chloride: 107 mmol/L (ref 101–111)
Creatinine, Ser: 0.86 mg/dL (ref 0.44–1.00)
GFR calc non Af Amer: >60 mL/min (ref >60)
GLUCOSE: 115 mg/dL — AB (ref 65–99)
POTASSIUM: 3.9 mmol/L (ref 3.5–5.1)
Sodium: 141 mmol/L (ref 135–145)
TOTAL PROTEIN: 7.4 g/dL (ref 6.5–8.1)

## 2015-12-21 LAB — URINALYSIS, ROUTINE W REFLEX MICROSCOPIC
Bilirubin Urine: NEGATIVE
GLUCOSE, UA: NEGATIVE mg/dL
HGB URINE DIPSTICK: NEGATIVE
Ketones, ur: NEGATIVE mg/dL
Nitrite: NEGATIVE
PH: 5 (ref 5.0–8.0)
Protein, ur: NEGATIVE mg/dL
SPECIFIC GRAVITY, URINE: 1.018 (ref 1.005–1.030)

## 2015-12-21 LAB — PROTIME-INR
INR: 0.97 (ref 0.00–1.49)
PROTHROMBIN TIME: 13.1 s (ref 11.6–15.2)

## 2015-12-21 LAB — CBC
HEMATOCRIT: 42.8 % (ref 36.0–46.0)
Hemoglobin: 13.7 g/dL (ref 12.0–15.0)
MCH: 29.7 pg (ref 26.0–34.0)
MCHC: 32 g/dL (ref 30.0–36.0)
MCV: 92.8 fL (ref 78.0–100.0)
Platelets: 213 10*3/uL (ref 150–400)
RBC: 4.61 MIL/uL (ref 3.87–5.11)
RDW: 13.1 % (ref 11.5–15.5)
WBC: 5.9 10*3/uL (ref 4.0–10.5)

## 2015-12-21 LAB — URINE MICROSCOPIC-ADD ON
Bacteria, UA: NONE SEEN
RBC / HPF: NONE SEEN RBC/hpf (ref 0–5)

## 2015-12-21 LAB — SURGICAL PCR SCREEN
MRSA, PCR: NEGATIVE
STAPHYLOCOCCUS AUREUS: NEGATIVE

## 2015-12-21 LAB — APTT: aPTT: 27 seconds (ref 24–37)

## 2015-12-21 LAB — ABO/RH: ABO/RH(D): B POS

## 2015-12-21 NOTE — Progress Notes (Signed)
Patient placed on Zpack for sinus issue on 12/20/15 by PCP.  Patient instructed if does not improve needs to contact PCp and Dr Wynelle Link.  Patient voiced understanding.

## 2015-12-21 NOTE — Progress Notes (Signed)
Surgical clearance- Dr Wolfgang Phoenix- 11/15/15 on chart  Cardiac cleasrance- 11/25/15- LOV- Cardiology- EPIC - preop clearance in note ECHO- 11/25/15- EPIC  EKG-11/25/15- EPIC

## 2015-12-21 NOTE — Progress Notes (Signed)
U/A and micro results done 12/21/15 faxed via EPIC to Dr Wynelle Link.

## 2015-12-24 NOTE — H&P (Signed)
TOTAL KNEE ADMISSION H&P  Patient is being admitted for left total knee arthroplasty.  Subjective:  Chief Complaint:left knee pain.  HPI: [name], 75 y.o. female, has a history of pain and functional disability in the left knee due to arthritis and has failed non-surgical conservative treatments for greater than 12 weeks to includeNSAID's and/or analgesics, corticosteriod injections, viscosupplementation injections, flexibility and strengthening excercises and activity modification.  Onset of symptoms was gradual, starting >10 years ago with gradually worsening course since that time. The patient noted no past surgery on the left knee(s).  Patient currently rates pain in the left knee(s) at 8 out of 10 with activity. Patient has night pain, worsening of pain with activity and weight bearing, pain that interferes with activities of daily living, pain with passive range of motion, crepitus and joint swelling.  Patient has evidence of periarticular osteophytes and joint space narrowing by imaging studies. There is no active infection.  Patient Active Problem List   Diagnosis Date Noted  . Screening for osteoporosis 02/25/2014  . 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 Allergic reaction 05/13/2013  . Hyperlipemia 04/24/2013  . Hypertension 04/24/2013   Past Medical History  Diagnosis Date  . Hypertension   . Arthritis   . Allergy   . Hyperlipidemia   . Angioedema   . Hx of echocardiogram 10/2005    normal  . GERD (gastroesophageal reflux disease)   . Headache   . Thyroid disease     treated with radiation in 1980s     Past Surgical History  Procedure Laterality Date  . Abdominal hysterectomy    . Joint replacement    . Tubal ligation    . Colonoscopy  06/15/2011    Procedure: COLONOSCOPY;  Surgeon: Rogene Houston, MD;  Location: [facility];  Service: Endoscopy;  Laterality: N/A;  . Replacement total knee Right 2000  .  Cholecystectomy    . Eye surgery      removed cyst from right eye in june 2015. sept 9th surgery for eye lid drop on both eyes      Current outpatient prescriptions:  .  bisoprolol-hydrochlorothiazide (ZIAC) 2.5-6.25 MG tablet, Take 1 tablet by mouth daily., Disp: 90 tablet, Rfl: 1 .  diphenhydrAMINE (BENADRYL) 25 MG tablet, Take 1 tablet (25 mg total) by mouth every 6 (six) hours as needed for itching (Rash). (Patient taking differently: Take 25 mg by mouth at bedtime. ), Disp: 30 tablet, Rfl: 0 .  EPINEPHrine (EPI-PEN) 0.3 mg/0.3 mL SOAJ, Inject 0.3 mLs (0.3 mg total) into the muscle once., Disp: 1 Device, Rfl: 5 .  ketotifen (ALAWAY) 0.025 % ophthalmic solution, Place 1 drop into both eyes 2 (two) times daily as needed (for itchy eyes). , Disp: , Rfl:  .  verapamil (CALAN-SR) 240 MG CR tablet, TAKE ONE (1) TABLET BY MOUTH EVERY DAY (Patient taking differently: Take 240 mg by mouth daily. ), Disp: 90 tablet, Rfl: 1 .  azithromycin (ZITHROMAX Z-PAK) 250 MG tablet, Take 2 tablets (500 mg) on  Day 1,  followed by 1 tablet (250 mg) once daily on Days 2 through 5. (Patient taking differently: Take 2 tablets (500 mg) on  Day 1,  followed by 1 tablet (250 mg) once daily on Days 2 through 5.  Started on 12/20/15), Disp: 6 each, Rfl: 0 .  HYDROcodone-homatropine (HYCODAN) 5-1.5 MG/5ML syrup, Take 5 mLs by mouth every 4 (four) hours as needed., Disp: 120 mL, Rfl: 0  Allergies  Allergen Reactions  . Other     Peanut butter- causes tongue to swell   . Penicillins Hives    Has patient had a PCN reaction causing immediate rash, facial/tongue/throat swelling, SOB or lightheadedness with hypotension: delayed reaction Has patient had a PCN reaction causing severe rash involving mucus membranes or skin necrosis: no Has patient had a PCN reaction that required hospitalization no Has patient had a PCN reaction occurring within the last 10 years: no If all of the above answers are "NO", then may proceed with  Cephalosporin use.  [name] Allergy] Other (See Comments)    Lips swelling    Social History  Substance Use Topics  . Smoking status: Never Smoker   . Smokeless tobacco: Never Used  . Alcohol Use: No    Family History  Problem Relation Age of Onset  . Hypertension Mother   . Coronary artery disease Mother   . Hypertension Father   . Coronary artery disease Father   . Heart failure Sister   . Hypertension Sister   . Cancer Brother   . Diabetes Brother      Review of Systems  Constitutional: Negative.   HENT: Negative.   Eyes: Negative.   Respiratory: Positive for cough. Negative for hemoptysis, sputum production, shortness of breath and wheezing.   Cardiovascular: Negative.   Gastrointestinal: Negative.   Genitourinary: Negative.   Musculoskeletal: Positive for myalgias and joint pain. Negative for back pain, falls and neck pain.  Skin: Negative.   Neurological: Negative.   Endo/Heme/Allergies: Positive for environmental allergies. Negative for polydipsia. Does not bruise/bleed easily.  Psychiatric/Behavioral: Negative.     Objective:  Physical Exam  Constitutional: She is oriented to person, place, and time. She appears well-developed and well-nourished.  Overweight  HENT:  Head: Normocephalic and atraumatic.  Right Ear: External ear normal.  Left Ear: External ear normal.  Nose: Nose normal.  Eyes: Conjunctivae and EOM are normal.  Neck: Normal range of motion. Neck supple.  Cardiovascular: Normal rate, regular rhythm, normal heart sounds and intact distal pulses.   No murmur heard. Respiratory: Effort normal and breath sounds normal. No respiratory distress. She has no wheezes.  GI: Soft. Bowel sounds are normal. She exhibits no distension. There is no tenderness.  Musculoskeletal:       Right hip: Normal.       Left hip: Normal.  Her left knee shows no effusion. Her range is about 5 to 125. There is marked crepitus on range of motion of knee. She is  tender diffusely throughout the knee. There is no instability noted.  Neurological: She is alert and oriented to person, place, and time. She has normal strength and normal reflexes. No sensory deficit.  Skin: No rash noted. She is not diaphoretic. No erythema.  Psychiatric: She has a normal mood and affect. Her behavior is normal.    Vitals  Weight: 195 lb Height: 68.5in Body Surface Area: 2.03 m Body Mass Index: 29.22 kg/m  Pulse: 72 (Regular)  BP: 128/76 (Sitting, Left Arm, Standard)  Imaging Review Plain radiographs demonstrate severe degenerative joint disease of the left knee(s). The overall alignment ismild valgus. The bone quality appears to be good for age and reported activity level.  Assessment/Plan:  End stage primary osteoarthritis, left knee   The patient history, physical examination, clinical judgment of the provider and imaging studies are consistent with end stage degenerative joint disease of the left knee(s) and total knee arthroplasty is deemed medically necessary.  The treatment options including medical management, injection therapy arthroscopy and arthroplasty were discussed at length. The risks and benefits of total knee arthroplasty were presented and reviewed. The risks due to aseptic loosening, infection, stiffness, patella tracking problems, thromboembolic complications and other imponderables were discussed. The patient acknowledged the explanation, agreed to proceed with the plan and consent was signed. Patient is being admitted for inpatient treatment for surgery, pain control, PT, OT, prophylactic antibiotics, VTE prophylaxis, progressive ambulation and ADL's and discharge planning. The patient is planning to be discharged home with home health services    PCP: Dr. Fonnie Mu, PA-C

## 2015-12-27 ENCOUNTER — Inpatient Hospital Stay (HOSPITAL_COMMUNITY): Payer: Medicare Other | Admitting: Certified Registered Nurse Anesthetist

## 2015-12-27 ENCOUNTER — Encounter (HOSPITAL_COMMUNITY)
Admission: RE | Disposition: A | Discharge status: Home Health | Payer: Self-pay | Source: Ambulatory Visit | Attending: Orthopedic Surgery

## 2015-12-27 ENCOUNTER — Encounter (HOSPITAL_COMMUNITY): Payer: Self-pay | Admitting: *Deleted

## 2015-12-27 ENCOUNTER — Inpatient Hospital Stay (HOSPITAL_COMMUNITY)
Admission: RE | Admit: 2015-12-27 | Discharge: 2015-12-29 | DRG: 470 | Disposition: A | Discharge status: Home Health | Payer: Medicare Other | Source: Ambulatory Visit | Attending: Orthopedic Surgery | Admitting: Orthopedic Surgery

## 2015-12-27 DIAGNOSIS — Z6829 Body mass index (BMI) 29.0-29.9, adult: Secondary | ICD-10-CM | POA: Diagnosis not present

## 2015-12-27 DIAGNOSIS — Z01812 Encounter for preprocedural laboratory examination: Secondary | ICD-10-CM | POA: Diagnosis not present

## 2015-12-27 DIAGNOSIS — M1710 Unilateral primary osteoarthritis, unspecified knee: Secondary | ICD-10-CM | POA: Diagnosis present

## 2015-12-27 DIAGNOSIS — I10 Essential (primary) hypertension: Secondary | ICD-10-CM | POA: Diagnosis not present

## 2015-12-27 DIAGNOSIS — Z79899 Other long term (current) drug therapy: Secondary | ICD-10-CM | POA: Diagnosis not present

## 2015-12-27 DIAGNOSIS — E663 Overweight: Secondary | ICD-10-CM | POA: Diagnosis present

## 2015-12-27 DIAGNOSIS — M25562 Pain in left knee: Secondary | ICD-10-CM | POA: Diagnosis present

## 2015-12-27 DIAGNOSIS — M179 Osteoarthritis of knee, unspecified: Secondary | ICD-10-CM | POA: Diagnosis present

## 2015-12-27 DIAGNOSIS — M1712 Unilateral primary osteoarthritis, left knee: Principal | ICD-10-CM | POA: Diagnosis present

## 2015-12-27 HISTORY — PX: TOTAL KNEE ARTHROPLASTY: SHX125

## 2015-12-27 LAB — TYPE AND SCREEN
ABO/RH(D): B POS
ANTIBODY SCREEN: NEGATIVE

## 2015-12-27 SURGERY — ARTHROPLASTY, KNEE, TOTAL
Anesthesia: Spinal | Site: Knee | Laterality: Left

## 2015-12-27 MED ORDER — DIPHENHYDRAMINE HCL 12.5 MG/5ML PO ELIX: 12.5000 mg | ORAL_SOLUTION | ORAL | Status: DC | PRN

## 2015-12-27 MED ORDER — MORPHINE SULFATE (PF) 2 MG/ML IV SOLN
1.0000 mg | INTRAVENOUS | Status: DC | PRN
Start: 2015-12-27 — End: 2015-12-29
  Administered 2015-12-27: 1 mg via INTRAVENOUS
  Filled 2015-12-27: qty 1

## 2015-12-27 MED ORDER — POLYETHYLENE GLYCOL 3350 17 G PO PACK: 17.0000 g | PACK | Freq: Every day | ORAL | Status: DC | PRN

## 2015-12-27 MED ORDER — ACETAMINOPHEN 10 MG/ML IV SOLN
1000.0000 mg | Freq: Once | INTRAVENOUS | Status: AC
  Administered 2015-12-27: 1000 mg via INTRAVENOUS
  Filled 2015-12-27: qty 100

## 2015-12-27 MED ORDER — ACETAMINOPHEN 325 MG PO TABS: 325.0000 mg | ORAL_TABLET | ORAL | Status: DC | PRN

## 2015-12-27 MED ORDER — SODIUM CHLORIDE 0.9 % IV SOLN
INTRAVENOUS | Status: DC
  Administered 2015-12-27: 17:00:00 via INTRAVENOUS

## 2015-12-27 MED ORDER — OXYCODONE HCL 5 MG PO TABS
5.0000 mg | ORAL_TABLET | ORAL | Status: DC | PRN
  Administered 2015-12-27 – 2015-12-28 (×3): 5 mg via ORAL
  Administered 2015-12-28: 10 mg via ORAL
  Administered 2015-12-28 (×3): 5 mg via ORAL
  Administered 2015-12-28 – 2015-12-29 (×5): 10 mg via ORAL
  Filled 2015-12-27: qty 1
  Filled 2015-12-27 (×3): qty 2
  Filled 2015-12-27: qty 1
  Filled 2015-12-27: qty 2
  Filled 2015-12-27 (×2): qty 1
  Filled 2015-12-27 (×2): qty 2
  Filled 2015-12-27: qty 1
  Filled 2015-12-27: qty 2

## 2015-12-27 MED ORDER — METOCLOPRAMIDE HCL 5 MG/ML IJ SOLN: 5.0000 mg | Freq: Three times a day (TID) | INTRAMUSCULAR | Status: DC | PRN

## 2015-12-27 MED ORDER — MENTHOL 3 MG MT LOZG: 1.0000 | LOZENGE | OROMUCOSAL | Status: DC | PRN

## 2015-12-27 MED ORDER — DEXAMETHASONE SODIUM PHOSPHATE 10 MG/ML IJ SOLN
INTRAMUSCULAR | Status: DC | PRN
Start: 2015-12-27 — End: 2015-12-27

## 2015-12-27 MED ORDER — SODIUM CHLORIDE 0.9 % IJ SOLN
INTRAMUSCULAR | Status: DC | PRN
  Administered 2015-12-27: 30 mL

## 2015-12-27 MED ORDER — PHENOL 1.4 % MT LIQD
1.0000 | OROMUCOSAL | Status: DC | PRN
  Filled 2015-12-27: qty 177

## 2015-12-27 MED ORDER — ACETAMINOPHEN 160 MG/5ML PO SOLN: 325.0000 mg | ORAL | Status: DC | PRN

## 2015-12-27 MED ORDER — BISOPROLOL FUMARATE 5 MG PO TABS
2.5000 mg | ORAL_TABLET | Freq: Once | ORAL | Status: AC
  Administered 2015-12-27: 2.5 mg via ORAL
  Filled 2015-12-27: qty 0.5

## 2015-12-27 MED ORDER — ACETAMINOPHEN 500 MG PO TABS
1000.0000 mg | ORAL_TABLET | Freq: Four times a day (QID) | ORAL | Status: AC
  Administered 2015-12-27 – 2015-12-28 (×3): 1000 mg via ORAL
  Filled 2015-12-27 (×3): qty 2

## 2015-12-27 MED ORDER — DOCUSATE SODIUM 100 MG PO CAPS
100.0000 mg | ORAL_CAPSULE | Freq: Two times a day (BID) | ORAL | Status: DC
Start: 2015-12-27 — End: 2015-12-29
  Administered 2015-12-27 – 2015-12-29 (×4): 100 mg via ORAL

## 2015-12-27 MED ORDER — BUPIVACAINE LIPOSOME 1.3 % IJ SUSP
INTRAMUSCULAR | Status: DC | PRN
  Administered 2015-12-27: 20 mL

## 2015-12-27 MED ORDER — APIXABAN 2.5 MG PO TABS
2.5000 mg | ORAL_TABLET | Freq: Two times a day (BID) | ORAL | Status: DC
  Administered 2015-12-28 – 2015-12-29 (×3): 2.5 mg via ORAL
  Filled 2015-12-27 (×5): qty 1

## 2015-12-27 MED ORDER — BISACODYL 10 MG RE SUPP: 10.0000 mg | Freq: Every day | RECTAL | Status: DC | PRN

## 2015-12-27 MED ORDER — ONDANSETRON HCL 4 MG PO TABS: 4.0000 mg | ORAL_TABLET | Freq: Four times a day (QID) | ORAL | Status: DC | PRN

## 2015-12-27 MED ORDER — METHOCARBAMOL 1000 MG/10ML IJ SOLN
500.0000 mg | Freq: Four times a day (QID) | INTRAVENOUS | Status: DC | PRN
  Filled 2015-12-27 (×2): qty 5

## 2015-12-27 MED ORDER — BUPIVACAINE LIPOSOME 1.3 % IJ SUSP
20.0000 mL | Freq: Once | INTRAMUSCULAR | Status: DC
  Filled 2015-12-27: qty 20

## 2015-12-27 MED ORDER — 0.9 % SODIUM CHLORIDE (POUR BTL) OPTIME
TOPICAL | Status: DC | PRN
  Administered 2015-12-27: 1000 mL

## 2015-12-27 MED ORDER — ONDANSETRON HCL 4 MG/2ML IJ SOLN
INTRAMUSCULAR | Status: AC
  Filled 2015-12-27: qty 2

## 2015-12-27 MED ORDER — FENTANYL CITRATE (PF) 100 MCG/2ML IJ SOLN
INTRAMUSCULAR | Status: AC
  Filled 2015-12-27: qty 2

## 2015-12-27 MED ORDER — ACETAMINOPHEN 650 MG RE SUPP: 650.0000 mg | Freq: Four times a day (QID) | RECTAL | Status: DC | PRN

## 2015-12-27 MED ORDER — PROPOFOL 500 MG/50ML IV EMUL
INTRAVENOUS | Status: DC | PRN
  Administered 2015-12-27: 50 ug/kg/min via INTRAVENOUS

## 2015-12-27 MED ORDER — VERAPAMIL HCL ER 240 MG PO TBCR
240.0000 mg | EXTENDED_RELEASE_TABLET | Freq: Every day | ORAL | Status: DC
  Administered 2015-12-28 – 2015-12-29 (×2): 240 mg via ORAL
  Filled 2015-12-27 (×2): qty 1

## 2015-12-27 MED ORDER — DEXAMETHASONE SODIUM PHOSPHATE 10 MG/ML IJ SOLN
INTRAMUSCULAR | Status: AC
  Filled 2015-12-27: qty 1

## 2015-12-27 MED ORDER — DEXAMETHASONE SODIUM PHOSPHATE 10 MG/ML IJ SOLN
10.0000 mg | Freq: Once | INTRAMUSCULAR | Status: AC
  Administered 2015-12-27: 10 mg via INTRAVENOUS

## 2015-12-27 MED ORDER — OXYCODONE HCL 5 MG PO TABS: 5.0000 mg | ORAL_TABLET | Freq: Once | ORAL | Status: DC | PRN

## 2015-12-27 MED ORDER — SODIUM CHLORIDE 0.9 % IV SOLN: INTRAVENOUS | Status: DC

## 2015-12-27 MED ORDER — SODIUM CHLORIDE 0.9 % IJ SOLN
INTRAMUSCULAR | Status: AC
  Filled 2015-12-27: qty 50

## 2015-12-27 MED ORDER — TRANEXAMIC ACID 1000 MG/10ML IV SOLN
1000.0000 mg | INTRAVENOUS | Status: AC
  Administered 2015-12-27: 1000 mg via INTRAVENOUS
  Filled 2015-12-27: qty 10

## 2015-12-27 MED ORDER — OXYCODONE HCL 5 MG/5ML PO SOLN
5.0000 mg | Freq: Once | ORAL | Status: DC | PRN
  Filled 2015-12-27: qty 5

## 2015-12-27 MED ORDER — MIDAZOLAM HCL 2 MG/2ML IJ SOLN
INTRAMUSCULAR | Status: AC
  Filled 2015-12-27: qty 2

## 2015-12-27 MED ORDER — ACETAMINOPHEN 325 MG PO TABS: 650.0000 mg | ORAL_TABLET | Freq: Four times a day (QID) | ORAL | Status: DC | PRN

## 2015-12-27 MED ORDER — ONDANSETRON HCL 4 MG/2ML IJ SOLN: 4.0000 mg | Freq: Four times a day (QID) | INTRAMUSCULAR | Status: DC | PRN

## 2015-12-27 MED ORDER — BISOPROLOL-HYDROCHLOROTHIAZIDE 2.5-6.25 MG PO TABS
1.0000 | ORAL_TABLET | Freq: Every day | ORAL | Status: DC
  Administered 2015-12-28 – 2015-12-29 (×2): 1 via ORAL
  Filled 2015-12-27 (×2): qty 1

## 2015-12-27 MED ORDER — VANCOMYCIN HCL IN DEXTROSE 1-5 GM/200ML-% IV SOLN
1000.0000 mg | Freq: Two times a day (BID) | INTRAVENOUS | Status: AC
  Administered 2015-12-28: 1000 mg via INTRAVENOUS
  Filled 2015-12-27: qty 200

## 2015-12-27 MED ORDER — FLEET ENEMA 7-19 GM/118ML RE ENEM: 1.0000 | ENEMA | Freq: Once | RECTAL | Status: DC | PRN

## 2015-12-27 MED ORDER — TRAMADOL HCL 50 MG PO TABS: 50.0000 mg | ORAL_TABLET | Freq: Four times a day (QID) | ORAL | Status: DC | PRN

## 2015-12-27 MED ORDER — ONDANSETRON HCL 4 MG/2ML IJ SOLN
INTRAMUSCULAR | Status: DC | PRN
  Administered 2015-12-27: 4 mg via INTRAVENOUS

## 2015-12-27 MED ORDER — HYDROMORPHONE HCL 1 MG/ML IJ SOLN: 0.2500 mg | INTRAMUSCULAR | Status: DC | PRN

## 2015-12-27 MED ORDER — METHOCARBAMOL 500 MG PO TABS
500.0000 mg | ORAL_TABLET | Freq: Four times a day (QID) | ORAL | Status: DC | PRN
  Administered 2015-12-28: 500 mg via ORAL
  Filled 2015-12-27: qty 1

## 2015-12-27 MED ORDER — BUPIVACAINE HCL (PF) 0.25 % IJ SOLN
INTRAMUSCULAR | Status: AC
  Filled 2015-12-27: qty 30

## 2015-12-27 MED ORDER — TRANEXAMIC ACID 1000 MG/10ML IV SOLN
1000.0000 mg | Freq: Once | INTRAVENOUS | Status: AC
  Administered 2015-12-27: 1000 mg via INTRAVENOUS
  Filled 2015-12-27: qty 10

## 2015-12-27 MED ORDER — CHLORHEXIDINE GLUCONATE 4 % EX LIQD: 60.0000 mL | Freq: Once | CUTANEOUS | Status: DC

## 2015-12-27 MED ORDER — DEXAMETHASONE SODIUM PHOSPHATE 10 MG/ML IJ SOLN
10.0000 mg | Freq: Once | INTRAMUSCULAR | Status: DC
  Filled 2015-12-27: qty 1

## 2015-12-27 MED ORDER — FENTANYL CITRATE (PF) 100 MCG/2ML IJ SOLN
INTRAMUSCULAR | Status: DC | PRN
  Administered 2015-12-27: 50 ug via INTRAVENOUS
  Administered 2015-12-27 (×2): 25 ug via INTRAVENOUS

## 2015-12-27 MED ORDER — SODIUM CHLORIDE 0.9 % IR SOLN
Status: DC | PRN
  Administered 2015-12-27: 1000 mL

## 2015-12-27 MED ORDER — BUPIVACAINE HCL 0.25 % IJ SOLN
INTRAMUSCULAR | Status: DC | PRN
  Administered 2015-12-27: 30 mL

## 2015-12-27 MED ORDER — BUPIVACAINE IN DEXTROSE 0.75-8.25 % IT SOLN
INTRATHECAL | Status: DC | PRN
  Administered 2015-12-27: 1.8 mL via INTRATHECAL

## 2015-12-27 MED ORDER — MIDAZOLAM HCL 5 MG/5ML IJ SOLN
INTRAMUSCULAR | Status: DC | PRN
  Administered 2015-12-27: 2 mg via INTRAVENOUS

## 2015-12-27 MED ORDER — VANCOMYCIN HCL IN DEXTROSE 1-5 GM/200ML-% IV SOLN
1000.0000 mg | INTRAVENOUS | Status: AC
  Administered 2015-12-27: 1000 mg via INTRAVENOUS
  Filled 2015-12-27: qty 200

## 2015-12-27 MED ORDER — PROPOFOL 10 MG/ML IV BOLUS
INTRAVENOUS | Status: AC
  Filled 2015-12-27: qty 60

## 2015-12-27 MED ORDER — PROPOFOL 10 MG/ML IV BOLUS
INTRAVENOUS | Status: DC | PRN
  Administered 2015-12-27: 10 mg via INTRAVENOUS

## 2015-12-27 MED ORDER — LACTATED RINGERS IV SOLN
INTRAVENOUS | Status: DC
  Administered 2015-12-27: 1000 mL via INTRAVENOUS
  Administered 2015-12-27: 13:00:00 via INTRAVENOUS

## 2015-12-27 MED ORDER — METOCLOPRAMIDE HCL 10 MG PO TABS: 5.0000 mg | ORAL_TABLET | Freq: Three times a day (TID) | ORAL | Status: DC | PRN

## 2015-12-27 MED ORDER — ACETAMINOPHEN 10 MG/ML IV SOLN
INTRAVENOUS | Status: AC
  Filled 2015-12-27: qty 100

## 2015-12-27 SURGICAL SUPPLY — 50 items
BAG DECANTER FOR FLEXI CONT (MISCELLANEOUS) IMPLANT
BAG ZIPLOCK 12X15 (MISCELLANEOUS) ×3 IMPLANT
BANDAGE ACE 6X5 VEL STRL LF (GAUZE/BANDAGES/DRESSINGS) ×3 IMPLANT
BLADE SAG 18X100X1.27 (BLADE) ×3 IMPLANT
BLADE SAW SGTL 11.0X1.19X90.0M (BLADE) ×3 IMPLANT
BOWL SMART MIX CTS (DISPOSABLE) ×3 IMPLANT
CAP KNEE TOTAL 3 SIGMA ×3 IMPLANT
CEMENT HV SMART SET (Cement) ×6 IMPLANT
CLOSURE WOUND 1/2 X4 (GAUZE/BANDAGES/DRESSINGS) ×1
CLOTH BEACON ORANGE TIMEOUT ST (SAFETY) ×3 IMPLANT
CUFF TOURN SGL QUICK 34 (TOURNIQUET CUFF) ×2
CUFF TRNQT CYL 34X4X40X1 (TOURNIQUET CUFF) ×1 IMPLANT
DECANTER SPIKE VIAL GLASS SM (MISCELLANEOUS) IMPLANT
DRAPE U-SHAPE 47X51 STRL (DRAPES) ×3 IMPLANT
DRSG ADAPTIC 3X8 NADH LF (GAUZE/BANDAGES/DRESSINGS) ×3 IMPLANT
DRSG PAD ABDOMINAL 8X10 ST (GAUZE/BANDAGES/DRESSINGS) ×3 IMPLANT
DURAPREP 26ML APPLICATOR (WOUND CARE) ×3 IMPLANT
ELECT REM PT RETURN 9FT ADLT (ELECTROSURGICAL) ×3
ELECTRODE REM PT RTRN 9FT ADLT (ELECTROSURGICAL) ×1 IMPLANT
EVACUATOR 1/8 PVC DRAIN (DRAIN) ×3 IMPLANT
GAUZE SPONGE 4X4 12PLY STRL (GAUZE/BANDAGES/DRESSINGS) ×3 IMPLANT
GLOVE BIO SURGEON STRL SZ7.5 (GLOVE) ×6 IMPLANT
GLOVE BIO SURGEON STRL SZ8 (GLOVE) ×3 IMPLANT
GLOVE BIOGEL PI IND STRL 6.5 (GLOVE) ×1 IMPLANT
GLOVE BIOGEL PI IND STRL 8 (GLOVE) ×1 IMPLANT
GLOVE BIOGEL PI INDICATOR 6.5 (GLOVE) ×2
GLOVE BIOGEL PI INDICATOR 8 (GLOVE) ×2
GLOVE SURG SS PI 6.5 STRL IVOR (GLOVE) ×3 IMPLANT
GOWN STRL REUS W/TWL LRG LVL3 (GOWN DISPOSABLE) ×3 IMPLANT
GOWN STRL REUS W/TWL XL LVL3 (GOWN DISPOSABLE) IMPLANT
HANDPIECE INTERPULSE COAX TIP (DISPOSABLE) ×2
IMMOBILIZER KNEE 20 (SOFTGOODS) ×3
IMMOBILIZER KNEE 20 THIGH 36 (SOFTGOODS) ×1 IMPLANT
MANIFOLD NEPTUNE II (INSTRUMENTS) ×3 IMPLANT
NS IRRIG 1000ML POUR BTL (IV SOLUTION) ×3 IMPLANT
PACK TOTAL KNEE CUSTOM (KITS) ×3 IMPLANT
PADDING CAST COTTON 6X4 STRL (CAST SUPPLIES) ×3 IMPLANT
POSITIONER SURGICAL ARM (MISCELLANEOUS) ×3 IMPLANT
SET HNDPC FAN SPRY TIP SCT (DISPOSABLE) ×1 IMPLANT
STRIP CLOSURE SKIN 1/2X4 (GAUZE/BANDAGES/DRESSINGS) ×2 IMPLANT
SUT MNCRL AB 4-0 PS2 18 (SUTURE) ×3 IMPLANT
SUT VIC AB 2-0 CT1 27 (SUTURE) ×6
SUT VIC AB 2-0 CT1 TAPERPNT 27 (SUTURE) ×3 IMPLANT
SUT VLOC 180 0 24IN GS25 (SUTURE) ×3 IMPLANT
SYR 50ML LL SCALE MARK (SYRINGE) IMPLANT
TRAY FOLEY W/METER SILVER 14FR (SET/KITS/TRAYS/PACK) ×3 IMPLANT
TRAY FOLEY W/METER SILVER 16FR (SET/KITS/TRAYS/PACK) ×3 IMPLANT
WATER STERILE IRR 1500ML POUR (IV SOLUTION) ×3 IMPLANT
WRAP KNEE MAXI GEL POST OP (GAUZE/BANDAGES/DRESSINGS) ×3 IMPLANT
YANKAUER SUCT BULB TIP 10FT TU (MISCELLANEOUS) ×3 IMPLANT

## 2015-12-27 NOTE — Anesthesia Procedure Notes (Signed)
Spinal Patient location during procedure: OR Start time: 12/27/2015 12:31 PM End time: 12/27/2015 12:37 PM Staffing Anesthesiologist: Oleta Mouse Resident/CRNA: Darlys Gales R Performed by: resident/CRNA  Preanesthetic Checklist Completed: patient identified, site marked, surgical consent, pre-op evaluation, timeout performed, IV checked, risks and benefits discussed and monitors and equipment checked Spinal Block Patient position: sitting Prep: ChloraPrep Patient monitoring: heart rate, continuous pulse ox and blood pressure Approach: midline Location: L3-4 Injection technique: single-shot Needle Needle type: Spinocan  Needle gauge: 22 G Needle length: 9 cm Needle insertion depth: 7.5 cm Assessment Sensory level: T6 Additional Notes Lot [id number] Exp 2017-02-19.

## 2015-12-27 NOTE — Anesthesia Postprocedure Evaluation (Signed)
Anesthesia Post Note  Patient: [name]  Procedure(s) Performed: Procedure(s) (LRB): LEFT TOTAL KNEE ARTHROPLASTY (Left)  Patient location during evaluation: PACU Anesthesia Type: Spinal Level of consciousness: awake Pain management: pain level controlled Vital Signs Assessment: post-procedure vital signs reviewed and stable Respiratory status: spontaneous breathing Cardiovascular status: stable Postop Assessment: no signs of nausea or vomiting and spinal receding Anesthetic complications: no    Last Vitals:  Filed Vitals:   12/27/15 1545 12/27/15 1647  BP: 125/56 131/61  Pulse: 49 53  Temp: 36.4 C 36.7 C  Resp: 14 16    Last Pain:  Filed Vitals:   12/27/15 1724  PainSc: 1                  Javonna Balli

## 2015-12-27 NOTE — Progress Notes (Signed)
6th floor notified pt will be in 1616 in 20 minutes.

## 2015-12-27 NOTE — Interval H&P Note (Signed)
History and Physical Interval Note:  12/27/2015 10:28 AM  [name]  has presented today for surgery, with the diagnosis of OA LEFT KNEE  The various methods of treatment have been discussed with the patient and family. After consideration of risks, benefits and other options for treatment, the patient has consented to  Procedure(s): LEFT TOTAL KNEE ARTHROPLASTY (Left) as a surgical intervention .  The patient's history has been reviewed, patient examined, no change in status, stable for surgery.  I have reviewed the patient's chart and labs.  Questions were answered to the patient's satisfaction.     [name]

## 2015-12-27 NOTE — Anesthesia Preprocedure Evaluation (Addendum)
Anesthesia Evaluation  Patient identified by MRN, date of birth, ID band Patient awake    Reviewed: Allergy & Precautions, NPO status , Patient's Chart, lab work & pertinent test results  History of Anesthesia Complications Negative for: history of anesthetic complications  Airway Mallampati: II  TM Distance: >3 FB Neck ROM: Full    Dental  (+) Upper Dentures, Lower Dentures   Pulmonary neg pulmonary ROS,    breath sounds clear to auscultation       Cardiovascular hypertension, Pt. on medications and Pt. on home beta blockers (-) angina(-) CHF (-) dysrhythmias  Rhythm:Regular     Neuro/Psych  Headaches, neg Seizures negative psych ROS   GI/Hepatic Neg liver ROS, GERD  ,  Endo/Other  negative endocrine ROS  Renal/GU negative Renal ROS     Musculoskeletal  (+) Arthritis ,   Abdominal   Peds  Hematology negative hematology ROS (+)   Anesthesia Other Findings   Reproductive/Obstetrics                            Anesthesia Physical Anesthesia Plan  ASA: II  Anesthesia Plan: Spinal   Post-op Pain Management:    Induction: Intravenous  Airway Management Planned: Natural Airway, Nasal Cannula and Simple Face Mask  Additional Equipment: None  Intra-op Plan:   Post-operative Plan:   Informed Consent: I have reviewed the patients History and Physical, chart, labs and discussed the procedure including the risks, benefits and alternatives for the proposed anesthesia with the patient or authorized representative who has indicated his/her understanding and acceptance.   Dental advisory given  Plan Discussed with: CRNA and Surgeon  Anesthesia Plan Comments:         Anesthesia Quick Evaluation

## 2015-12-27 NOTE — Op Note (Signed)
Pre-operative diagnosis- Osteoarthritis  Left knee(s)  Post-operative diagnosis- Osteoarthritis Left knee(s)  Procedure-  Left  Total Knee Arthroplasty  Surgeon- Dione Plover. Monty Mccarrell, MD  Assistant- Arlee Muslim, PA-C   Anesthesia-  Spinal  EBL-* No blood loss amount entered *   Drains Hemovac  Tourniquet time-  Total Tourniquet Time Documented: Thigh (Left) - 33 minutes Total: Thigh (Left) - 33 minutes     Complications- None  Condition-PACU - hemodynamically stable.   Brief Clinical Note  [name] is a 75 y.o. year old female with end stage OA of her left knee with progressively worsening pain and dysfunction. She has constant pain, with activity and at rest and significant functional deficits with difficulties even with ADLs. She has had extensive non-op management including analgesics, injections of cortisone, and home exercise program, but remains in significant pain with significant dysfunction. Radiographs show bone on bone arthritis. She presents now for left Total Knee Arthroplasty.    Procedure in detail---   The patient is brought into the operating room and positioned supine on the operating table. After successful administration of  Spinal,   a tourniquet is placed high on the  Left thigh(s) and the lower extremity is prepped and draped in the usual sterile fashion. Time out is performed by the operating team and then the  Left lower extremity is wrapped in Esmarch, knee flexed and the tourniquet inflated to 300 mmHg.       A midline incision is made with a ten blade through the subcutaneous tissue to the level of the extensor mechanism. A fresh blade is used to make a medial parapatellar arthrotomy. Soft tissue over the proximal medial tibia is subperiosteally elevated to the joint line with a knife and into the semimembranosus bursa with a Cobb elevator. Soft tissue over the proximal lateral tibia is elevated with attention being paid to avoiding the patellar tendon  on the tibial tubercle. The patella is everted, knee flexed 90 degrees and the ACL and PCL are removed. Findings are bone on bone medial and patellofemoral with large global osteophytes.        The drill is used to create a starting hole in the distal femur and the canal is thoroughly irrigated with sterile saline to remove the fatty contents. The 5 degree Left  valgus alignment guide is placed into the femoral canal and the distal femoral cutting block is pinned to remove 10 mm off the distal femur. Resection is made with an oscillating saw.      The tibia is subluxed forward and the menisci are removed. The extramedullary alignment guide is placed referencing proximally at the medial aspect of the tibial tubercle and distally along the second metatarsal axis and tibial crest. The block is pinned to remove 18mm off the more deficient lateral  side. Resection is made with an oscillating saw. Size 3is the most appropriate size for the tibia and the proximal tibia is prepared with the modular drill and keel punch for that size.      The femoral sizing guide is placed and size 2.5 is most appropriate. Rotation is marked off the epicondylar axis and confirmed by creating a rectangular flexion gap at 90 degrees. The size 2.5 cutting block is pinned in this rotation and the anterior, posterior and chamfer cuts are made with the oscillating saw. The intercondylar block is then placed and that cut is made.      Trial size 3 tibial component, trial size 2.5 posterior stabilized  femur and a 10  mm posterior stabilized rotating platform insert trial is placed. Full extension is achieved with excellent varus/valgus and anterior/posterior balance throughout full range of motion. The patella is everted and thickness measured to be 22  mm. Free hand resection is taken to 12 mm, a 35 template is placed, lug holes are drilled, trial patella is placed, and it tracks normally. Osteophytes are removed off the posterior femur with  the trial in place. All trials are removed and the cut bone surfaces prepared with pulsatile lavage. Cement is mixed and once ready for implantation, the size 3 tibial implant, size  2.5 posterior stabilized femoral component, and the size 35 patella are cemented in place and the patella is held with the clamp. The trial insert is placed and the knee held in full extension. The Exparel (20 ml mixed with 30 ml saline) and .25% Bupivicaine, are injected into the extensor mechanism, posterior capsule, medial and lateral gutters and subcutaneous tissues.  All extruded cement is removed and once the cement is hard the permanent 10 mm posterior stabilized rotating platform insert is placed into the tibial tray.      The wound is copiously irrigated with saline solution and the extensor mechanism closed over a hemovac drain with #1 V-loc suture. The tourniquet is released for a total tourniquet time of 33  minutes. Flexion against gravity is 140 degrees and the patella tracks normally. Subcutaneous tissue is closed with 2.0 vicryl and subcuticular with running 4.0 Monocryl. The incision is cleaned and dried and steri-strips and a bulky sterile dressing are applied. The limb is placed into a knee immobilizer and the patient is awakened and transported to recovery in stable condition.      Please note that a surgical assistant was a medical necessity for this procedure in order to perform it in a safe and expeditious manner. Surgical assistant was necessary to retract the ligaments and vital neurovascular structures to prevent injury to them and also necessary for proper positioning of the limb to allow for anatomic placement of the prosthesis.   Dione Plover Avinash Maltos, MD    12/27/2015, 1:47 PM

## 2015-12-27 NOTE — Progress Notes (Signed)
Utilization review completed.  

## 2015-12-27 NOTE — Transfer of Care (Signed)
Immediate Anesthesia Transfer of Care Note  Patient: [name]  Procedure(s) Performed: Procedure(s): LEFT TOTAL KNEE ARTHROPLASTY (Left)  Patient Location: PACU  Anesthesia Type:Spinal  Level of Consciousness:  sedated, patient cooperative and responds to stimulation  Airway & Oxygen Therapy:Patient Spontanous Breathing and Patient connected to face mask oxgen  Post-op Assessment:  Report given to PACU RN and Post -op Vital signs reviewed and stable  Post vital signs:  Reviewed and stable, spinal L1  Last Vitals:  Filed Vitals:   12/27/15 0928  BP: 150/80  Pulse: 70  Temp: 37.1 C  Resp: 16    Complications: No apparent anesthesia complications

## 2015-12-28 LAB — BASIC METABOLIC PANEL
Anion gap: 9 (ref 5–15)
BUN: 13 mg/dL (ref 6–20)
CHLORIDE: 109 mmol/L (ref 101–111)
CO2: 22 mmol/L (ref 22–32)
CREATININE: 0.78 mg/dL (ref 0.44–1.00)
Calcium: 8.8 mg/dL — ABNORMAL LOW (ref 8.9–10.3)
GFR calc Af Amer: >60 mL/min (ref >60)
GLUCOSE: 176 mg/dL — AB (ref 65–99)
POTASSIUM: 3.8 mmol/L (ref 3.5–5.1)
Sodium: 140 mmol/L (ref 135–145)

## 2015-12-28 LAB — CBC
HCT: 35.5 % — ABNORMAL LOW (ref 36.0–46.0)
Hemoglobin: 11.8 g/dL — ABNORMAL LOW (ref 12.0–15.0)
MCH: 30.3 pg (ref 26.0–34.0)
MCHC: 33.2 g/dL (ref 30.0–36.0)
MCV: 91.3 fL (ref 78.0–100.0)
PLATELETS: 216 10*3/uL (ref 150–400)
RBC: 3.89 MIL/uL (ref 3.87–5.11)
RDW: 12.8 % (ref 11.5–15.5)
WBC: 10.7 10*3/uL — ABNORMAL HIGH (ref 4.0–10.5)

## 2015-12-28 MED ORDER — OXYCODONE HCL 5 MG PO TABS: 5.0000 mg | ORAL_TABLET | ORAL | Status: DC | PRN

## 2015-12-28 MED ORDER — APIXABAN 2.5 MG PO TABS: 2.5000 mg | ORAL_TABLET | Freq: Two times a day (BID) | ORAL | Status: DC

## 2015-12-28 MED ORDER — METHOCARBAMOL 500 MG PO TABS: 500.0000 mg | ORAL_TABLET | Freq: Four times a day (QID) | ORAL | Status: DC | PRN

## 2015-12-28 MED ORDER — TRAMADOL HCL 50 MG PO TABS: 50.0000 mg | ORAL_TABLET | Freq: Four times a day (QID) | ORAL | Status: DC | PRN

## 2015-12-28 NOTE — Care Management Note (Signed)
Case Management Note  Patient Details  Name: AKILI CUDA MRN: [id number] Date of Birth: 06/08/1941  Subjective/Objective:                  LEFT TOTAL KNEE ARTHROPLASTY (Left) Action/Plan: Discharge planning Expected Discharge Date:  12/29/15               Expected Discharge Plan:  [place]  In-House Referral:     Discharge planning Services  CM Consult  Post Acute Care Choice:    Choice offered to:  Patient, Adult Children  DME Arranged:  N/A DME Agency:  NA  HH Arranged:  NA HH Agency:  [facility]  Status of Service:  Completed, signed off  Medicare Important Message Given:    Date Medicare IM Given:    Medicare IM give by:    Date Additional Medicare IM Given:    Additional Medicare Important Message give by:     If discussed at [place] of Stay Meetings, dates discussed:    Additional Comments: CM met with pt in room to offer choice of home health agency.  Pt chooses [facility] to render HHPT.  Referral given to [facility] rep, Tim (on unit).  Pt states she has both a rolling walker and 3n1 at home.  No other CM needs were communicated. Dellie Catholic, RN 12/28/2015, 2:28 PM

## 2015-12-28 NOTE — Progress Notes (Signed)
   Subjective: 1 Day Post-Op Procedure(s) (LRB): LEFT TOTAL KNEE ARTHROPLASTY (Left) Patient reports pain as mild. Biggest complaint was no sleep. Patient seen in rounds with Dr. Wynelle Link. Patient is well, and has had no acute complaints or problems We will start therapy today.  Plan is to go Home after hospital stay.  Objective: Vital signs in last 24 hours: Temp:  [97.3 F (36.3 C)-98.7 F (37.1 C)] 98 F (36.7 C) (03/07 0916) Pulse Rate:  [46-72] 64 (03/07 0916) Resp:  [9-18] 18 (03/07 0554) BP: (112-159)/(50-80) 137/56 mmHg (03/07 0916) SpO2:  [93 %-100 %] 95 % (03/07 0916) Weight:  [86.637 kg (191 lb)] 86.637 kg (191 lb) (03/06 1008)  Intake/Output from previous day:  Intake/Output Summary (Last 24 hours) at 12/28/15 0918 Last data filed at 12/28/15 0554  Gross per 24 hour  Intake   3560 ml  Output   2720 ml  Net    840 ml    Intake/Output this shift: UOP 1400  Labs:  Recent Labs  12/28/15 0349  HGB 11.8*    Recent Labs  12/28/15 0349  WBC 10.7*  RBC 3.89  HCT 35.5*  PLT 216    Recent Labs  12/28/15 0349  NA 140  K 3.8  CL 109  CO2 22  BUN 13  CREATININE 0.78  GLUCOSE 176*  CALCIUM 8.8*   No results for input(s): LABPT, INR in the last 72 hours.  EXAM General - Patient is Alert, Appropriate and Oriented Extremity - Neurovascular intact Sensation intact distally Dorsiflexion/Plantar flexion intact Dressing - dressing C/D/I Motor Function - intact, moving foot and toes well on exam.  Hemovac pulled without difficulty.  Past Medical History  Diagnosis Date  . Hypertension   . Arthritis   . Allergy   . Hyperlipidemia   . Angioedema   . Hx of echocardiogram 10/2005    normal  . GERD (gastroesophageal reflux disease)   . Headache   . Thyroid disease     treated with radiation in 1980s     Assessment/Plan: 1 Day Post-Op Procedure(s) (LRB): LEFT TOTAL KNEE ARTHROPLASTY (Left) Principal Problem:   Osteoarthritis of left  knee Active Problems:   OA (osteoarthritis) of knee  Estimated body mass index is 29.05 kg/(m^2) as calculated from the following:   Height as of this encounter: 5\' 8"  (1.727 m).   Weight as of this encounter: 86.637 kg (191 lb). Advance diet Up with therapy Plan for discharge tomorrow Discharge home with home health  DVT Prophylaxis - Eliquis Weight-Bearing as tolerated to left leg D/C O2 and Pulse OX and try on Room Air  Arlee Muslim, PA-C Orthopaedic Surgery 12/28/2015, 9:18 AM

## 2015-12-28 NOTE — Discharge Instructions (Addendum)
° °Dr. Frank Aluisio °Total Joint Specialist °[facility] °[address] °[place], [place] [address] °[phone] ° °TOTAL KNEE REPLACEMENT POSTOPERATIVE DIRECTIONS ° °Knee Rehabilitation, Guidelines Following Surgery  °Results after knee surgery are often greatly improved when you follow the exercise, range of motion and muscle strengthening exercises prescribed by your doctor. Safety measures are also important to protect the knee from further injury. Any time any of these exercises cause you to have increased pain or swelling in your knee joint, decrease the amount until you are comfortable again and slowly increase them. If you have problems or questions, call your caregiver or physical therapist for advice.  ° °HOME CARE INSTRUCTIONS  °Remove items at home which could result in a fall. This includes throw rugs or furniture in walking pathways.  °· ICE to the affected knee every three hours for 30 minutes at a time and then as needed for pain and swelling.  Continue to use ice on the knee for pain and swelling from surgery. You may notice swelling that will progress down to the foot and ankle.  This is normal after surgery.  Elevate the leg when you are not up walking on it.   °· Continue to use the breathing machine which will help keep your temperature down.  It is common for your temperature to cycle up and down following surgery, especially at night when you are not up moving around and exerting yourself.  The breathing machine keeps your lungs expanded and your temperature down. °· Do not place pillow under knee, focus on keeping the knee straight while resting ° °DIET °You may resume your previous home diet once your are discharged from the hospital. ° °DRESSING / WOUND CARE / SHOWERING °You may shower 3 days after surgery, but keep the wounds dry during showering.  You may use an occlusive plastic wrap (Press'n Seal for example), NO SOAKING/SUBMERGING IN THE BATHTUB.  If the  bandage gets wet, change with a clean dry gauze.  If the incision gets wet, pat the wound dry with a clean towel. °You may start showering once you are discharged home but do not submerge the incision under water. Just pat the incision dry and apply a dry gauze dressing on daily. °Change the surgical dressing daily and reapply a dry dressing each time. ° °ACTIVITY °Walk with your walker as instructed. °Use walker as long as suggested by your caregivers. °Avoid periods of inactivity such as sitting longer than an hour when not asleep. This helps prevent blood clots.  °You may resume a sexual relationship in one month or when given the OK by your doctor.  °You may return to work once you are cleared by your doctor.  °Do not drive a car for 6 weeks or until released by you surgeon.  °Do not drive while taking narcotics. ° °WEIGHT BEARING °Weight bearing as tolerated with assist device (walker, cane, etc) as directed, use it as long as suggested by your surgeon or therapist, typically at least 4-6 weeks. ° °POSTOPERATIVE CONSTIPATION PROTOCOL °Constipation - defined medically as fewer than three stools per week and severe constipation as less than one stool per week. ° °One of the most common issues patients have following surgery is constipation.  Even if you have a regular bowel pattern at home, your normal regimen is likely to be disrupted due to multiple reasons following surgery.  Combination of anesthesia, postoperative narcotics, change in appetite and fluid intake all can affect your bowels.    In order to avoid complications following surgery, here are some recommendations in order to help you during your recovery period. ° °Colace (docusate) - Pick up an over-the-counter form of Colace or another stool softener and take twice a day as long as you are requiring postoperative pain medications.  Take with a full glass of water daily.  If you experience loose stools or diarrhea, hold the colace until you stool forms  back up.  If your symptoms do not get better within 1 week or if they get worse, check with your doctor. ° °Dulcolax (bisacodyl) - Pick up over-the-counter and take as directed by the product packaging as needed to assist with the movement of your bowels.  Take with a full glass of water.  Use this product as needed if not relieved by Colace only.  ° °MiraLax (polyethylene glycol) - Pick up over-the-counter to have on hand.  MiraLax is a solution that will increase the amount of water in your bowels to assist with bowel movements.  Take as directed and can mix with a glass of water, juice, soda, coffee, or tea.  Take if you go more than two days without a movement. °Do not use MiraLax more than once per day. Call your doctor if you are still constipated or irregular after using this medication for 7 days in a row. ° °If you continue to have problems with postoperative constipation, please contact the office for further assistance and recommendations.  If you experience "the worst abdominal pain ever" or develop nausea or vomiting, please contact the office immediatly for further recommendations for treatment. ° °ITCHING ° If you experience itching with your medications, try taking only a single pain pill, or even half a pain pill at a time.  You can also use Benadryl over the counter for itching or also to help with sleep.  ° °TED HOSE STOCKINGS °Wear the elastic stockings on both legs for three weeks following surgery during the day but you may remove then at night for sleeping. ° °MEDICATIONS °See your medication summary on the “After Visit Summary” that the nursing staff will review with you prior to discharge.  You may have some home medications which will be placed on hold until you complete the course of blood thinner medication.  It is important for you to complete the blood thinner medication as prescribed by your surgeon.  Continue your approved medications as instructed at time of  discharge. ° °PRECAUTIONS °If you experience chest pain or shortness of breath - call 911 immediately for transfer to the hospital emergency department.  °If you develop a fever greater that 101 F, purulent drainage from wound, increased redness or drainage from wound, foul odor from the wound/dressing, or calf pain - CONTACT YOUR SURGEON.   °                                                °FOLLOW-UP APPOINTMENTS °Make sure you keep all of your appointments after your operation with your surgeon and caregivers. You should call the office at the above phone number and make an appointment for approximately two weeks after the date of your surgery or on the date instructed by your surgeon outlined in the "After Visit Summary". ° ° °RANGE OF MOTION AND STRENGTHENING EXERCISES  °Rehabilitation of the knee is important following a knee injury or   an operation. After just a few days of immobilization, the muscles of the thigh which control the knee become weakened and shrink (atrophy). Knee exercises are designed to build up the tone and strength of the thigh muscles and to improve knee motion. Often times heat used for twenty to thirty minutes before working out will loosen up your tissues and help with improving the range of motion but do not use heat for the first two weeks following surgery. These exercises can be done on a training (exercise) mat, on the floor, on a table or on a bed. Use what ever works the best and is most comfortable for you Knee exercises include:  Leg Lifts - While your knee is still immobilized in a splint or cast, you can do straight leg raises. Lift the leg to 60 degrees, hold for 3 sec, and slowly lower the leg. Repeat 10-20 times 2-3 times daily. Perform this exercise against resistance later as your knee gets better.  Quad and Hamstring Sets - Tighten up the muscle on the front of the thigh (Quad) and hold for 5-10 sec. Repeat this 10-20 times hourly. Hamstring sets are done by pushing the  foot backward against an object and holding for 5-10 sec. Repeat as with quad sets.   Leg Slides: Lying on your back, slowly slide your foot toward your buttocks, bending your knee up off the floor (only go as far as is comfortable). Then slowly slide your foot back down until your leg is flat on the floor again.  Angel Wings: Lying on your back spread your legs to the side as far apart as you can without causing discomfort.  A rehabilitation program following serious knee injuries can speed recovery and prevent re-injury in the future due to weakened muscles. Contact your doctor or a physical therapist for more information on knee rehabilitation.   IF YOU ARE TRANSFERRED TO A SKILLED REHAB FACILITY If the patient is transferred to a skilled rehab facility following release from the hospital, a list of the current medications will be sent to the facility for the patient to continue.  When discharged from the skilled rehab facility, please have the facility set up the patient's [place] prior to being released. Also, the skilled facility will be responsible for providing the patient with their medications at time of release from the facility to include their pain medication, the muscle relaxants, and their blood thinner medication. If the patient is still at the rehab facility at time of the two week follow up appointment, the skilled rehab facility will also need to assist the patient in arranging follow up appointment in our office and any transportation needs.  MAKE SURE YOU:  Understand these instructions.  Get help right away if you are not doing well or get worse.    Pick up stool softner and laxative for home use following surgery while on pain medications. Do not submerge incision under water. Please use good hand washing techniques while changing dressing each day. May shower starting three days after surgery. Please use a clean towel to pat the incision dry following  showers. Continue to use ice for pain and swelling after surgery. Do not use any lotions or creams on the incision until instructed by your surgeon.  Take Eliquis twice a day for three more weeks, then discontinue the Eliquis. After completing the Eliquis, then take a baby 81 mg Aspirin daily for three additional weeks.  Information on my medicine - ELIQUIS (  apixaban)  This medication education was reviewed with me or my healthcare representative as part of my discharge preparation.  Why was Eliquis prescribed for you? Eliquis was prescribed for you to reduce the risk of blood clots forming after orthopedic surgery.    What do You need to know about Eliquis? Take your Eliquis TWICE DAILY - one tablet in the morning and one tablet in the evening with or without food.  It would be best to take the dose about the same time each day.  If you have difficulty swallowing the tablet whole please discuss with your pharmacist how to take the medication safely.  Take Eliquis exactly as prescribed by your doctor and DO NOT stop taking Eliquis without talking to the doctor who prescribed the medication.  Stopping without other medication to take the place of Eliquis may increase your risk of developing a clot.  After discharge, you should have regular check-up appointments with your healthcare provider that is prescribing your Eliquis.  What do you do if you miss a dose? If a dose of ELIQUIS is not taken at the scheduled time, take it as soon as possible on the same day and twice-daily administration should be resumed.  The dose should not be doubled to make up for a missed dose.  Do not take more than one tablet of ELIQUIS at the same time.  Important Safety Information A possible side effect of Eliquis is bleeding. You should call your healthcare provider right away if you experience any of the following: ? Bleeding from an injury or your nose that does not stop. ? Unusual colored urine  (red or dark brown) or unusual colored stools (red or black). ? Unusual bruising for unknown reasons. ? A serious fall or if you hit your head (even if there is no bleeding).  Some medicines may interact with Eliquis and might increase your risk of bleeding or clotting while on Eliquis. To help avoid this, consult your healthcare provider or pharmacist prior to using any new prescription or non-prescription medications, including herbals, vitamins, non-steroidal anti-inflammatory drugs (NSAIDs) and supplements.  This website has more information on Eliquis (apixaban): http://www.eliquis.com/eliquis/home

## 2015-12-28 NOTE — Progress Notes (Signed)
When doing hourly rounding at 0440 patient asked for pain med and the husband stated that he had just gave 2 Tylenol to the patient from his own bottle.  Patient and husband were told and educated to refrain using any medicine from home while under medical care in the hospital.  Explained the side effects of liver toxcity from over dosing Tylenol. Patient and husband verbally understand, Will hold  the 6 am dose Tylenol.

## 2015-12-28 NOTE — Discharge Summary (Signed)
Physician Discharge Summary   Patient ID: [name] MRN: [id number] DOB/AGE: 77-Jul-1942 77 y.o.  Admit date: 12/27/2015 Discharge date: 12/29/2015  Primary Diagnosis:  Osteoarthritis Left knee(s)  Admission Diagnoses:  Past Medical History  Diagnosis Date  . Hypertension   . Arthritis   . Allergy   . Hyperlipidemia   . Angioedema   . Hx of echocardiogram 10/2005    normal  . GERD (gastroesophageal reflux disease)   . Headache   . 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   Weight as of this encounter: 86.637 kg (191 lb).  Procedure:  Procedure(s) (LRB): LEFT TOTAL KNEE ARTHROPLASTY (Left)   Consults: None  HPI: [name] is a 75 y.o. year old female with end stage OA of her left knee with progressively worsening pain and dysfunction. She has constant pain, with activity and at rest and significant functional deficits with difficulties even with ADLs. She has had extensive non-op management including analgesics, injections of cortisone, and home exercise program, but remains in significant pain with significant dysfunction. Radiographs show bone on bone arthritis. She presents now for left Total Knee Arthroplasty.   Laboratory Data: Admission on 12/27/2015, Discharged on 12/29/2015  Component Date Value Ref Range Status  . WBC 12/28/2015 10.7* 4.0 - 10.5 K/uL Final  . RBC 12/28/2015 3.89  3.87 - 5.11 MIL/uL Final  . Hemoglobin 12/28/2015 11.8* 12.0 - 15.0 g/dL Final  . HCT 12/28/2015 35.5* 36.0 - 46.0 % Final  . MCV 12/28/2015 91.3  78.0 - 100.0 fL Final  . MCH 12/28/2015 30.3  26.0 - 34.0 pg Final  . MCHC 12/28/2015 33.2  30.0 - 36.0 g/dL Final  . RDW 12/28/2015 12.8  11.5 - 15.5 % Final  .  Platelets 12/28/2015 216  150 - 400 K/uL Final  . Sodium 12/28/2015 140  135 - 145 mmol/L Final  . Potassium 12/28/2015 3.8  3.5 - 5.1 mmol/L Final  . Chloride 12/28/2015 109  101 - 111 mmol/L Final  . CO2 12/28/2015 22  22 - 32 mmol/L Final  . Glucose, Bld 12/28/2015 176* 65 - 99 mg/dL Final  . BUN 12/28/2015 13  6 - 20 mg/dL Final  . Creatinine, Ser 12/28/2015 0.78  0.44 - 1.00 mg/dL Final  . Calcium 12/28/2015 8.8* 8.9 - 10.3 mg/dL Final  . GFR calc non Af Amer 12/28/2015 >60  >60 mL/min Final  . GFR calc Af Amer 12/28/2015 >60  >60 mL/min Final   Comment: (NOTE) The eGFR has been calculated using the CKD EPI equation. This calculation has not been validated in all clinical situations. eGFR's persistently <60 mL/min signify possible Chronic Kidney Disease.   . Anion gap 12/28/2015 9  5 - 15 Final  . WBC 12/29/2015 9.6  4.0 - 10.5 K/uL Final  . RBC 12/29/2015 3.54* 3.87 - 5.11 MIL/uL Final  . Hemoglobin 12/29/2015 10.8* 12.0 - 15.0 g/dL Final  . HCT 12/29/2015 32.6* 36.0 - 46.0 % Final  . MCV 12/29/2015 92.1  78.0 - 100.0 fL Final  . MCH 12/29/2015 30.5  26.0 - 34.0 pg Final  . MCHC 12/29/2015 33.1  30.0 - 36.0 g/dL Final  . RDW 12/29/2015 13.3  11.5 -  15.5 % Final  . Platelets 12/29/2015 194  150 - 400 K/uL Final  . Sodium 12/29/2015 139  135 - 145 mmol/L Final  . Potassium 12/29/2015 3.6  3.5 - 5.1 mmol/L Final  . Chloride 12/29/2015 105  101 - 111 mmol/L Final  . CO2 12/29/2015 25  22 - 32 mmol/L Final  . Glucose, Bld 12/29/2015 135* 65 - 99 mg/dL Final  . BUN 12/29/2015 12  6 - 20 mg/dL Final  . Creatinine, Ser 12/29/2015 0.71  0.44 - 1.00 mg/dL Final  . Calcium 12/29/2015 8.3* 8.9 - 10.3 mg/dL Final  . GFR calc non Af Amer 12/29/2015 >60  >60 mL/min Final  . GFR calc Af Amer 12/29/2015 >60  >60 mL/min Final   Comment: (NOTE) The eGFR has been calculated using the CKD EPI equation. This calculation has not been validated in all clinical situations. eGFR's persistently  <60 mL/min signify possible Chronic Kidney Disease.   Georgiann Hahn gap 12/29/2015 9  5 - 15 Final  Hospital Outpatient Visit on 12/21/2015  Component Date Value Ref Range Status  . aPTT 12/21/2015 27  24 - 37 seconds Final  . WBC 12/21/2015 5.9  4.0 - 10.5 K/uL Final  . RBC 12/21/2015 4.61  3.87 - 5.11 MIL/uL Final  . Hemoglobin 12/21/2015 13.7  12.0 - 15.0 g/dL Final  . HCT 12/21/2015 42.8  36.0 - 46.0 % Final  . MCV 12/21/2015 92.8  78.0 - 100.0 fL Final  . MCH 12/21/2015 29.7  26.0 - 34.0 pg Final  . MCHC 12/21/2015 32.0  30.0 - 36.0 g/dL Final  . RDW 12/21/2015 13.1  11.5 - 15.5 % Final  . Platelets 12/21/2015 213  150 - 400 K/uL Final  . Sodium 12/21/2015 141  135 - 145 mmol/L Final  . Potassium 12/21/2015 3.9  3.5 - 5.1 mmol/L Final  . Chloride 12/21/2015 107  101 - 111 mmol/L Final  . CO2 12/21/2015 24  22 - 32 mmol/L Final  . Glucose, Bld 12/21/2015 115* 65 - 99 mg/dL Final  . BUN 12/21/2015 13  6 - 20 mg/dL Final  . Creatinine, Ser 12/21/2015 0.86  0.44 - 1.00 mg/dL Final  . Calcium 12/21/2015 9.0  8.9 - 10.3 mg/dL Final  . Total Protein 12/21/2015 7.4  6.5 - 8.1 g/dL Final  . Albumin 12/21/2015 4.2  3.5 - 5.0 g/dL Final  . AST 12/21/2015 17  15 - 41 U/L Final  . ALT 12/21/2015 13* 14 - 54 U/L Final  . Alkaline Phosphatase 12/21/2015 87  38 - 126 U/L Final  . Total Bilirubin 12/21/2015 0.7  0.3 - 1.2 mg/dL Final  . GFR calc non Af Amer 12/21/2015 >60  >60 mL/min Final  . GFR calc Af Amer 12/21/2015 >60  >60 mL/min Final   Comment: (NOTE) The eGFR has been calculated using the CKD EPI equation. This calculation has not been validated in all clinical situations. eGFR's persistently <60 mL/min signify possible Chronic Kidney Disease.   . Anion gap 12/21/2015 10  5 - 15 Final  . Prothrombin Time 12/21/2015 13.1  11.6 - 15.2 seconds Final  . INR 12/21/2015 0.97  0.00 - 1.49 Final  . ABO/RH(D) 12/21/2015 B POS   Final  . Antibody Screen 12/21/2015 NEG   Final  . Sample  Expiration 12/21/2015 12/30/2015   Final  . Extend sample reason 12/21/2015 NO TRANSFUSIONS OR PREGNANCY IN THE PAST 3 MONTHS   Final  . Color, Urine 12/21/2015 YELLOW  YELLOW Final  .  APPearance 12/21/2015 CLEAR  CLEAR Final  . Specific Gravity, Urine 12/21/2015 1.018  1.005 - 1.030 Final  . pH 12/21/2015 5.0  5.0 - 8.0 Final  . Glucose, UA 12/21/2015 NEGATIVE  NEGATIVE mg/dL Final  . Hgb urine dipstick 12/21/2015 NEGATIVE  NEGATIVE Final  . Bilirubin Urine 12/21/2015 NEGATIVE  NEGATIVE Final  . Ketones, ur 12/21/2015 NEGATIVE  NEGATIVE mg/dL Final  . Protein, ur 12/21/2015 NEGATIVE  NEGATIVE mg/dL Final  . Nitrite 12/21/2015 NEGATIVE  NEGATIVE Final  . Leukocytes, UA 12/21/2015 TRACE* NEGATIVE Final  . MRSA, PCR 12/21/2015 NEGATIVE  NEGATIVE Final  . Staphylococcus aureus 12/21/2015 NEGATIVE  NEGATIVE Final   Comment:        The Xpert SA Assay (FDA approved for NASAL specimens in patients over 62 years of age), is one component of a comprehensive surveillance program.  Test performance has been validated by [facility] for patients greater than or equal to 34 year old. It is not intended to diagnose infection nor to guide or monitor treatment.   . Squamous Epithelial / LPF 12/21/2015 6-30* NONE SEEN Final  . WBC, UA 12/21/2015 0-5  0 - 5 WBC/hpf Final  . RBC / HPF 12/21/2015 NONE SEEN  0 - 5 RBC/hpf Final  . Bacteria, UA 12/21/2015 NONE SEEN  NONE SEEN Final  . ABO/RH(D) 12/21/2015 B POS   Final  Office Visit on 11/15/2015  Component Date Value Ref Range Status  . Glucose 11/15/2015 92  65 - 99 mg/dL Final  . BUN 11/15/2015 12  8 - 27 mg/dL Final  . Creatinine, Ser 11/15/2015 0.87  0.57 - 1.00 mg/dL Final  . GFR calc non Af Amer 11/15/2015 65  >59 mL/min/1.73 Final  . GFR calc Af Amer 11/15/2015 75  >59 mL/min/1.73 Final  . BUN/Creatinine Ratio 11/15/2015 14  11 - 26 Final  . Sodium 11/15/2015 142  134 - 144 mmol/L Final  . Potassium 11/15/2015 4.5  3.5 - 5.2 mmol/L Final    . Chloride 11/15/2015 100  96 - 106 mmol/L Final  . CO2 11/15/2015 26  18 - 29 mmol/L Final  . Calcium 11/15/2015 9.5  8.7 - 10.3 mg/dL Final     X-Rays:No results found.  EKG: Orders placed or performed in visit on 11/25/15  . EKG 12-Lead     Hospital Course: [name] is a 75 y.o. who was admitted to [facility]. They were brought to the operating room on 12/27/2015 and underwent Procedure(s): LEFT TOTAL KNEE ARTHROPLASTY.  Patient tolerated the procedure well and was later transferred to the recovery room and then to the orthopaedic floor for postoperative care.  They were given PO and IV analgesics for pain control following their surgery.  They were given 24 hours of postoperative antibiotics of  Anti-infectives    Start     Dose/Rate Route Frequency Ordered Stop   12/28/15 0100  vancomycin (VANCOCIN) IVPB 1000 mg/200 mL premix     1,000 mg 200 mL/hr over 60 Minutes Intravenous Every 12 hours 12/27/15 1603 12/28/15 0107   12/27/15 1006  vancomycin (VANCOCIN) IVPB 1000 mg/200 mL premix     1,000 mg 200 mL/hr over 60 Minutes Intravenous On call to O.R. 12/27/15 1006 12/27/15 1230     and started on DVT prophylaxis in the form of eliquis.   PT and OT were ordered for total joint protocol.  Discharge planning consulted to help with postop disposition and equipment needs.  Patient had a decent night on  the evening of surgery.  They started to get up OOB with therapy on day one. Hemovac drain was pulled without difficulty.  Continued to work with therapy into day two.  Dressing was changed on day two and the incision was healing well.   Patient was seen in rounds and was ready to go home.  Discharge home with home health Diet - Cardiac diet Follow up - in 2 weeks Activity - WBAT Disposition - Home Condition Upon Discharge - Good D/C Meds - See DC Summary DVT Prophylaxis - Eliquis      Discharge Instructions    Call MD / Call 911    Complete by:  As directed   If  you experience chest pain or shortness of breath, CALL 911 and be transported to the hospital emergency room.  If you develope a fever above 101 F, pus (white drainage) or increased drainage or redness at the wound, or calf pain, call your surgeon's office.     Change dressing    Complete by:  As directed   Change dressing daily with sterile 4 x 4 inch gauze dressing and apply TED hose. Do not submerge the incision under water.     Constipation Prevention    Complete by:  As directed   Drink plenty of fluids.  Prune juice may be helpful.  You may use a stool softener, such as Colace (over the counter) 100 mg twice a day.  Use MiraLax (over the counter) for constipation as needed.     Diet - low sodium heart healthy    Complete by:  As directed      Discharge instructions    Complete by:  As directed   Pick up stool softner and laxative for home use following surgery while on pain medications. Do not submerge incision under water. Please use good hand washing techniques while changing dressing each day. May shower starting three days after surgery. Please use a clean towel to pat the incision dry following showers. Continue to use ice for pain and swelling after surgery. Do not use any lotions or creams on the incision until instructed by your surgeon.  Take Eliquis twice a day for three more weeks, then discontinue the Eliquis. After completing the Eliquis, then take a baby 81 mg Aspirin daily for three additional weeks.  Postoperative Constipation Protocol  Constipation - defined medically as fewer than three stools per week and severe constipation as less than one stool per week.  One of the most common issues patients have following surgery is constipation.  Even if you have a regular bowel pattern at home, your normal regimen is likely to be disrupted due to multiple reasons following surgery.  Combination of anesthesia, postoperative narcotics, change in appetite and fluid intake all  can affect your bowels.  In order to avoid complications following surgery, here are some recommendations in order to help you during your recovery period.  Colace (docusate) - Pick up an over-the-counter form of Colace or another stool softener and take twice a day as long as you are requiring postoperative pain medications.  Take with a full glass of water daily.  If you experience loose stools or diarrhea, hold the colace until you stool forms back up.  If your symptoms do not get better within 1 week or if they get worse, check with your doctor.  Dulcolax (bisacodyl) - Pick up over-the-counter and take as directed by the product packaging as needed to assist with the movement of  your bowels.  Take with a full glass of water.  Use this product as needed if not relieved by Colace only.   MiraLax (polyethylene glycol) - Pick up over-the-counter to have on hand.  MiraLax is a solution that will increase the amount of water in your bowels to assist with bowel movements.  Take as directed and can mix with a glass of water, juice, soda, coffee, or tea.  Take if you go more than two days without a movement. Do not use MiraLax more than once per day. Call your doctor if you are still constipated or irregular after using this medication for 7 days in a row.  If you continue to have problems with postoperative constipation, please contact the office for further assistance and recommendations.  If you experience "the worst abdominal pain ever" or develop nausea or vomiting, please contact the office immediatly for further recommendations for treatment.     Do not put a pillow under the knee. Place it under the heel.    Complete by:  As directed      Do not sit on low chairs, stoools or toilet seats, as it may be difficult to get up from low surfaces    Complete by:  As directed      Driving restrictions    Complete by:  As directed   No driving until released by the physician.     Increase activity slowly  as tolerated    Complete by:  As directed      Lifting restrictions    Complete by:  As directed   No lifting until released by the physician.     Patient may shower    Complete by:  As directed   You may shower without a dressing once there is no drainage.  Do not wash over the wound.  If drainage remains, do not shower until drainage stops.     TED hose    Complete by:  As directed   Use stockings (TED hose) for 3 weeks on both leg(s).  You may remove them at night for sleeping.     Weight bearing as tolerated    Complete by:  As directed   Laterality:  left  Extremity:  Lower            Medication List    STOP taking these medications        azithromycin 250 MG tablet  Commonly known as:  ZITHROMAX Z-PAK     HYDROcodone-homatropine 5-1.5 MG/5ML syrup  Commonly known as:  HYCODAN      TAKE these medications        ALAWAY 0.025 % ophthalmic solution  Generic drug:  ketotifen  Place 1 drop into both eyes 2 (two) times daily as needed (for itchy eyes).     apixaban 2.5 MG Tabs tablet  Commonly known as:  ELIQUIS  Take 1 tablet (2.5 mg total) by mouth every 12 (twelve) hours. Take Eliquis twice a day for a total of three weeks, then discontinue Eliquis. Once the patient has completed the blood thinner regimen, then take a Baby 81 mg Aspirin daily for three more weeks.     bisoprolol-hydrochlorothiazide 2.5-6.25 MG tablet  Commonly known as:  ZIAC  Take 1 tablet by mouth daily.     diphenhydrAMINE 25 MG tablet  Commonly known as:  BENADRYL  Take 1 tablet (25 mg total) by mouth every 6 (six) hours as needed for itching (Rash).     EPINEPHrine  0.3 mg/0.3 mL Soaj injection  Commonly known as:  EPI-PEN  Inject 0.3 mLs (0.3 mg total) into the muscle once.     methocarbamol 500 MG tablet  Commonly known as:  ROBAXIN  Take 1 tablet (500 mg total) by mouth every 6 (six) hours as needed for muscle spasms.     oxyCODONE 5 MG immediate release tablet  Commonly known as:   Oxy IR/ROXICODONE  Take 1-2 tablets (5-10 mg total) by mouth every 3 (three) hours as needed for moderate pain or severe pain.     traMADol 50 MG tablet  Commonly known as:  ULTRAM  Take 1-2 tablets (50-100 mg total) by mouth every 6 (six) hours as needed (mild pain).     verapamil 240 MG CR tablet  Commonly known as:  CALAN-SR  TAKE ONE (1) TABLET BY MOUTH EVERY DAY       Follow-up Information    Follow up with [facility].   Why:  home health physical therapy   Contact information:   [place] [address] [phone]       Follow up with [name], MD. Schedule an appointment as soon as possible for a visit on 01/11/2016.   Specialty:  Orthopedic Surgery   Why:  Call office at [phone] to setup appointment on Tuesday 3/12 with the PA, Dian Situ.   Contact information:   [address] [place] [address] [phone]       Signed: Arlee Muslim, PA-C Orthopaedic Surgery 01/06/2016, 8:23 AM

## 2015-12-28 NOTE — Evaluation (Signed)
Physical Therapy Evaluation Patient Details Name: [name] MRN: [id number] DOB: [birth year] Today's Date: 12/28/2015   History of Present Illness  Pt is a 75 year old female s/p L TKA, hx of R TKA (2000)  Clinical Impression  Pt is s/p L TKA resulting in the deficits listed below (see PT Problem List).   Pt will benefit from skilled PT to increase their independence and safety with mobility to allow discharge to the venue listed below.  Pt assisted with ambulating in hallway and then performed LE exercises.  Pt plans to d/c home with spouse.     Follow Up Recommendations Home health PT    Equipment Recommendations  None recommended by PT    Recommendations for Other Services       Precautions / Restrictions Precautions Precautions: Knee Required Braces or Orthoses: Knee Immobilizer - Left Knee Immobilizer - Left: Discontinue once straight leg raise with < 10 degree lag Restrictions Other Position/Activity Restrictions: WBAT      Mobility  Bed Mobility Overal bed mobility: Needs Assistance Bed Mobility: Supine to Sit     Supine to sit: Min guard     General bed mobility comments: pt able to self assist L LE using R LE  Transfers Overall transfer level: Needs assistance Equipment used: Rolling walker (2 wheeled) Transfers: Sit to/from Stand Sit to Stand: Min guard         General transfer comment: verbal cues for safe technique  Ambulation/Gait Ambulation/Gait assistance: Min guard Ambulation Distance (Feet): 80 Feet Assistive device: Rolling walker (2 wheeled) Gait Pattern/deviations: Step-to pattern;Antalgic     General Gait Details: verbal cues for step length, sequence, RW distance, posture  Stairs            Wheelchair Mobility    Modified Rankin (Stroke Patients Only)       Balance                                             Pertinent Vitals/Pain Pain Assessment: 0-10 Pain Score: 4  Pain Location: L  knee Pain Descriptors / Indicators: Aching;Sore Pain Intervention(s): Limited activity within patient's tolerance;Monitored during session;Repositioned;Ice applied;Premedicated before session    [place] expects to be discharged to:: Private residence Living Arrangements: Spouse/significant other;Children Available Help at Discharge: Family Type of Home: House Home Access: Stairs to enter Entrance Stairs-Rails: Right Entrance Stairs-Number of Steps: 3 Home Layout: One level [place] - single point;Walker - 2 wheels;Shower seat;Bedside commode      Prior Function Level of Independence: Independent with assistive device(s)         Comments: RW vs cane     Hand Dominance   Dominant Hand: Right    Extremity/Trunk Assessment   Upper Extremity Assessment: Overall WFL for tasks assessed           Lower Extremity Assessment: LLE deficits/detail   LLE Deficits / Details: unable to perform SLR, maintained KI for mobility, knee flexion AAROM 45*     Communication   Communication: No difficulties  Cognition Arousal/Alertness: Awake/alert Behavior During Therapy: WFL for tasks assessed/performed Overall Cognitive Status: Within Functional Limits for tasks assessed                      General Comments      Exercises Total Joint Exercises Ankle Circles/Pumps: AROM;Both;10 reps Quad Sets:  AROM;Both;10 reps Short Arc Quad: AROM;10 reps;Left Heel Slides: AAROM;Left;10 reps Hip ABduction/ADduction: AAROM;Left;10 reps Straight Leg Raises: AAROM;Left;10 reps      Assessment/Plan    PT Assessment Patient needs continued PT services  PT Diagnosis Difficulty walking;Acute pain   PT Problem List Decreased strength;Decreased range of motion;Decreased mobility;Decreased knowledge of precautions;Decreased knowledge of use of DME;Pain  PT Treatment Interventions Functional mobility training;Stair training;Gait training;DME  instruction;Patient/family education;Therapeutic activities;Therapeutic exercise   PT Goals (Current goals can be found in the Care Plan section) Acute Rehab PT Goals Patient Stated Goal: to go home tomorrow PT Goal Formulation: With patient Time For Goal Achievement: 01/01/16 Potential to Achieve Goals: Good    Frequency 7X/week   Barriers to discharge        Co-evaluation               End of Session Equipment Utilized During Treatment: Gait belt Activity Tolerance: Patient tolerated treatment well Patient left: in chair;with chair alarm set;with family/visitor present;with call bell/phone within reach           Time: 0928-0953 PT Time Calculation (min) (ACUTE ONLY): 25 min   Charges:   PT Evaluation $PT Eval Low Complexity: 1 Procedure PT Treatments $Therapeutic Exercise: 8-22 mins   PT G Codes:        Angelmarie Ponzo,KATHrine E 12/28/2015, 1:49 PM Carmelia Bake, PT, DPT 12/28/2015 Pager: [phone]

## 2015-12-28 NOTE — Progress Notes (Signed)
Physical Therapy Treatment Note    12/28/15 1400  PT Visit Information  Last PT Received On 12/28/15  Assistance Needed +1  History of Present Illness Pt is a 75 year old female s/p L TKA, hx of R TKA (2000)  PT Time Calculation  PT Start Time (ACUTE ONLY) 1403  PT Stop Time (ACUTE ONLY) 1418  PT Time Calculation (min) (ACUTE ONLY) 15 min  Subjective Data  Subjective Pt ambulated in hallway again and then assisted back to bed.  Precautions  Precautions Knee  Required Braces or Orthoses Knee Immobilizer - Left  Knee Immobilizer - Left Discontinue once straight leg raise with < 10 degree lag  Restrictions  Other Position/Activity Restrictions WBAT  Pain Assessment  Pain Assessment 0-10  Pain Score 7  Pain Location L knee  Pain Descriptors / Indicators Aching;Sore  Pain Intervention(s) Limited activity within patient's tolerance;Monitored during session;Patient requesting pain meds-RN notified  Cognition  Arousal/Alertness Awake/alert  Behavior During Therapy WFL for tasks assessed/performed  Overall Cognitive Status Within Functional Limits for tasks assessed  Bed Mobility  Overal bed mobility Needs Assistance  Bed Mobility Sit to Supine  Sit to supine Min guard  General bed mobility comments pt able to self assist L LE using R LE  Transfers  Overall transfer level Needs assistance  Equipment used Rolling walker (2 wheeled)  Transfers Sit to/from Stand  Sit to Stand Min guard  General transfer comment verbal cues for safe technique  Ambulation/Gait  Ambulation/Gait assistance Min guard  Ambulation Distance (Feet) 120 Feet  Assistive device Rolling walker (2 wheeled)  Gait Pattern/deviations Step-to pattern;Antalgic  General Gait Details verbal cues for step length, sequence, RW distance, posture  PT - End of Session  Equipment Utilized During Treatment Left knee immobilizer  Activity Tolerance Patient tolerated treatment well  Patient left in bed;with call bell/phone  within reach;with bed alarm set  PT - Assessment/Plan  PT Plan Current plan remains appropriate  PT Frequency (ACUTE ONLY) 7X/week  Follow Up Recommendations Home health PT  PT equipment None recommended by PT  PT Goal Progression  Progress towards PT goals Progressing toward goals  PT General Charges  $$ ACUTE PT VISIT 1 Procedure  PT Treatments  $Gait Training 8-22 mins   Carmelia Bake, PT, DPT 12/28/2015 Pager: [phone]

## 2015-12-28 NOTE — Progress Notes (Signed)
Occupational Therapy Evaluation Patient Details Name: [name] MRN: [id number] DOB: 09-Nov-1940 Today's Date: 12/28/2015    History of Present Illness L TKA, h/o R TKA (2000)   Clinical Impression   Patient presents to OT with decreased ADL independence s/p above procedure. Will benefit from skilled OT to maximize independence and to facilitate a safe discharge. OT will follow.    Follow Up Recommendations  No OT follow up;Supervision/Assistance - 24 hour    Equipment Recommendations  None recommended by OT    Recommendations for Other Services       Precautions / Restrictions Precautions Precautions: Knee Required Braces or Orthoses: Knee Immobilizer - Left Knee Immobilizer - Left: Discontinue once straight leg raise with < 10 degree lag Restrictions Other Position/Activity Restrictions: WBAT      Mobility Bed Mobility               General bed mobility comments: NT -- OOB in recliner  Transfers Overall transfer level: Needs assistance Equipment used: Rolling walker (2 wheeled) Transfers: Sit to/from Stand Sit to Stand: Min guard;From elevated surface              Balance                                            ADL Overall ADL's : Needs assistance/impaired Eating/Feeding: Independent;Sitting   Grooming: Wash/dry hands;Supervision/safety;Standing   Upper Body Bathing: Set up;Sitting   Lower Body Bathing: Minimal assistance;Sit to/from stand   Upper Body Dressing : Set up;Sitting   Lower Body Dressing: Minimal assistance;Sit to/from stand   Toilet Transfer: Min guard;Ambulation;Regular Toilet;BSC;RW   Toileting- Water quality scientist and Hygiene: Min guard;Sit to/from stand       Functional mobility during ADLs: Min guard;Rolling walker       Vision     Perception     Praxis      Pertinent Vitals/Pain Pain Assessment: 0-10 Pain Score: 5  Pain Location: L knee Pain Descriptors / Indicators:  Aching;Sore Pain Intervention(s): Limited activity within patient's tolerance;Monitored during session;Repositioned;Ice applied     Hand Dominance Right   Extremity/Trunk Assessment Upper Extremity Assessment Upper Extremity Assessment: Overall WFL for tasks assessed   Lower Extremity Assessment Lower Extremity Assessment: Defer to PT evaluation       Communication Communication Communication: No difficulties   Cognition Arousal/Alertness: Awake/alert Behavior During Therapy: WFL for tasks assessed/performed Overall Cognitive Status: Within Functional Limits for tasks assessed                     General Comments       Exercises       Shoulder Instructions      Home Living Family/patient expects to be discharged to:: Private residence Living Arrangements: Spouse/significant other;Children Available Help at Discharge: Family Type of Home: House Home Access: Stairs to enter Technical brewer of Steps: 3 Entrance Stairs-Rails: Right Home Layout: One level     Bathroom Shower/Tub: Tub/shower unit;Curtain Shower/tub characteristics: Architectural technologist: Standard     Home Equipment: Cane - single point;Walker - 2 wheels;Shower seat;Bedside commode          Prior Functioning/Environment Level of Independence: Independent with assistive device(s)        Comments: RW vs cane    OT Diagnosis: Acute pain   OT Problem List: Decreased strength;Decreased range of motion;Decreased activity tolerance;Decreased knowledge of  use of DME or AE;Pain   OT Treatment/Interventions: Self-care/ADL training;DME and/or AE instruction;Therapeutic activities;Patient/family education    OT Goals(Current goals can be found in the care plan section) Acute Rehab OT Goals Patient Stated Goal: to go home tomorrow OT Goal Formulation: With patient Time For Goal Achievement: 01/11/16 Potential to Achieve Goals: Good  OT Frequency: Min 2X/week   Barriers to D/C:             Co-evaluation              End of Session Equipment Utilized During Treatment: Rolling walker;Left knee immobilizer Nurse Communication: Mobility status  Activity Tolerance: Patient tolerated treatment well Patient left: in chair;with call bell/phone within reach;with chair alarm set   Time: 1048-1106 OT Time Calculation (min): 18 min Charges:  OT General Charges $OT Visit: 1 Procedure OT Evaluation $OT Eval Low Complexity: 1 Procedure G-Codes:    Candler Ginsberg A 01-15-2016, 12:53 PM

## 2015-12-29 ENCOUNTER — Telehealth: Payer: Self-pay | Admitting: Family Medicine

## 2015-12-29 LAB — CBC
HEMATOCRIT: 32.6 % — AB (ref 36.0–46.0)
Hemoglobin: 10.8 g/dL — ABNORMAL LOW (ref 12.0–15.0)
MCH: 30.5 pg (ref 26.0–34.0)
MCHC: 33.1 g/dL (ref 30.0–36.0)
MCV: 92.1 fL (ref 78.0–100.0)
Platelets: 194 10*3/uL (ref 150–400)
RBC: 3.54 MIL/uL — AB (ref 3.87–5.11)
RDW: 13.3 % (ref 11.5–15.5)
WBC: 9.6 10*3/uL (ref 4.0–10.5)

## 2015-12-29 LAB — BASIC METABOLIC PANEL
ANION GAP: 9 (ref 5–15)
BUN: 12 mg/dL (ref 6–20)
CHLORIDE: 105 mmol/L (ref 101–111)
CO2: 25 mmol/L (ref 22–32)
Calcium: 8.3 mg/dL — ABNORMAL LOW (ref 8.9–10.3)
Creatinine, Ser: 0.71 mg/dL (ref 0.44–1.00)
GFR calc Af Amer: >60 mL/min (ref >60)
GLUCOSE: 135 mg/dL — AB (ref 65–99)
POTASSIUM: 3.6 mmol/L (ref 3.5–5.1)
Sodium: 139 mmol/L (ref 135–145)

## 2015-12-29 NOTE — Progress Notes (Signed)
Occupational Therapy Treatment Patient Details Name: [name] MRN: [id number] DOB: 24-Jan-1941 Today's Date: 12/29/2015    History of present illness Pt is a 75 year old female s/p L TKA, hx of R TKA (2000)   OT comments  All OT education completed and pt questions answered. OT will sign off.  Follow Up Recommendations  No OT follow up;Supervision/Assistance - 24 hour    Equipment Recommendations  None recommended by OT    Recommendations for Other Services      Precautions / Restrictions Precautions Precautions: Knee Required Braces or Orthoses: Knee Immobilizer - Left Knee Immobilizer - Left: Discontinue once straight leg raise with < 10 degree lag Restrictions Weight Bearing Restrictions: No Other Position/Activity Restrictions: WBAT       Mobility Bed Mobility Overal bed mobility: Needs Assistance Bed Mobility: Supine to Sit     Supine to sit: Supervision     General bed mobility comments: pt able to self assist L LE using R LE  Transfers Overall transfer level: Needs assistance Equipment used: Rolling walker (2 wheeled) Transfers: Sit to/from Stand Sit to Stand: Supervision              Balance                                   ADL Overall ADL's : Needs assistance/impaired         Upper Body Bathing: Set up;Sitting   Lower Body Bathing: Minimal assistance;Sit to/from stand   Upper Body Dressing : Set up;Sitting   Lower Body Dressing: Minimal assistance;Sit to/from stand   Toilet Transfer: Supervision/safety;Stand-pivot;BSC;RW   Toileting- Water quality scientist and Hygiene: Supervision/safety;Sit to/from stand       Functional mobility during ADLs: Supervision/safety;Rolling walker General ADL Comments: Patient performed sponge bath, donned regular clothes, toileted during session. Reviewed tub transfer verbally with patient; she declined to practice during session. No further OT needs -- will sign off.       Vision                     Perception     Praxis      Cognition   Behavior During Therapy: WFL for tasks assessed/performed Overall Cognitive Status: Within Functional Limits for tasks assessed                       Extremity/Trunk Assessment               Exercises     Shoulder Instructions       General Comments      Pertinent Vitals/ Pain       Pain Assessment: 0-10 Pain Score: 6  Pain Location: L knee Pain Descriptors / Indicators: Aching;Sore Pain Intervention(s): Limited activity within patient's tolerance;Monitored during session;Premedicated before session;Repositioned  Home Living                                          Prior Functioning/Environment              Frequency       Progress Toward Goals  OT Goals(current goals can now be found in the care plan section)  Progress towards OT goals: Goals met/education completed, patient discharged from [place] goals met and education  completed, patient discharged from OT services    Co-evaluation                 End of Session Equipment Utilized During Treatment: Rolling walker;Left knee immobilizer   Activity Tolerance Patient tolerated treatment well   Patient Left in chair;with call bell/phone within reach;with chair alarm set   Nurse Communication Mobility status        Time: 5885-0277 OT Time Calculation (min): 23 min  Charges: OT General Charges $OT Visit: 1 Procedure OT Treatments $Self Care/Home Management : 23-37 mins  Early, Leila A 12/29/2015, 11:45 AM

## 2015-12-29 NOTE — Progress Notes (Signed)
   Subjective: 2 Days Post-Op Procedure(s) (LRB): LEFT TOTAL KNEE ARTHROPLASTY (Left) Patient reports pain as mild.   Patient seen in rounds with Dr. Wynelle Link. Patient is well, and has had no acute complaints or problems Patient is ready to go home  Objective: Vital signs in last 24 hours: Temp:  [97.6 F (36.4 C)-99.8 F (37.7 C)] 99.8 F (37.7 C) (03/08 0643) Pulse Rate:  [59-86] 86 (03/08 0643) Resp:  [16-20] 20 (03/08 0643) BP: (137-151)/(49-58) 142/58 mmHg (03/08 0643) SpO2:  [94 %-97 %] 94 % (03/08 0643)  Intake/Output from previous day:  Intake/Output Summary (Last 24 hours) at 12/29/15 0727 Last data filed at 12/29/15 0600  Gross per 24 hour  Intake 1029.25 ml  Output    250 ml  Net 779.25 ml   Labs:  Recent Labs  12/28/15 0349 12/29/15 0409  HGB 11.8* 10.8*    Recent Labs  12/28/15 0349 12/29/15 0409  WBC 10.7* 9.6  RBC 3.89 3.54*  HCT 35.5* 32.6*  PLT 216 194    Recent Labs  12/28/15 0349 12/29/15 0409  NA 140 139  K 3.8 3.6  CL 109 105  CO2 22 25  BUN 13 12  CREATININE 0.78 0.71  GLUCOSE 176* 135*  CALCIUM 8.8* 8.3*   No results for input(s): LABPT, INR in the last 72 hours.  EXAM: General - Patient is Alert and Appropriate Extremity - Neurovascular intact Sensation intact distally Incision - clean, dry Motor Function - intact, moving foot and toes well on exam.   Assessment/Plan: 2 Days Post-Op Procedure(s) (LRB): LEFT TOTAL KNEE ARTHROPLASTY (Left) Procedure(s) (LRB): LEFT TOTAL KNEE ARTHROPLASTY (Left) Past Medical History  Diagnosis Date  . Hypertension   . Arthritis   . Allergy   . Hyperlipidemia   . Angioedema   . Hx of echocardiogram 10/2005    normal  . GERD (gastroesophageal reflux disease)   . Headache   . 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   Weight as of this encounter: 86.637 kg (191 lb). Up with therapy Discharge home with home health Diet - Cardiac diet Follow up - in 2 weeks Activity - WBAT Disposition - Home Condition Upon Discharge - Good D/C Meds - See DC Summary DVT Prophylaxis - Eliquis  Arlee Muslim, PA-C Orthopaedic Surgery 12/29/2015, 7:27 AM

## 2015-12-29 NOTE — Telephone Encounter (Signed)
Pt was issued Eliquis 2.5 mg BID but her insurance does not cover it She went home today and they are trying to reach that doc but unable to.  Afraid to not take something is there anything that you can call in for her that Her insurance will pay for?   [facility]

## 2015-12-29 NOTE — Progress Notes (Signed)
Physical Therapy Treatment Patient Details Name: HALYNN POLINSKY MRN: [id number] DOB: 11-03-1940 Today's Date: 12/29/2015    History of Present Illness Pt is a 75 year old female s/p L TKA, hx of R TKA (2000)    PT Comments    Pt ambulated in hallway and practiced safe stair technique.  Spouse into room end of session so provided stair handout for him to review to assist pt home.  Pt also performed a few LE exercises.  Pt had no further questions and feels ready for d/c home today.   Follow Up Recommendations  Home health PT     Equipment Recommendations  None recommended by PT    Recommendations for Other Services       Precautions / Restrictions Precautions Precautions: Knee Required Braces or Orthoses: Knee Immobilizer - Left Knee Immobilizer - Left: Discontinue once straight leg raise with < 10 degree lag Restrictions Weight Bearing Restrictions: No Other Position/Activity Restrictions: WBAT    Mobility  Bed Mobility Overal bed mobility: Needs Assistance Bed Mobility: Supine to Sit     Supine to sit: Supervision     General bed mobility comments: pt up in recliner on arrival  Transfers Overall transfer level: Needs assistance Equipment used: Rolling walker (2 wheeled) Transfers: Sit to/from Stand Sit to Stand: Supervision         General transfer comment: verbal cues for safe technique  Ambulation/Gait Ambulation/Gait assistance: Supervision Ambulation Distance (Feet): 120 Feet Assistive device: Rolling walker (2 wheeled) Gait Pattern/deviations: Step-to pattern;Antalgic     General Gait Details: verbal cues for step length, sequence, RW distance, posture   Stairs Stairs: Yes Stairs assistance: Min assist Stair Management: Forwards;With walker;Step to pattern Number of Stairs: 2 (and 3) General stair comments: pt attempted forwards with RW and required assist for safety and steadying, performed again backwards with RW and pt educated on safe  technique, sequence, RW positioning, pt reports understanding, provided handout for spouse to review when he arrives  Wheelchair Mobility    Modified Rankin (Stroke Patients Only)       Balance                                    Cognition Arousal/Alertness: Awake/alert Behavior During Therapy: WFL for tasks assessed/performed Overall Cognitive Status: Within Functional Limits for tasks assessed                      Exercises Total Joint Exercises Ankle Circles/Pumps: AROM;Both;10 reps Quad Sets: AROM;Both;10 reps Heel Slides: AAROM;Left;10 reps Straight Leg Raises: AAROM;Left;10 reps    General Comments        Pertinent Vitals/Pain Pain Assessment: 0-10 Pain Score: 5  Pain Location: L knee Pain Descriptors / Indicators: Aching;Sore Pain Intervention(s): Limited activity within patient's tolerance;Monitored during session;Premedicated before session;Repositioned;Ice applied    Home Living                      Prior Function            PT Goals (current goals can now be found in the care plan section) Progress towards PT goals: Progressing toward goals    Frequency  7X/week    PT Plan Current plan remains appropriate    Co-evaluation             End of Session Equipment Utilized During Treatment: Left knee immobilizer Activity Tolerance: Patient tolerated  treatment well Patient left: in chair;with call bell/phone within reach;with chair alarm set;with family/visitor present     Time: [id number] PT Time Calculation (min) (ACUTE ONLY): 24 min  Charges:  $Gait Training: 23-37 mins                    G Codes:      Candus Braud,KATHrine E 2016-01-07, 1:00 PM Carmelia Bake, PT, DPT January 07, 2016 Pager: [phone]

## 2015-12-29 NOTE — Telephone Encounter (Signed)
Spoke with Margarita Grizzle at Dr Aluisio's office who states they have already been contacted about the matter and they will contact the pharmacy and handle the situation. Patient notified

## 2015-12-30 DIAGNOSIS — E785 Hyperlipidemia, unspecified: Secondary | ICD-10-CM | POA: Diagnosis not present

## 2015-12-30 DIAGNOSIS — I10 Essential (primary) hypertension: Secondary | ICD-10-CM | POA: Diagnosis not present

## 2015-12-30 DIAGNOSIS — Z96653 Presence of artificial knee joint, bilateral: Secondary | ICD-10-CM | POA: Diagnosis not present

## 2015-12-30 DIAGNOSIS — Z79891 Long term (current) use of opiate analgesic: Secondary | ICD-10-CM | POA: Diagnosis not present

## 2015-12-30 DIAGNOSIS — Z9049 Acquired absence of other specified parts of digestive tract: Secondary | ICD-10-CM | POA: Diagnosis not present

## 2015-12-30 DIAGNOSIS — Z471 Aftercare following joint replacement surgery: Secondary | ICD-10-CM | POA: Diagnosis not present

## 2015-12-31 DIAGNOSIS — Z79891 Long term (current) use of opiate analgesic: Secondary | ICD-10-CM | POA: Diagnosis not present

## 2015-12-31 DIAGNOSIS — E785 Hyperlipidemia, unspecified: Secondary | ICD-10-CM | POA: Diagnosis not present

## 2015-12-31 DIAGNOSIS — I10 Essential (primary) hypertension: Secondary | ICD-10-CM | POA: Diagnosis not present

## 2015-12-31 DIAGNOSIS — Z471 Aftercare following joint replacement surgery: Secondary | ICD-10-CM | POA: Diagnosis not present

## 2015-12-31 DIAGNOSIS — Z96653 Presence of artificial knee joint, bilateral: Secondary | ICD-10-CM | POA: Diagnosis not present

## 2015-12-31 DIAGNOSIS — Z9049 Acquired absence of other specified parts of digestive tract: Secondary | ICD-10-CM | POA: Diagnosis not present

## 2016-01-03 DIAGNOSIS — I10 Essential (primary) hypertension: Secondary | ICD-10-CM | POA: Diagnosis not present

## 2016-01-03 DIAGNOSIS — Z471 Aftercare following joint replacement surgery: Secondary | ICD-10-CM | POA: Diagnosis not present

## 2016-01-03 DIAGNOSIS — Z96653 Presence of artificial knee joint, bilateral: Secondary | ICD-10-CM | POA: Diagnosis not present

## 2016-01-03 DIAGNOSIS — Z79891 Long term (current) use of opiate analgesic: Secondary | ICD-10-CM | POA: Diagnosis not present

## 2016-01-03 DIAGNOSIS — E785 Hyperlipidemia, unspecified: Secondary | ICD-10-CM | POA: Diagnosis not present

## 2016-01-03 DIAGNOSIS — Z9049 Acquired absence of other specified parts of digestive tract: Secondary | ICD-10-CM | POA: Diagnosis not present

## 2016-01-05 DIAGNOSIS — Z79891 Long term (current) use of opiate analgesic: Secondary | ICD-10-CM | POA: Diagnosis not present

## 2016-01-05 DIAGNOSIS — I10 Essential (primary) hypertension: Secondary | ICD-10-CM | POA: Diagnosis not present

## 2016-01-05 DIAGNOSIS — Z471 Aftercare following joint replacement surgery: Secondary | ICD-10-CM | POA: Diagnosis not present

## 2016-01-05 DIAGNOSIS — Z9049 Acquired absence of other specified parts of digestive tract: Secondary | ICD-10-CM | POA: Diagnosis not present

## 2016-01-05 DIAGNOSIS — E785 Hyperlipidemia, unspecified: Secondary | ICD-10-CM | POA: Diagnosis not present

## 2016-01-05 DIAGNOSIS — Z96653 Presence of artificial knee joint, bilateral: Secondary | ICD-10-CM | POA: Diagnosis not present

## 2016-01-10 ENCOUNTER — Ambulatory Visit (HOSPITAL_COMMUNITY): Payer: Medicare Other | Attending: Orthopedic Surgery

## 2016-01-10 DIAGNOSIS — R2681 Unsteadiness on feet: Secondary | ICD-10-CM | POA: Diagnosis not present

## 2016-01-10 DIAGNOSIS — R269 Unspecified abnormalities of gait and mobility: Secondary | ICD-10-CM

## 2016-01-10 DIAGNOSIS — M25562 Pain in left knee: Secondary | ICD-10-CM

## 2016-01-10 DIAGNOSIS — Z96652 Presence of left artificial knee joint: Secondary | ICD-10-CM

## 2016-01-10 DIAGNOSIS — M6281 Muscle weakness (generalized): Secondary | ICD-10-CM

## 2016-01-10 DIAGNOSIS — M25662 Stiffness of left knee, not elsewhere classified: Secondary | ICD-10-CM | POA: Diagnosis not present

## 2016-01-10 NOTE — Therapy (Signed)
[facility] [place], [place], [address] Phone: [phone]   Fax:  [phone]  Physical Therapy Evaluation  Patient Details  Name: [name] MRN: [id number] Date of Birth: Oct 24, 1940 Referring Provider: Dr. Yehuda Mao  Encounter Date: 01/10/2016      PT End of Session - 01/10/16 1803    Visit Number 1   Number of Visits 22   Date for PT Re-Evaluation 02/09/16   Authorization Type [employer] Medicare    Authorization Time Period 01/10/2016 to 02/28/2016    PT Start Time 1431   PT Stop Time 1515   PT Time Calculation (min) 44 min   Activity Tolerance Patient tolerated treatment well   Behavior During Therapy [facility] for tasks assessed/performed      Past Medical History  Diagnosis Date  . Hypertension   . Arthritis   . Allergy   . Hyperlipidemia   . Angioedema   . Hx of echocardiogram 10/2005    normal  . GERD (gastroesophageal reflux disease)   . Headache   . Thyroid disease     treated with radiation in 1980s     Past Surgical History  Procedure Laterality Date  . Abdominal hysterectomy    . Joint replacement    . Tubal ligation    . Colonoscopy  06/15/2011    Procedure: COLONOSCOPY;  Surgeon: Rogene Houston, MD;  Location: [facility];  Service: Endoscopy;  Laterality: N/A;  . Replacement total knee Right 2000  . Cholecystectomy    . Eye surgery      removed cyst from right eye in june 2015. sept 9th surgery for eye lid drop on both eyes  . 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  Visit Diagnosis:  Status post left knee replacement - Plan: PT plan of care cert/re-cert  Left knee pain - Plan: PT plan of care cert/re-cert  Stiffness of left knee - Plan: PT plan of care cert/re-cert  Muscle weakness - Plan: PT plan of care  cert/re-cert  Abnormality of gait - Plan: PT plan of care cert/re-cert  Unsteadiness - Plan: PT plan of care cert/re-cert      Subjective Assessment - 01/10/16 1438    Subjective [name] is a 75 yo female who currently c/o L knee pain, L knee stiffness, and difficulty walking s/p recent L TKA completed on 12/27/2015. Pt noted that she is currently ambulating with her rollator walker and completes ADLs/IADLs with minor assistance required by her husband with lower body dressing. Pt reported that she participated with three home PT visits after being discharged home from the hospital and was recently discharged. Pt reports that her pain is well managed at this time with pain meds and rest. Pt noted minor compliance with her initial HEP and was unable to recall her initial homecare PT HEP without her handout present. She reports that she is planned to see Dr. Wynelle Link tomorrow for a f/u appt.    Patient is accompained by: Family member   Pertinent History HTN, hyperlipdemia, and R TKA    Limitations Sitting;Standing;Walking   How long can you sit comfortably? 1 hour    How long can you stand comfortably? 10 minutes    How long can you walk comfortably? 5 minutes  Diagnostic tests N/A    Patient Stated Goals Pt's goal is to ambulate without an AD, reduce her pain, and return to recereational activities ( Bocce, pickle ball, and horseshoes)    Currently in Pain? Yes   Pain Score 4   L knee pain has ranged between a 2-7/10 on a VAS    Pain Location Knee   Pain Orientation Left;Anterior   Pain Descriptors / Indicators Aching;Burning;Tightness;Stabbing   Pain Type Surgical pain   Pain Radiating Towards None    Pain Onset 1 to 4 weeks ago   Pain Frequency Constant   Aggravating Factors  Max L knee flexion and completing ther ex    Pain Relieving Factors rest, elevation, pain meds   Effect of Pain on Daily Activities Difficulty with long distance ambulation indoors/outdoors, difficulty with  dressing lower body, and difficulty with transfers    Multiple Pain Sites No            [facility] PT Assessment - 01/10/16 0001    Assessment   Medical Diagnosis s/p L TKA    Referring Provider Dr. Yehuda Mao   Onset Date/Surgical Date 12/27/15   Hand Dominance Right   Next MD Visit 01/11/2016    Prior Therapy Homecare PT ( 3 visits)    Precautions   Precautions Fall   Restrictions   Weight Bearing Restrictions No   Balance Screen   Has the patient fallen in the past 6 months Yes   How many times? 2   Has the patient had a decrease in activity level because of a fear of falling?  Yes   Is the patient reluctant to leave their home because of a fear of falling?  No   Home Environment   Living Environment Private residence   Living Arrangements Spouse/significant other   Available Help at Discharge Family   Type of [place] to enter   Entrance Stairs-Number of Steps 4 steps with handrail    Home Layout One level   [place] - 4 wheels;Walker - 2 wheels;Cane - single point;Shower seat;Grab bars - tub/shower;Hand held shower head   Prior Function   Level of Independence Independent;Independent with basic ADLs;Independent with community mobility with device   Vocation Retired   Associate Professor   Overall Cognitive Status Within Functional Limits for tasks assessed   Observation/Other Assessments   Focus on Therapeutic Outcomes (FOTO)  72% limited    Observation/Other Assessments-Edema    Edema --  Moderate L knee edema assessed upon visual inspection, bilat   Sensation   Light Touch Appears Intact   Posture/Postural Control   Posture/Postural Control Postural limitations   Postural Limitations Forward head;Rounded Shoulders   ROM / Strength   AROM / PROM / Strength AROM;PROM;Strength   AROM   AROM Assessment Site Knee   Right/Left Knee Left;Right   Left Knee Extension 14   Left Knee Flexion 68   PROM   PROM Assessment Site Knee    Right/Left Knee Right;Left   Left Knee Extension 12   Left Knee Flexion 72   Strength   Strength Assessment Site Knee;Hip   Right/Left Hip Left   Left Hip Flexion 3+/5   Left Hip Extension 3-/5   Left Hip ABduction 3+/5   Right/Left Knee Left;Right   Right Knee Flexion 4/5   Right Knee Extension 4/5   Left Knee Flexion 3+/5   Left Knee Extension 3+/5   Flexibility   Soft Tissue Assessment /Muscle  Length yes   Hamstrings HS 90/90 (L/R)= -30 deg/-27 deg    Palpation   Patella mobility Mod limitations with L patellar mobility in S<>I and M<>L direction    Palpation comment Tender to palpation assessed at distal quad muscle and lateral L knee    Bed Mobility   Bed Mobility Rolling Right;Rolling Left;Supine to Sit;Sit to Supine   Rolling Right 6: Modified independent (Device/Increase time)   Rolling Left 6: Modified independent (Device/Increase time)   Supine to Sit 6: Modified independent (Device/Increase time)   Sit to Supine 6: Modified independent (Device/Increase time)   Transfers   Transfers Sit to Stand;Stand to Sit;Stand Pivot Transfers   Sit to Stand 5: Supervision   Sit to Stand Details Verbal cues for precautions/safety   Stand to Sit 5: Supervision   Stand to Sit Details (indicate cue type and reason) Verbal cues for precautions/safety   Stand Pivot Transfers 5: Supervision   Stand Pivot Transfer Details (indicate cue type and reason) Verbal cues for precautions/safety   Ambulation/Gait   Ambulation/Gait Yes   Ambulation/Gait Assistance 5: Supervision   Ambulation Distance (Feet) 144 Feet   Assistive device 4-wheeled walker   Gait Pattern Step-through pattern;Decreased stance time - left;Decreased hip/knee flexion - left;Left flexed knee in stance;Trunk flexed  limited L TKE at initial contact   Ambulation Surface Level   Timed Up and Go Test   Normal TUG (seconds) 24   TUG Comments with use of rollator walker                    [facility] Adult PT  Treatment/Exercise - 01/10/16 0001    Exercises   Exercises Knee/Hip   Knee/Hip Exercises: Supine   Quad Sets Strengthening;Left;1 set;10 reps   Quad Sets Limitations with 5 sec hold    Short Arc [employer] Strengthening;Left;1 set;10 reps   Short Arc [employer] Limitations with 5 sec hold    Heel Slides AAROM;Left;1 set;10 reps   Heel Slides Limitations with 5 sec hold   with manual assist provided    Other Supine Knee/Hip Exercises Supine hip abduction x 1 set of 10 reps   Other Supine Knee/Hip Exercises ankle pumps x 15 reps                PT Education - 01/10/16 1800    Education provided Yes   Education Details Educated pt on initial TKA HEP with handout provided, pain management strategies (cryotherapy 15-20 minutes, 3-4x/day), completion of HEP 45 minutes after pain meds taken, use of AD at all times, and L LE elevation to reduce edema.    Person(s) Educated Patient   Methods Explanation;Demonstration;Handout   Comprehension Verbalized understanding;Returned demonstration          PT Short Term Goals - 01/10/16 1817    PT SHORT TERM GOAL #1   Title Patient will independently verbalzie and demo initial TKA HEP in order to improve compliance and progression towards goals.    Time 3   Period Weeks   Status New   PT SHORT TERM GOAL #2   Title Patient will independently verbalize pain management strategies including use of cryotherapy in order to manage pain levels with ADLs and functional mobility activities.   Time 2   Period Weeks   Status New   PT SHORT TERM GOAL #3   Title Patient will report reduced max L knee pain to 5/10 on a VAS in order to improve tolerance with ther ex and  indoor ambulation.   Time 4   Period Weeks   Status New   PT SHORT TERM GOAL #4   Title Patient will demo improved L knee AAROM to 5-100 degrees in order to improve L knee mobility and ROM with ambulation and transfers.   Time 4   Period Weeks   Status New   PT SHORT TERM GOAL #5    Title Patient will be able to complete all transfers independently with improved safety awareness in order to return to her PLOF and reduce the risk for falls.   Time 3   Period Weeks   Status New           PT Long Term Goals - 01/10/16 1820    PT LONG TERM GOAL #1   Title Patient will demo improved L knee AAROM to 0-115 degrees in order to improve L knee mobility and ROM with stair climbing and leisure activities.     Time 7   Period Weeks   Status New   PT LONG TERM GOAL #2   Title Patient will demo improved L quad and HS strength to >4/5 MMT in order to improve performance with outdoor ambulation and transfers.   Time 7   Period Weeks   Status New   PT LONG TERM GOAL #3   Title TUG time will improve to <14 seconds with the use of a SPC in order to reduce the risk for falls.     Time 7   Period Weeks   Status New   PT LONG TERM GOAL #4   Title Patient will independently ambulate with a SPC with good heel to toe sequence on level terrain for >600' in order to progress towards becoming a community ambulator.     Time 7   Period Weeks   Status New   PT LONG TERM GOAL #5   Title Patient will improve her FOTO limitation to <50% in order to progress towards her PLOF with recreational activities.    Time 7   Period Weeks   Status New   Additional Long Term Goals   Additional Long Term Goals Yes   PT LONG TERM GOAL #6   Title Patient will report decreased L knee pain to 0-5/10 on a VAS in order to improve quality of life and to be able to complete standing activities for >30 minutes.   Time 7   Period Weeks   Status New               Plan - 01/10/16 1805    Clinical Impression Statement Mrs. Koen is a 75 yo female who was referred to outpatient PT to continue with post-op rehab s/p L TKA completed on 12/27/2015 by Dr. Gaynelle Arabian. The patient presents with impairments and limitations that are consistent with s/p L TKA including anterior L knee pain, impaired  strength of L quad/HS, impaired L patellar mobility in S<>I and M <>L direction, impaired L knee AROM/PROM, impaired L quad/HS flexibility, abnormal gait pattern, L knee edema and impaired dynamic balance. Palpable tenderness assessed of the L quad muscle. TUG time was 24 seconds with rollator walker, which indicates a high risk for falls. Verbal cues required to improve safety awareness during transfers. Reviewed and completed initial TKA HEP as documented with fair tolerance reported. HEP was initiated this visit with good understanding verbalized and demo. The pt would benefit from skilled PT in order to improve L knee AROM/PROM, L quad/HS strength, L hip  strength, L quad/HS flexibility, patellar mobility, reduce edema, and reduce pain. Her surgical incision over the L knee was covered with steri-strips and a small dressing; therefore, surgical incision was not formally assessed this visit. Pt was thoroughly instructed and educated on s/s of infection with appropriate action plan. Pt is in agreement with proposed PT POC and frequency.    Pt will benefit from skilled therapeutic intervention in order to improve on the following deficits Abnormal gait;Decreased range of motion;Difficulty walking;Decreased safety awareness;Decreased endurance;Decreased activity tolerance;Pain;Impaired flexibility;Hypomobility;Decreased balance;Decreased mobility;Decreased strength;Increased edema;Postural dysfunction   Rehab Potential Good   Clinical Impairments Affecting Rehab Potential Surgical L knee pain    PT Frequency 3x / week   PT Duration Other (comment)  7 weeks    PT Treatment/Interventions ADLs/Self Care Home Management;Cryotherapy;Electrical Stimulation;Moist Heat;Therapeutic exercise;Balance training;Therapeutic activities;Functional mobility training;Stair training;Gait training;DME Instruction;Neuromuscular re-education;Patient/family education;Manual techniques;Taping;Passive range of motion   PT Next Visit  Plan Next visit to focus on L knee AAROM/PROM/AROM ther ex, quad sets, SAQ, SLR into flex/abd/ext, gait training with rollator walker, and pt education.    PT Home Exercise Plan Initial TKA HEP provided this visit including ankle pumps, quad sets, SAQ, hip abduction, and heel slides. Review initial HEP at next PT visit.    Recommended Other Services None at this time    Consulted and Agree with Plan of Care Patient          G-Codes - 01-15-16 1829    Functional Assessment Tool Used FOTO and clincial findings including ROM, MMT, pain, edema, gait deviations, and joint mobility    Functional Limitation Mobility: Walking and moving around   Mobility: Walking and Moving Around Current Status [phone]) At least 60 percent but less than 80 percent impaired, limited or restricted   Mobility: Walking and Moving Around Goal Status [phone]) At least 40 percent but less than 60 percent impaired, limited or restricted       Problem List Patient Active Problem List   Diagnosis Date Noted  . OA (osteoarthritis) of knee 12/27/2015  . Screening for osteoporosis 02/25/2014  . 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 Allergic reaction 05/13/2013  . Hyperlipemia 04/24/2013  . Hypertension 04/24/2013    Garen Lah, PT, DPT 2016-01-15, 6:40 PM  [facility] [place], [place], [address] Phone: [phone]   Fax:  [phone]  Name: SHARNITA HENDRICH MRN: [id number] Date of Birth: June 28, 1941

## 2016-01-10 NOTE — Patient Instructions (Signed)
Quad Sets    Squeeze pelvic floor and hold. Tighten top of left thigh. Hold for 5 seconds. Relax for 5 seconds. Repeat 10-15 times. Do 2-3 times a day.    Copyright  VHI. All rights reserved.   Heel Slides    Slide the left heel up the bed, and hold for 3 seconds. Slide back to flat knee position. Repeat 10-15 times. Do 2-3 times a day.     Copyright  VHI. All rights reserved.   KNEE: Extension, Short Arc Quads - Supine    Place bolster under knees. Raise one leg until knee is straight. Repeat 10-15 times. Do 2-3 sessions per day. Copyright  VHI. All rights reserved.   Abduction    Slide the left leg out to side. Keep kneecap pointing up. Gently bring leg back to pillow. Repeat with other leg. Repeat 15 times. Do 2-3 sessions per day.  http://[email or web address].exer.us/374   Copyright  VHI. All rights reserved.   ANKLE: Pumps    Point toes down, then up. 20 reps per set, 3 sets per day, 7 days per week   Copyright  VHI. All rights reserved.

## 2016-01-12 ENCOUNTER — Ambulatory Visit (HOSPITAL_COMMUNITY): Payer: Medicare Other | Admitting: Physical Therapy

## 2016-01-12 DIAGNOSIS — R269 Unspecified abnormalities of gait and mobility: Secondary | ICD-10-CM

## 2016-01-12 DIAGNOSIS — M6281 Muscle weakness (generalized): Secondary | ICD-10-CM | POA: Diagnosis not present

## 2016-01-12 DIAGNOSIS — M25562 Pain in left knee: Secondary | ICD-10-CM

## 2016-01-12 DIAGNOSIS — Z96652 Presence of left artificial knee joint: Secondary | ICD-10-CM | POA: Diagnosis not present

## 2016-01-12 DIAGNOSIS — R2681 Unsteadiness on feet: Secondary | ICD-10-CM | POA: Diagnosis not present

## 2016-01-12 DIAGNOSIS — M25662 Stiffness of left knee, not elsewhere classified: Secondary | ICD-10-CM

## 2016-01-12 NOTE — Therapy (Signed)
[place], [place], [address] Phone: [phone]   Fax:  [phone]  Physical Therapy Treatment  Patient Details  Name: [name] MRN: [id number] Date of Birth: 05-16-1941 Referring Provider: Dr. Yehuda Mao  Encounter Date: 01/12/2016      PT End of Session - 01/12/16 1038    Visit Number 2   Number of Visits 22   Date for PT Re-Evaluation 02/09/16   Authorization Type [employer] Medicare    Authorization Time Period 01/10/2016 to 02/28/2016    PT Start Time 0935   PT Stop Time 1013   PT Time Calculation (min) 38 min   Activity Tolerance Patient tolerated treatment well   Behavior During Therapy [facility] for tasks assessed/performed      Past Medical History  Diagnosis Date  . Hypertension   . Arthritis   . Allergy   . Hyperlipidemia   . Angioedema   . Hx of echocardiogram 10/2005    normal  . GERD (gastroesophageal reflux disease)   . Headache   . Thyroid disease     treated with radiation in 1980s     Past Surgical History  Procedure Laterality Date  . Abdominal hysterectomy    . Joint replacement    . Tubal ligation    . Colonoscopy  06/15/2011    Procedure: COLONOSCOPY;  Surgeon: Rogene Houston, MD;  [facility];  Service: Endoscopy;  Laterality: N/A;  . Replacement total knee Right 2000  . Cholecystectomy    . Eye surgery      removed cyst from right eye in june 2015. sept 9th surgery for eye lid drop on both eyes  . 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  Visit Diagnosis:  Status post left knee replacement  Left knee pain  Stiffness of left knee  Muscle weakness  Abnormality of gait  Unsteadiness      Subjective Assessment - 01/12/16 0941    Subjective Pt notes she is having some pain  today. It feels a little stiff as well. She is performing her HEP without issues.   Pertinent History HTN, hyperlipdemia, and R TKA    Patient Stated Goals Pt's goal is to ambulate without an AD, reduce her pain, and return to recereational activities ( Bocce, pickle ball, and horseshoes)    Currently in Pain? Yes   Pain Score 4    Pain Location Knee   Pain Orientation Left;Anterior   Pain Descriptors / Indicators Aching;Tightness   Pain Type Surgical pain   Pain Radiating Towards nne                         [facility] Adult PT Treatment/Exercise - 01/12/16 0001    Knee/Hip Exercises: Stretches   Gastroc Stretch Left;2 reps;30 seconds   Knee/Hip Exercises: Supine   Quad Sets Strengthening;Left;1 set;10 reps   [employer] Limitations 5 sec hold   Short Arc [employer] Strengthening;Left;1 set;10 reps;2 sets   [employer] Limitations 5 sec hold   Heel Slides AAROM;Left;1 set;10 reps   Heel Slides Limitations 5 sec hold   Other Supine Knee/Hip Exercises Supine hip abduction x 1 set of 10 reps   Manual Therapy   Manual Therapy Edema management;Joint  mobilization;Soft tissue mobilization   Manual therapy comments Performed separatelty from all other interventions   Edema Management reviewed retrograde massage technique with pt returning demonstration   Joint Mobilization Grade I-III tibiofemoral AP mobs with pt in supine   Soft tissue mobilization rolling along distal and proximal quad for 4 min                PT Education - 01/12/16 1037    Education provided Yes   Education Details reviewed HEP, updated with seated knee extension stretch; reviewed use of ice 15-20 min, 3-4x/day along with elevation and retrograde massage to reduce edema   Person(s) Educated Patient   Methods Explanation;Demonstration   Comprehension Verbalized understanding;Returned demonstration          PT Short Term Goals - 01/10/16 1817    PT SHORT TERM GOAL #1   Title Patient  will independently verbalzie and demo initial TKA HEP in order to improve compliance and progression towards goals.    Time 3   Period Weeks   Status New   PT SHORT TERM GOAL #2   Title Patient will independently verbalize pain management strategies including use of cryotherapy in order to manage pain levels with ADLs and functional mobility activities.   Time 2   Period Weeks   Status New   PT SHORT TERM GOAL #3   Title Patient will report reduced max L knee pain to 5/10 on a VAS in order to improve tolerance with ther ex and indoor ambulation.   Time 4   Period Weeks   Status New   PT SHORT TERM GOAL #4   Title Patient will demo improved L knee AAROM to 5-100 degrees in order to improve L knee mobility and ROM with ambulation and transfers.   Time 4   Period Weeks   Status New   PT SHORT TERM GOAL #5   Title Patient will be able to complete all transfers independently with improved safety awareness in order to return to her PLOF and reduce the risk for falls.   Time 3   Period Weeks   Status New           PT Long Term Goals - 01/10/16 1820    PT LONG TERM GOAL #1   Title Patient will demo improved L knee AAROM to 0-115 degrees in order to improve L knee mobility and ROM with stair climbing and leisure activities.     Time 7   Period Weeks   Status New   PT LONG TERM GOAL #2   Title Patient will demo improved L quad and HS strength to >4/5 MMT in order to improve performance with outdoor ambulation and transfers.   Time 7   Period Weeks   Status New   PT LONG TERM GOAL #3   Title TUG time will improve to <14 seconds with the use of a SPC in order to reduce the risk for falls.     Time 7   Period Weeks   Status New   PT LONG TERM GOAL #4   Title Patient will independently ambulate with a SPC with good heel to toe sequence on level terrain for >600' in order to progress towards becoming a community ambulator.     Time 7   Period Weeks   Status New   PT LONG TERM GOAL  #5   Title Patient will improve her FOTO limitation to <50% in order to progress towards her PLOF with recreational  activities.    Time 7   Period Weeks   Status New   Additional Long Term Goals   Additional Long Term Goals Yes   PT LONG TERM GOAL #6   Title Patient will report decreased L knee pain to 0-5/10 on a VAS in order to improve quality of life and to be able to complete standing activities for >30 minutes.   Time 7   Period Weeks   Status New               Plan - 01/12/16 1039    Clinical Impression Statement Pt continues to demonstrate post surgical pain, weakness, swelling, ROM and limited mobility. She is consistently performing her HEP 3x/day without difficulty and edema management techniqes were reviewed this session with pt verbalizing understanding. This session focused on manual treatmnet, therex and education with the pt responding well to manual techniques with decrase in pain to 2/10 by the end of her session.    Pt will benefit from skilled therapeutic intervention in order to improve on the following deficits Abnormal gait;Decreased range of motion;Difficulty walking;Decreased safety awareness;Decreased endurance;Decreased activity tolerance;Pain;Impaired flexibility;Hypomobility;Decreased balance;Decreased mobility;Decreased strength;Increased edema;Postural dysfunction   Rehab Potential Good   Clinical Impairments Affecting Rehab Potential Surgical L knee pain    PT Frequency 3x / week   PT Duration Other (comment)  7 weeks    PT Treatment/Interventions ADLs/Self Care Home Management;Cryotherapy;Electrical Stimulation;Moist Heat;Therapeutic exercise;Balance training;Therapeutic activities;Functional mobility training;Stair training;Gait training;DME Instruction;Neuromuscular re-education;Patient/family education;Manual techniques;Taping;Passive range of motion   PT Next Visit Plan Next visit to focus on L knee AAROM/PROM/AROM ther ex, quad sets, SAQ, SLR into  flex/abd/ext, gait training with rollator walker, and pt education.    PT Home Exercise Plan Initial TKA HEP provided this visit including ankle pumps, quad sets, SAQ, hip abduction, and heel slides. Addition of seated knee extension stretch   Consulted and Agree with Plan of Care Patient        Problem List Patient Active Problem List   Diagnosis Date Noted  . OA (osteoarthritis) of knee 12/27/2015  . Screening for osteoporosis 02/25/2014  . 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 Allergic reaction 05/13/2013  . Hyperlipemia 04/24/2013  . Hypertension 04/24/2013    10:48 AM,01/12/2016 Elly Modena PT, DPT [facility] [place] [address] [place], [place], [address] Phone: [phone]   Fax:  [phone]  Name: [name] MRN: [id number] Date of Birth: 05/01/1941

## 2016-01-13 ENCOUNTER — Ambulatory Visit (HOSPITAL_COMMUNITY): Payer: Medicare Other | Admitting: Physical Therapy

## 2016-01-13 DIAGNOSIS — Z96652 Presence of left artificial knee joint: Secondary | ICD-10-CM

## 2016-01-13 DIAGNOSIS — R2681 Unsteadiness on feet: Secondary | ICD-10-CM

## 2016-01-13 DIAGNOSIS — M6281 Muscle weakness (generalized): Secondary | ICD-10-CM

## 2016-01-13 DIAGNOSIS — R269 Unspecified abnormalities of gait and mobility: Secondary | ICD-10-CM | POA: Diagnosis not present

## 2016-01-13 DIAGNOSIS — M25662 Stiffness of left knee, not elsewhere classified: Secondary | ICD-10-CM

## 2016-01-13 DIAGNOSIS — M25562 Pain in left knee: Secondary | ICD-10-CM | POA: Diagnosis not present

## 2016-01-13 NOTE — Therapy (Signed)
[place], [place], [address] Phone: [phone]   Fax:  [phone]  Physical Therapy Treatment  Patient Details  Name: [name] MRN: [id number] Date of Birth: 1941-01-15 Referring Provider: Dr. Yehuda Mao  Encounter Date: 01/13/2016      PT End of Session - 01/13/16 0950    Visit Number 3   Number of Visits 22   Date for PT Re-Evaluation 02/09/16   Authorization Time Period 01/10/2016 to 02/28/2016    PT Start Time 0935   PT Stop Time 1015   PT Time Calculation (min) 40 min   Activity Tolerance Patient tolerated treatment well   Behavior During Therapy [facility] for tasks assessed/performed      Past Medical History  Diagnosis Date  . Hypertension   . Arthritis   . Allergy   . Hyperlipidemia   . Angioedema   . Hx of echocardiogram 10/2005    normal  . GERD (gastroesophageal reflux disease)   . Headache   . Thyroid disease     treated with radiation in 1980s     Past Surgical History  Procedure Laterality Date  . Abdominal hysterectomy    . Joint replacement    . Tubal ligation    . Colonoscopy  06/15/2011    Procedure: COLONOSCOPY;  Surgeon: Rogene Houston, MD;  [facility];  Service: Endoscopy;  Laterality: N/A;  . Replacement total knee Right 2000  . Cholecystectomy    . Eye surgery      removed cyst from right eye in june 2015. sept 9th surgery for eye lid drop on both eyes  . 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  Visit Diagnosis:  Status post left knee replacement  Left knee pain  Stiffness of left knee  Muscle weakness  Abnormality of gait  Unsteadiness      Subjective Assessment - 01/13/16 0936    Subjective Pt states that she has not iced since last night so her pain is up a  bit.    Pertinent History HTN, hyperlipdemia, and R TKA    Limitations Sitting;Standing;Walking   Currently in Pain? Yes   Pain Score 5    Pain Location Knee   Pain Orientation Left   Pain Descriptors / Indicators Aching;Tightness   Pain Type Surgical pain   Pain Onset 1 to 4 weeks ago   Pain Frequency Constant   Aggravating Factors  exericsies    Pain Relieving Factors rest and elevation                          [facility] Adult PT Treatment/Exercise - 01/13/16 0001    Exercises   Exercises Knee/Hip   Knee/Hip Exercises: Stretches   Active Hamstring Stretch Left;3 reps;30 seconds   Active Hamstring Stretch Limitations supine    Knee/Hip Exercises: Standing   Heel Raises --   Functional Squat --   Other Standing Knee Exercises trunk extension x 10    Knee/Hip Exercises: Seated   Long Arc Quad Strengthening;Left;10 reps   Heel Slides 10 reps   Knee/Hip Exercises: Supine   Quad Sets Strengthening;Left;10 reps   Short Arc Quad Sets Strengthening;Left;10 reps   Heel Slides Strengthening;Left;10 reps   Bridges --  Straight Leg Raises 10 reps   Patellar Mobs done   Knee Extension PROM   Knee Extension Limitations 14   Knee Flexion PROM   Knee Flexion Limitations 80   Manual Therapy   Manual Therapy Edema management;Joint mobilization;Soft tissue mobilization   Manual therapy comments Performed separatelty from all other interventions   Edema Management decongestion techniques used from groin to foot.    Joint Mobilization patella                 PT Education - 01/13/16 0950    Education provided Yes   Education Details the importance of posture to prevent artthritis.    Person(s) Educated Patient   Methods Explanation   Comprehension Verbalized understanding;Returned demonstration          PT Short Term Goals - 01/13/16 0958    PT SHORT TERM GOAL #1   Title Patient will independently verbalzie and demo initial TKA HEP in order to improve  compliance and progression towards goals.    Time 3   Period Weeks   Status On-going   PT SHORT TERM GOAL #2   Title Patient will independently verbalize pain management strategies including use of cryotherapy in order to manage pain levels with ADLs and functional mobility activities.   Time 2   Period Weeks   Status On-going   PT SHORT TERM GOAL #3   Title Patient will report reduced max L knee pain to 5/10 on a VAS in order to improve tolerance with ther ex and indoor ambulation.   Time 4   Period Weeks   Status On-going   PT SHORT TERM GOAL #4   Title Patient will demo improved L knee AAROM to 5-100 degrees in order to improve L knee mobility and ROM with ambulation and transfers.   Time 4   Period Weeks   Status On-going   PT SHORT TERM GOAL #5   Title Patient will be able to complete all transfers independently with improved safety awareness in order to return to her PLOF and reduce the risk for falls.   Time 3   Period Weeks   Status Achieved           PT Long Term Goals - 01/13/16 0959    PT LONG TERM GOAL #1   Title Patient will demo improved L knee AAROM to 0-115 degrees in order to improve L knee mobility and ROM with stair climbing and leisure activities.     Time 7   Period Weeks   Status On-going   PT LONG TERM GOAL #2   Title Patient will demo improved L quad and HS strength to >4/5 MMT in order to improve performance with outdoor ambulation and transfers.   Time 7   Period Weeks   Status On-going   PT LONG TERM GOAL #3   Title TUG time will improve to <14 seconds with the use of a SPC in order to reduce the risk for falls.     Time 7   Period Weeks   Status On-going   PT LONG TERM GOAL #4   Title Patient will independently ambulate with a SPC with good heel to toe sequence on level terrain for >600' in order to progress towards becoming a community ambulator.     Time 7   Period Weeks   Status On-going   PT LONG TERM GOAL #5   Title Patient will  improve her FOTO limitation to <50% in order to progress  towards her PLOF with recreational activities.    Time 7   Period Weeks   Status On-going   PT LONG TERM GOAL #6   Title Patient will report decreased L knee pain to 0-5/10 on a VAS in order to improve quality of life and to be able to complete standing activities for >30 minutes.   Time 7   Period Weeks   Status On-going               Plan - 01/13/16 0954    Clinical Impression Statement Pt continues to have significant lack of motion added gentle PROM to program as well as sitting heelslides, LAQ and supine hamstring stretches.   Manual techniques used to decrease edema    PT Next Visit Plan Gt train with cane; continue to focus on ROM especially extension        Problem List Patient Active Problem List   Diagnosis Date Noted  . OA (osteoarthritis) of knee 12/27/2015  . Screening for osteoporosis 02/25/2014  . 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 Allergic reaction 05/13/2013  . Hyperlipemia 04/24/2013  . Hypertension 04/24/2013     Rayetta Humphrey, PT CLT [phone] 01/13/2016, 10:17 AM  [place] [address] [place], [place], [address] Phone: [phone]   Fax:  [phone]  Name: [name] MRN: [id number] Date of Birth: August 22, 1941

## 2016-01-17 ENCOUNTER — Encounter (HOSPITAL_COMMUNITY): Payer: Medicare Other

## 2016-01-18 ENCOUNTER — Ambulatory Visit (HOSPITAL_COMMUNITY): Payer: Medicare Other | Admitting: Physical Therapy

## 2016-01-18 DIAGNOSIS — M25662 Stiffness of left knee, not elsewhere classified: Secondary | ICD-10-CM | POA: Diagnosis not present

## 2016-01-18 DIAGNOSIS — M6281 Muscle weakness (generalized): Secondary | ICD-10-CM

## 2016-01-18 DIAGNOSIS — R2681 Unsteadiness on feet: Secondary | ICD-10-CM | POA: Diagnosis not present

## 2016-01-18 DIAGNOSIS — M25562 Pain in left knee: Secondary | ICD-10-CM

## 2016-01-18 DIAGNOSIS — R269 Unspecified abnormalities of gait and mobility: Secondary | ICD-10-CM | POA: Diagnosis not present

## 2016-01-18 DIAGNOSIS — Z96652 Presence of left artificial knee joint: Secondary | ICD-10-CM | POA: Diagnosis not present

## 2016-01-18 NOTE — Therapy (Signed)
[place], [place], [address] Phone: [phone]   Fax:  [phone]  Physical Therapy Treatment  Patient Details  Name: [name] MRN: [id number] Date of Birth: 09/27/1941 Referring Provider: Dr. Yehuda Mao  Encounter Date: 01/18/2016      PT End of Session - 01/18/16 1601    Visit Number 4   Number of Visits 22   Date for PT Re-Evaluation 02/09/16   Authorization Type [employer] Medicare    Authorization Time Period 01/10/2016 to 02/28/2016    PT Start Time 1515   PT Stop Time 1558   PT Time Calculation (min) 43 min   Equipment Utilized During Treatment Gait belt   Activity Tolerance Patient tolerated treatment well   Behavior During Therapy [facility] for tasks assessed/performed      Past Medical History  Diagnosis Date  . Hypertension   . Arthritis   . Allergy   . Hyperlipidemia   . Angioedema   . Hx of echocardiogram 10/2005    normal  . GERD (gastroesophageal reflux disease)   . Headache   . Thyroid disease     treated with radiation in 1980s     Past Surgical History  Procedure Laterality Date  . Abdominal hysterectomy    . Joint replacement    . Tubal ligation    . Colonoscopy  06/15/2011    Procedure: COLONOSCOPY;  Surgeon: Rogene Houston, MD;  [facility];  Service: Endoscopy;  Laterality: N/A;  . Replacement total knee Right 2000  . Cholecystectomy    . Eye surgery      removed cyst from right eye in june 2015. sept 9th surgery for eye lid drop on both eyes  . 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  Visit Diagnosis:  Status post left knee replacement  Left knee pain  Stiffness of left knee  Muscle weakness  Abnormality of gait  Unsteadiness      Subjective Assessment - 01/18/16 1520     Subjective Pt states she is doing good today. No pain in her knee, but notes her neck is sore from sleeping on it wrong last night.    Patient is accompained by: Family member   Pertinent History HTN, hyperlipdemia, and R TKA    Currently in Pain? No/denies                         [facility] Adult PT Treatment/Exercise - 01/18/16 0001    Ambulation/Gait   Ambulation/Gait Yes   Ambulation/Gait Assistance 5: Supervision   Ambulation Distance (Feet) 226 Feet   Assistive device Straight cane   Gait Pattern Left flexed knee in stance;Left circumduction;Decreased dorsiflexion - left  verbal cues to correct technique   Ambulation Surface Level   Knee/Hip Exercises: Aerobic   Nustep L1 x4 min   Knee/Hip Exercises: Standing   Heel Raises Both;15 reps  toe raises   Heel Raises Limitations limited DF noted during toe raises   Hip Flexion Both;2 sets;10 reps  increased difficulty with L flexion   Other Standing Knee Exercises Retrostepping 2x20 reps in // bars with BUE support   Knee/Hip Exercises: Supine   Heel Slides Strengthening;Left;10 reps   Heel Slides Limitations 5 sec hold with strap  Manual Therapy   Manual Therapy Joint mobilization   Manual therapy comments Performed separatelty from all other interventions   Joint Mobilization Grade I-III PA tibiofemoral jt mobs; fibular mobs                PT Education - 01/18/16 1600    Education provided Yes   Education Details Trialed use of SPC with ambulation, reviewing proper gait sequencing; Discussed importance of applying ice 3-4x/day for 15-20 min with LE elevated to manage pain and swelling    Person(s) Educated Patient   Methods Explanation;Demonstration   Comprehension Verbalized understanding;Returned demonstration          PT Short Term Goals - 01/13/16 0958    PT SHORT TERM GOAL #1   Title Patient will independently verbalzie and demo initial TKA HEP in order to improve compliance and  progression towards goals.    Time 3   Period Weeks   Status On-going   PT SHORT TERM GOAL #2   Title Patient will independently verbalize pain management strategies including use of cryotherapy in order to manage pain levels with ADLs and functional mobility activities.   Time 2   Period Weeks   Status On-going   PT SHORT TERM GOAL #3   Title Patient will report reduced max L knee pain to 5/10 on a VAS in order to improve tolerance with ther ex and indoor ambulation.   Time 4   Period Weeks   Status On-going   PT SHORT TERM GOAL #4   Title Patient will demo improved L knee AAROM to 5-100 degrees in order to improve L knee mobility and ROM with ambulation and transfers.   Time 4   Period Weeks   Status On-going   PT SHORT TERM GOAL #5   Title Patient will be able to complete all transfers independently with improved safety awareness in order to return to her PLOF and reduce the risk for falls.   Time 3   Period Weeks   Status Achieved           PT Long Term Goals - 01/13/16 0959    PT LONG TERM GOAL #1   Title Patient will demo improved L knee AAROM to 0-115 degrees in order to improve L knee mobility and ROM with stair climbing and leisure activities.     Time 7   Period Weeks   Status On-going   PT LONG TERM GOAL #2   Title Patient will demo improved L quad and HS strength to >4/5 MMT in order to improve performance with outdoor ambulation and transfers.   Time 7   Period Weeks   Status On-going   PT LONG TERM GOAL #3   Title TUG time will improve to <14 seconds with the use of a SPC in order to reduce the risk for falls.     Time 7   Period Weeks   Status On-going   PT LONG TERM GOAL #4   Title Patient will independently ambulate with a SPC with good heel to toe sequence on level terrain for >600' in order to progress towards becoming a community ambulator.     Time 7   Period Weeks   Status On-going   PT LONG TERM GOAL #5   Title Patient will improve her FOTO  limitation to <50% in order to progress towards her PLOF with recreational activities.    Time 7   Period Weeks   Status On-going   PT LONG  TERM GOAL #6   Title Patient will report decreased L knee pain to 0-5/10 on a VAS in order to improve quality of life and to be able to complete standing activities for >30 minutes.   Time 7   Period Weeks   Status On-going               Plan - 01/18/16 1602    Clinical Impression Statement Pt continues to demonstrate significant limitations in knee ROM, specifically knee extension. Session focused on manual treatment and therapeutic activity to improve ROM for more functional gait pattern. She demonstrates decreased wt shift over her Lt LE as well as circumduction secondary to decreased hip/DF strength. Will continue current POC with manual treatment, LE therex, and gait training to improve her mobility.   Pt will benefit from skilled therapeutic intervention in order to improve on the following deficits Abnormal gait;Decreased range of motion;Difficulty walking;Decreased safety awareness;Decreased endurance;Decreased activity tolerance;Pain;Impaired flexibility;Hypomobility;Decreased balance;Decreased mobility;Decreased strength;Increased edema;Postural dysfunction   Rehab Potential Good   Clinical Impairments Affecting Rehab Potential Surgical L knee pain    PT Frequency 3x / week   PT Duration Other (comment)  7 weeks    PT Treatment/Interventions ADLs/Self Care Home Management;Cryotherapy;Electrical Stimulation;Moist Heat;Therapeutic exercise;Balance training;Therapeutic activities;Functional mobility training;Stair training;Gait training;DME Instruction;Neuromuscular re-education;Patient/family education;Manual techniques;Taping;Passive range of motion   PT Next Visit Plan LE therex targeting quads, hamstring, DF and hip flexors; manual treatment for improved knee extension; gait training.    PT Home Exercise Plan Initial TKA HEP provided this  visit including ankle pumps, quad sets, SAQ, hip abduction, and heel slides. Addition of seated knee extension stretch   Consulted and Agree with Plan of Care Patient        Problem List Patient Active Problem List   Diagnosis Date Noted  . OA (osteoarthritis) of knee 12/27/2015  . Screening for osteoporosis 02/25/2014  . 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 Allergic reaction 05/13/2013  . Hyperlipemia 04/24/2013  . Hypertension 04/24/2013    5:25 PM,01/18/2016 Elly Modena PT, DPT [facility] [place] [address] [place], [place], [address] Phone: [phone]   Fax:  [phone]  Name: MILIRA HELMAN MRN: [id number] Date of Birth: 1941/01/27

## 2016-01-19 ENCOUNTER — Ambulatory Visit (HOSPITAL_COMMUNITY): Payer: Medicare Other

## 2016-01-19 DIAGNOSIS — R269 Unspecified abnormalities of gait and mobility: Secondary | ICD-10-CM | POA: Diagnosis not present

## 2016-01-19 DIAGNOSIS — R2681 Unsteadiness on feet: Secondary | ICD-10-CM

## 2016-01-19 DIAGNOSIS — Z96652 Presence of left artificial knee joint: Secondary | ICD-10-CM

## 2016-01-19 DIAGNOSIS — M6281 Muscle weakness (generalized): Secondary | ICD-10-CM

## 2016-01-19 DIAGNOSIS — M25562 Pain in left knee: Secondary | ICD-10-CM

## 2016-01-19 DIAGNOSIS — M25662 Stiffness of left knee, not elsewhere classified: Secondary | ICD-10-CM | POA: Diagnosis not present

## 2016-01-19 NOTE — Therapy (Signed)
[place], [place], [address] Phone: [phone]   Fax:  [phone]  Physical Therapy Treatment  Patient Details  Name: [name] MRN: [id number] Date of Birth: 08-29-1941 Referring Provider: Dr Hector Shade  Encounter Date: 01/19/2016      PT End of Session - 01/19/16 1041    Visit Number 5   Number of Visits 22   Date for PT Re-Evaluation 02/09/16   Authorization Type [employer] Medicare    Authorization Time Period 01/10/2016 to 02/28/2016    PT Start Time 1018   PT Stop Time 1102   PT Time Calculation (min) 44 min   Activity Tolerance Patient tolerated treatment well   Behavior During Therapy [facility] for tasks assessed/performed      Past Medical History  Diagnosis Date  . Hypertension   . Arthritis   . Allergy   . Hyperlipidemia   . Angioedema   . Hx of echocardiogram 10/2005    normal  . GERD (gastroesophageal reflux disease)   . Headache   . Thyroid disease     treated with radiation in 1980s     Past Surgical History  Procedure Laterality Date  . Abdominal hysterectomy    . Joint replacement    . Tubal ligation    . Colonoscopy  06/15/2011    Procedure: COLONOSCOPY;  Surgeon: Rogene Houston, MD;  [facility];  Service: Endoscopy;  Laterality: N/A;  . Replacement total knee Right 2000  . Cholecystectomy    . Eye surgery      removed cyst from right eye in june 2015. sept 9th surgery for eye lid drop on both eyes  . 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  Visit Diagnosis:  Status post left knee replacement  Left knee pain  Stiffness of left knee  Muscle weakness  Abnormality of gait  Unsteadiness      Subjective Assessment - 01/19/16 1024    Subjective Pt stated knee was very achey last  night, had a rough night.  Current pain scale 5/10 c/o stiffness today.   Pertinent History HTN, hyperlipdemia, and R TKA    Patient Stated Goals Pt's goal is to ambulate without an AD, reduce her pain, and return to recereational activities ( Bocce, pickle ball, and horseshoes)    Currently in Pain? Yes   Pain Score 5    Pain Location Knee   Pain Orientation Left   Pain Descriptors / Indicators Aching;Tightness  stiffness   Pain Type Surgical pain   Pain Radiating Towards no radiclar symptoms   Pain Onset 1 to 4 weeks ago   Pain Frequency Constant   Aggravating Factors  exericsies   Pain Relieving Factors rest and elevation   Effect of Pain on Daily Activities Difficulty with long distance ambulation indoors/outdoors, difficulty with dressing lower body, and difficulty with transfers            [facility] PT Assessment - 01/19/16 0001    Assessment   Medical Diagnosis s/p L TKA    Referring Provider Dr Hector Shade   Onset Date/Surgical Date 12/27/15   Hand Dominance Right   Next MD Visit 02/01/2016   Prior Therapy [facility] PT ( 3 visits)    Precautions   Precautions Fall  [facility] Adult PT Treatment/Exercise - 01/19/16 0001    Ambulation/Gait   Ambulation/Gait Yes   Ambulation/Gait Assistance 5: Supervision   Ambulation Distance (Feet) 226 Feet   Assistive device Straight cane   Gait Pattern Left flexed knee in stance;Left circumduction;Decreased dorsiflexion - left   Ambulation Surface Level   Knee/Hip Exercises: Stretches   Active Hamstring Stretch Left;3 reps;30 seconds   Active Hamstring Stretch Limitations supine    Knee: Self-Stretch to increase Flexion Left;10 seconds  knee drives on 8in box [id number] 10" holds   Gastroc Stretch 3 reps;30 seconds   Gastroc Stretch Limitations slant board   Knee/Hip Exercises: Aerobic   Recumbent Bike 5' rocking seat 17   Knee/Hip Exercises: Supine   Quad Sets 2 sets;10 reps   Short Arc Quad Sets 15 reps   Heel  Slides Strengthening;Left;10 reps   Heel Slides Limitations 5 sec hold with strap  AROM 13-82 degrees   Patellar Mobs complete   Knee Extension PROM   Knee Extension Limitations 13   Knee Flexion PROM   Knee Flexion Limitations 83                PT Education - 01/19/16 1127    Education provided Yes   Education Details Conservator, museum/gallery with SPC; reviewed goals and complaince with HEP, copy of eval given   Person(s) Educated Patient   Methods Explanation;Demonstration;Handout   Comprehension Verbalized understanding;Returned demonstration          PT Short Term Goals - 01/13/16 0958    PT SHORT TERM GOAL #1   Title Patient will independently verbalzie and demo initial TKA HEP in order to improve compliance and progression towards goals.    Time 3   Period Weeks   Status On-going   PT SHORT TERM GOAL #2   Title Patient will independently verbalize pain management strategies including use of cryotherapy in order to manage pain levels with ADLs and functional mobility activities.   Time 2   Period Weeks   Status On-going   PT SHORT TERM GOAL #3   Title Patient will report reduced max L knee pain to 5/10 on a VAS in order to improve tolerance with ther ex and indoor ambulation.   Time 4   Period Weeks   Status On-going   PT SHORT TERM GOAL #4   Title Patient will demo improved L knee AAROM to 5-100 degrees in order to improve L knee mobility and ROM with ambulation and transfers.   Time 4   Period Weeks   Status On-going   PT SHORT TERM GOAL #5   Title Patient will be able to complete all transfers independently with improved safety awareness in order to return to her PLOF and reduce the risk for falls.   Time 3   Period Weeks   Status Achieved           PT Long Term Goals - 01/13/16 0959    PT LONG TERM GOAL #1   Title Patient will demo improved L knee AAROM to 0-115 degrees in order to improve L knee mobility and ROM with stair climbing and leisure  activities.     Time 7   Period Weeks   Status On-going   PT LONG TERM GOAL #2   Title Patient will demo improved L quad and HS strength to >4/5 MMT in order to improve performance with outdoor ambulation and transfers.   Time 7   Period Weeks   Status On-going  PT LONG TERM GOAL #3   Title TUG time will improve to <14 seconds with the use of a SPC in order to reduce the risk for falls.     Time 7   Period Weeks   Status On-going   PT LONG TERM GOAL #4   Title Patient will independently ambulate with a SPC with good heel to toe sequence on level terrain for >600' in order to progress towards becoming a community ambulator.     Time 7   Period Weeks   Status On-going   PT LONG TERM GOAL #5   Title Patient will improve her FOTO limitation to <50% in order to progress towards her PLOF with recreational activities.    Time 7   Period Weeks   Status On-going   PT LONG TERM GOAL #6   Title Patient will report decreased L knee pain to 0-5/10 on a VAS in order to improve quality of life and to be able to complete standing activities for >30 minutes.   Time 7   Period Weeks   Status On-going               Plan - 01/19/16 1112    Clinical Impression Statement Pt continues to demonstrate significant impairements with knee ROM.  Session focus on improving knee mobilty with ROM based exercises, stretches and manual technqiues.  Began session with recumbent bike (rocking only) and added knee drives to improve flexibility initialy this session.  PROM complete within pt. tolerance to assist with range.  Reviewed proper gait mechanics with SPC, pt demonstrates decreased stance phase and increased cueing for heel to toe with knee flexion at toe off to reduce circumduction with gait.  Ended session with manual technique to assist wtih edema control and pain.  Encouraged pt to apply ice for pain and edema control athome. Reviewed goals, compliance with HEP and copy of eval given to pt.     Pt  will benefit from skilled therapeutic intervention in order to improve on the following deficits Abnormal gait;Decreased range of motion;Difficulty walking;Decreased safety awareness;Decreased endurance;Decreased activity tolerance;Pain;Impaired flexibility;Hypomobility;Decreased balance;Decreased mobility;Decreased strength;Increased edema;Postural dysfunction   Rehab Potential Good   Clinical Impairments Affecting Rehab Potential Surgical L knee pain    PT Frequency 3x / week   PT Duration --  7 weeks   PT Treatment/Interventions ADLs/Self Care Home Management;Cryotherapy;Electrical Stimulation;Moist Heat;Therapeutic exercise;Balance training;Therapeutic activities;Functional mobility training;Stair training;Gait training;DME Instruction;Neuromuscular re-education;Patient/family education;Manual techniques;Taping;Passive range of motion   PT Next Visit Plan Continue focus on ROM, gait training with SPC, manual tx for improved knee extension and edema control.        Problem List Patient Active Problem List   Diagnosis Date Noted  . OA (osteoarthritis) of knee 12/27/2015  . Screening for osteoporosis 02/25/2014  . 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 Allergic reaction 05/13/2013  . Hyperlipemia 04/24/2013  . Hypertension 04/24/2013  Ihor Austin, [place]; [place]   Aldona Lento 01/19/2016, 11:33 AM  [place], [place], [address] Phone: [phone]   Fax:  [phone]  Name: [name] MRN: [id number] Date of Birth: 1941-09-17

## 2016-01-20 ENCOUNTER — Ambulatory Visit (HOSPITAL_COMMUNITY): Payer: Medicare Other | Admitting: Physical Therapy

## 2016-01-20 DIAGNOSIS — Z96652 Presence of left artificial knee joint: Secondary | ICD-10-CM

## 2016-01-20 DIAGNOSIS — M25662 Stiffness of left knee, not elsewhere classified: Secondary | ICD-10-CM | POA: Diagnosis not present

## 2016-01-20 DIAGNOSIS — R269 Unspecified abnormalities of gait and mobility: Secondary | ICD-10-CM

## 2016-01-20 DIAGNOSIS — M6281 Muscle weakness (generalized): Secondary | ICD-10-CM | POA: Diagnosis not present

## 2016-01-20 DIAGNOSIS — M25562 Pain in left knee: Secondary | ICD-10-CM | POA: Diagnosis not present

## 2016-01-20 DIAGNOSIS — R2681 Unsteadiness on feet: Secondary | ICD-10-CM

## 2016-01-20 NOTE — Therapy (Signed)
[place], [place], [address] Phone: [phone]   Fax:  [phone]  Physical Therapy Treatment  Patient Details  Name: [name] MRN: [id number] Date of Birth: 05-29-1941 Referring Provider: Dr Hector Shade  Encounter Date: 01/20/2016      PT End of Session - 01/20/16 1402    Visit Number 6   Number of Visits 22   Date for PT Re-Evaluation 02/09/16   Authorization Type [employer]HC Medicare    Authorization Time Period 01/10/2016 to 02/28/2016    PT Start Time 1302   PT Stop Time 1345   PT Time Calculation (min) 43 min   Activity Tolerance Patient tolerated treatment well   Behavior During Therapy [facility] for tasks assessed/performed      Past Medical History  Diagnosis Date  . Hypertension   . Arthritis   . Allergy   . Hyperlipidemia   . Angioedema   . Hx of echocardiogram 10/2005    normal  . GERD (gastroesophageal reflux disease)   . Headache   . Thyroid disease     treated with radiation in 1980s     Past Surgical History  Procedure Laterality Date  . Abdominal hysterectomy    . Joint replacement    . Tubal ligation    . Colonoscopy  06/15/2011    Procedure: COLONOSCOPY;  Surgeon: Rogene Houston, MD;  [facility];  Service: Endoscopy;  Laterality: N/A;  . Replacement total knee Right 2000  . Cholecystectomy    . Eye surgery      removed cyst from right eye in june 2015. sept 9th surgery for eye lid drop on both eyes  . 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  Visit Diagnosis:  Left knee pain  Status post left knee replacement  Stiffness of left knee  Muscle weakness  Abnormality of gait  Unsteadiness      Subjective Assessment - 01/20/16 1353    Subjective Pt states she has been experiencing  some increased pain after her last 2 sessions, noting it is more muscle soreness than anything. She is icing her knee atleast 3x/day and performing her HEP daily.    Pertinent History HTN, hyperlipdemia, and R TKA    Patient Stated Goals Pt's goal is to ambulate without an AD, reduce her pain, and return to recereational activities ( Bocce, pickle ball, and horseshoes)    Currently in Pain? No/denies  stiffness   Pain Onset --                         [facility] Adult PT Treatment/Exercise - 01/20/16 0001    Knee/Hip Exercises: Aerobic   Recumbent Bike 5' rocking seat 17   Knee/Hip Exercises: Standing   Other Standing Knee Exercises Retrostepping 2x20 reps in // bars with BUE support   Knee/Hip Exercises: Seated   Long Arc Quad Strengthening;Left;10 reps   Long Arc Quad Limitations --   Hamstring Curl --   Hamstring Limitations --   Knee/Hip Exercises: Supine   Short Arc Quad Sets 15 reps;Left  with ice pack applied to L knee   Patellar Mobs complete   Knee Extension PROM   Knee Extension Limitations 15   Knee Flexion PROM   Knee  Flexion Limitations 85   Knee/Hip Exercises: Prone   Hamstring Curl 2 sets;5 seconds;10 reps   Modalities   Modalities --   Manual Therapy   Manual Therapy Joint mobilization   Manual therapy comments Performed separatelty from all other interventions   Joint Mobilization Grade I-III PA tibiofemoral jt mobs; fibular mobs; patellar mobs S<>I                PT Education - 01/20/16 1402    Education provided Yes   Education Details Cotinuing to apply ice 3-4x/day for no longer tahn 20 min with LE elevation for edema management   Person(s) Educated Patient   Methods Explanation   Comprehension Verbalized understanding          PT Short Term Goals - 01/13/16 0958    PT SHORT TERM GOAL #1   Title Patient will independently verbalzie and demo initial TKA HEP in order to improve compliance and progression towards goals.    Time 3    Period Weeks   Status On-going   PT SHORT TERM GOAL #2   Title Patient will independently verbalize pain management strategies including use of cryotherapy in order to manage pain levels with ADLs and functional mobility activities.   Time 2   Period Weeks   Status On-going   PT SHORT TERM GOAL #3   Title Patient will report reduced max L knee pain to 5/10 on a VAS in order to improve tolerance with ther ex and indoor ambulation.   Time 4   Period Weeks   Status On-going   PT SHORT TERM GOAL #4   Title Patient will demo improved L knee AAROM to 5-100 degrees in order to improve L knee mobility and ROM with ambulation and transfers.   Time 4   Period Weeks   Status On-going   PT SHORT TERM GOAL #5   Title Patient will be able to complete all transfers independently with improved safety awareness in order to return to her PLOF and reduce the risk for falls.   Time 3   Period Weeks   Status Achieved           PT Long Term Goals - 01/13/16 0959    PT LONG TERM GOAL #1   Title Patient will demo improved L knee AAROM to 0-115 degrees in order to improve L knee mobility and ROM with stair climbing and leisure activities.     Time 7   Period Weeks   Status On-going   PT LONG TERM GOAL #2   Title Patient will demo improved L quad and HS strength to >4/5 MMT in order to improve performance with outdoor ambulation and transfers.   Time 7   Period Weeks   Status On-going   PT LONG TERM GOAL #3   Title TUG time will improve to <14 seconds with the use of a SPC in order to reduce the risk for falls.     Time 7   Period Weeks   Status On-going   PT LONG TERM GOAL #4   Title Patient will independently ambulate with a SPC with good heel to toe sequence on level terrain for >600' in order to progress towards becoming a community ambulator.     Time 7   Period Weeks   Status On-going   PT LONG TERM GOAL #5   Title Patient will improve her FOTO limitation to <50% in order to progress  towards her PLOF with recreational activities.  Time 7   Period Weeks   Status On-going   PT LONG TERM GOAL #6   Title Patient will report decreased L knee pain to 0-5/10 on a VAS in order to improve quality of life and to be able to complete standing activities for >30 minutes.   Time 7   Period Weeks   Status On-going               Plan - 01/20/16 1403    Clinical Impression Statement Pt continues to demonstrate LE pain and impairments in strength and ROM which is limiting her ability to fully perform ADLs and walk without her AD. Today's session focusing on improving knee ROM with jt mobilization techniques as well as LE therex to improve quad/hamstring strength. Pt reporting no increase in pain at the end of today's session. Will continue with current POC.   Pt will benefit from skilled therapeutic intervention in order to improve on the following deficits Abnormal gait;Decreased range of motion;Difficulty walking;Decreased safety awareness;Decreased endurance;Decreased activity tolerance;Pain;Impaired flexibility;Hypomobility;Decreased balance;Decreased mobility;Decreased strength;Increased edema;Postural dysfunction   Rehab Potential Good   Clinical Impairments Affecting Rehab Potential Surgical L knee pain    PT Frequency 3x / week   PT Duration --  7 weeks   PT Treatment/Interventions ADLs/Self Care Home Management;Cryotherapy;Electrical Stimulation;Moist Heat;Therapeutic exercise;Balance training;Therapeutic activities;Functional mobility training;Stair training;Gait training;DME Instruction;Neuromuscular re-education;Patient/family education;Manual techniques;Taping;Passive range of motion   PT Next Visit Plan Continue focus on ROM, gait training with SPC, manual tx for improved knee extension/flexion and edema control; addition of hip strengthening therex   PT Home Exercise Plan No changes made this session   Consulted and Agree with Plan of Care Patient         Problem List Patient Active Problem List   Diagnosis Date Noted  . OA (osteoarthritis) of knee 12/27/2015  . Screening for osteoporosis 02/25/2014  . 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 Allergic reaction 05/13/2013  . Hyperlipemia 04/24/2013  . Hypertension 04/24/2013    2:14 PM,01/20/2016 Elly Modena PT, DPT [facility] [place] [address] [place], [place], [address] Phone: [phone]   Fax:  [phone]  Name: [name] MRN: [id number] Date of Birth: 03/08/1941

## 2016-01-24 ENCOUNTER — Ambulatory Visit (HOSPITAL_COMMUNITY): Payer: Medicare Other | Attending: Orthopedic Surgery

## 2016-01-24 DIAGNOSIS — R269 Unspecified abnormalities of gait and mobility: Secondary | ICD-10-CM | POA: Diagnosis not present

## 2016-01-24 DIAGNOSIS — Z96652 Presence of left artificial knee joint: Secondary | ICD-10-CM | POA: Insufficient documentation

## 2016-01-24 DIAGNOSIS — M25662 Stiffness of left knee, not elsewhere classified: Secondary | ICD-10-CM | POA: Insufficient documentation

## 2016-01-24 DIAGNOSIS — R2689 Other abnormalities of gait and mobility: Secondary | ICD-10-CM | POA: Insufficient documentation

## 2016-01-24 DIAGNOSIS — R2681 Unsteadiness on feet: Secondary | ICD-10-CM | POA: Insufficient documentation

## 2016-01-24 DIAGNOSIS — M25562 Pain in left knee: Secondary | ICD-10-CM | POA: Diagnosis not present

## 2016-01-24 DIAGNOSIS — M6281 Muscle weakness (generalized): Secondary | ICD-10-CM | POA: Diagnosis not present

## 2016-01-24 NOTE — Therapy (Signed)
[place], [place], [address] Phone: [phone]   Fax:  [phone]  Physical Therapy Treatment  Patient Details  Name: [name] MRN: [id number] Date of Birth: [birth year] Referring Provider: Dr Hector Shade  Encounter Date: 01/24/2016      PT End of Session - 01/24/16 0859    Visit Number 7   Number of Visits 22   Date for PT Re-Evaluation 02/09/16   Authorization Type [employer] Medicare    Authorization Time Period 01/10/2016 to 02/28/2016    PT Start Time 0846   PT Stop Time 0930   PT Time Calculation (min) 44 min   Activity Tolerance Patient tolerated treatment well   Behavior During Therapy [facility] for tasks assessed/performed      Past Medical History  Diagnosis Date  . Hypertension   . Arthritis   . Allergy   . Hyperlipidemia   . Angioedema   . Hx of echocardiogram 10/2005    normal  . GERD (gastroesophageal reflux disease)   . Headache   . Thyroid disease     treated with radiation in 1980s     Past Surgical History  Procedure Laterality Date  . Abdominal hysterectomy    . Joint replacement    . Tubal ligation    . Colonoscopy  06/15/2011    Procedure: COLONOSCOPY;  Surgeon: Rogene Houston, MD;  [facility];  Service: Endoscopy;  Laterality: N/A;  . Replacement total knee Right 2000  . Cholecystectomy    . Eye surgery      removed cyst from right eye in june 2015. sept 9th surgery for eye lid drop on both eyes  . 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  Visit Diagnosis:  Left knee pain  Status post left knee replacement  Stiffness of left knee  Muscle weakness  Abnormality of gait  Unsteadiness      Subjective Assessment - 01/24/16 0850    Subjective Pt noted that her L knee "is doing  alright" with pain rated a 2/10 on a VAS upon arrival. Pt reports that her L knee stiffness is "getting better" and is less severe since last week. Pt denied falls since last PT visit and further noted that walking with a SPC is also improving.   Patient is accompained by: Family member   Pertinent History HTN, hyperlipdemia, and R TKA    Limitations Sitting;Standing;Walking   How long can you stand comfortably? 10-15 minutes    Diagnostic tests N/A    Patient Stated Goals Pt's goal is to ambulate without an AD, reduce her pain, and return to recereational activities ( Bocce, pickle ball, and horseshoes)    Currently in Pain? Yes   Pain Score 2   L knee pain has ranged between a 0-7/10 on a VAS since last PT visit    Pain Location Knee   Pain Orientation Left;Lateral   Pain Descriptors / Indicators Aching;Dull   Pain Type Surgical pain   Pain Onset 1 to 4 weeks ago   Pain Frequency Intermittent   Aggravating Factors  Max L knee flexion    Pain Relieving Factors rest, pain meds, and elevation    Effect of Pain on Daily Activities Difficulty with long distance ambulation and prolonged standing with  ADLs/ cooking    Multiple Pain Sites No                         [facility] Adult PT Treatment/Exercise - 01/24/16 0001    Knee/Hip Exercises: Stretches   Active Hamstring Stretch Left;3 reps;30 seconds   Active Hamstring Stretch Limitations supine    Knee: Self-Stretch to increase Flexion Left;10 seconds   Knee/Hip Exercises: Standing   Heel Raises Both;20 reps   Hip Flexion Both;2 sets;10 reps   Functional Squat 1 set;10 reps;Other (comment)  with UE support    Other Standing Knee Exercises Retrostepping and side steps, 2x20 reps in // bars with BUE support   Knee/Hip Exercises: Supine   Quad Sets 2 sets;10 reps;Left   Quad Sets Limitations 5 sec hold    Short Arc Quad Sets Strengthening;Left;2 sets;10 reps   Short Arc Quad Sets Limitations 5 sec hold    Heel Slides  AAROM;AROM;Left;2 sets;10 reps   Heel Slides Limitations 1 set completed with strap   Straight Leg Raises Strengthening;Left;2 sets;10 reps   Straight Leg Raises Limitations verbal cues to maintain L TKE    Knee Extension AAROM   Knee Extension Limitations 10 degrees    Knee Flexion AAROM   Knee Flexion Limitations 95 degrees    Manual Therapy   Manual Therapy Joint mobilization;Passive ROM   Manual therapy comments Performed separatelty from all other interventions   Joint Mobilization Grade I-III patellar mobs S<>I   Passive ROM L knee PROM into flexion/extension x 10 reps                 PT Education - 01/24/16 0856    Education provided Yes   Education Details current HEP, gait pattern and L TKE at initial contact, use of cryotherapy and elevation to manage pain/edema, and 5/5 fall precautions    Person(s) Educated Patient   Methods Explanation;Demonstration   Comprehension Verbalized understanding;Returned demonstration          PT Short Term Goals - 01/13/16 0958    PT SHORT TERM GOAL #1   Title Patient will independently verbalzie and demo initial TKA HEP in order to improve compliance and progression towards goals.    Time 3   Period Weeks   Status On-going   PT SHORT TERM GOAL #2   Title Patient will independently verbalize pain management strategies including use of cryotherapy in order to manage pain levels with ADLs and functional mobility activities.   Time 2   Period Weeks   Status On-going   PT SHORT TERM GOAL #3   Title Patient will report reduced max L knee pain to 5/10 on a VAS in order to improve tolerance with ther ex and indoor ambulation.   Time 4   Period Weeks   Status On-going   PT SHORT TERM GOAL #4   Title Patient will demo improved L knee AAROM to 5-100 degrees in order to improve L knee mobility and ROM with ambulation and transfers.   Time 4   Period Weeks   Status On-going   PT SHORT TERM GOAL #5   Title Patient will be able to  complete all transfers independently with improved safety awareness in order to return to her PLOF and reduce the risk for falls.   Time 3   Period Weeks   Status Achieved           PT Long Term Goals - 01/13/16 ID:[id number]  PT LONG TERM GOAL #1   Title Patient will demo improved L knee AAROM to 0-115 degrees in order to improve L knee mobility and ROM with stair climbing and leisure activities.     Time 7   Period Weeks   Status On-going   PT LONG TERM GOAL #2   Title Patient will demo improved L quad and HS strength to >4/5 MMT in order to improve performance with outdoor ambulation and transfers.   Time 7   Period Weeks   Status On-going   PT LONG TERM GOAL #3   Title TUG time will improve to <14 seconds with the use of a SPC in order to reduce the risk for falls.     Time 7   Period Weeks   Status On-going   PT LONG TERM GOAL #4   Title Patient will independently ambulate with a SPC with good heel to toe sequence on level terrain for >600' in order to progress towards becoming a community ambulator.     Time 7   Period Weeks   Status On-going   PT LONG TERM GOAL #5   Title Patient will improve her FOTO limitation to <50% in order to progress towards her PLOF with recreational activities.    Time 7   Period Weeks   Status On-going   PT LONG TERM GOAL #6   Title Patient will report decreased L knee pain to 0-5/10 on a VAS in order to improve quality of life and to be able to complete standing activities for >30 minutes.   Time 7   Period Weeks   Status On-going               Plan - 01/24/16 0900    Clinical Impression Statement PT tx was focused on L knee PROM/AAROM/AROM ther ex, quad/HS/hip strengthening, patellar mobs, and gait training with SPC. Improved L knee AAROM assessed post ROM ther ex and manual techniques, which was measured 10-95 degrees. Pt required verbal and tactile cues for proper technique with ther ex and to maintain quad activated with SLR.  Additional reps/sets added to ther ex this visit with good tolerance reported. Patellar mobility continues to be limited in S<>I direction, which slightly improved once mobs completed. Reviewed proper heel to toe gait pattern with the use of a SPC. Verbal cues required to achieve L TKE at initial contact. Improved gait pattern demo once cues provided. Pt is progressing towards stated goals with improved L knee mobility/ROM and improved gait pattern with the use of a SPC. L knee pain was rated a 2/10 on a VAS at the end of PT tx. Continue with current POC with focus on L knee ROM/mobility ther ex, quad/HS/hip strengthening, gait training with SPC, and manual therapy techniques with addition of scar mobs.    Pt will benefit from skilled therapeutic intervention in order to improve on the following deficits Abnormal gait;Decreased range of motion;Difficulty walking;Decreased safety awareness;Decreased endurance;Decreased activity tolerance;Pain;Impaired flexibility;Hypomobility;Decreased balance;Decreased mobility;Decreased strength;Increased edema;Postural dysfunction   Rehab Potential Good   Clinical Impairments Affecting Rehab Potential Surgical L knee pain    PT Frequency 3x / week   PT Duration Other (comment)  7 weeks    PT Treatment/Interventions ADLs/Self Care Home Management;Cryotherapy;Electrical Stimulation;Moist Heat;Therapeutic exercise;Balance training;Therapeutic activities;Functional mobility training;Stair training;Gait training;DME Instruction;Neuromuscular re-education;Patient/family education;Manual techniques;Taping;Passive range of motion   PT Next Visit Plan Continue focus on ROM, gait training with SPC, manual tx for improved knee extension/flexion and edema control; addition of hip  strengthening therex   PT Home Exercise Plan No changes made this session   Consulted and Agree with Plan of Care Patient        Problem List Patient Active Problem List   Diagnosis Date Noted  .  OA (osteoarthritis) of knee 12/27/2015  . Screening for osteoporosis 02/25/2014  . 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 Allergic reaction 05/13/2013  . Hyperlipemia 04/24/2013  . Hypertension 04/24/2013    Garen Lah, PT, DPT   01/24/2016, 12:13 PM  [facility] [address] [place], [place], [address] Phone: [phone]   Fax:  [phone]  Name: [name] MRN: [id number] Date of Birth: 15-Oct-1941

## 2016-01-26 ENCOUNTER — Ambulatory Visit (HOSPITAL_COMMUNITY): Payer: Medicare Other

## 2016-01-26 DIAGNOSIS — M6281 Muscle weakness (generalized): Secondary | ICD-10-CM

## 2016-01-26 DIAGNOSIS — R2681 Unsteadiness on feet: Secondary | ICD-10-CM | POA: Diagnosis not present

## 2016-01-26 DIAGNOSIS — R269 Unspecified abnormalities of gait and mobility: Secondary | ICD-10-CM

## 2016-01-26 DIAGNOSIS — M25562 Pain in left knee: Secondary | ICD-10-CM | POA: Diagnosis not present

## 2016-01-26 DIAGNOSIS — R2689 Other abnormalities of gait and mobility: Secondary | ICD-10-CM | POA: Diagnosis not present

## 2016-01-26 DIAGNOSIS — M25662 Stiffness of left knee, not elsewhere classified: Secondary | ICD-10-CM

## 2016-01-26 DIAGNOSIS — Z96652 Presence of left artificial knee joint: Secondary | ICD-10-CM

## 2016-01-26 NOTE — Therapy (Signed)
[place], [place], [address] Phone: [phone]   Fax:  [phone]  Physical Therapy Treatment  Patient Details  Name: [name] MRN: [id number] Date of Birth: 04-16-1941 Referring Provider: Dr Hector Shade  Encounter Date: 01/26/2016      PT End of Session - 01/26/16 1114    Visit Number 8   Number of Visits 22   Date for PT Re-Evaluation 02/09/16   Authorization Type [employer]HC Medicare    Authorization Time Period 01/10/2016 to 02/28/2016    PT Start Time 1106   PT Stop Time 1206   PT Time Calculation (min) 60 min   Activity Tolerance Patient tolerated treatment well   Behavior During Therapy [facility] for tasks assessed/performed      Past Medical History  Diagnosis Date  . Hypertension   . Arthritis   . Allergy   . Hyperlipidemia   . Angioedema   . Hx of echocardiogram 10/2005    normal  . GERD (gastroesophageal reflux disease)   . Headache   . Thyroid disease     treated with radiation in 1980s     Past Surgical History  Procedure Laterality Date  . Abdominal hysterectomy    . Joint replacement    . Tubal ligation    . Colonoscopy  06/15/2011    Procedure: COLONOSCOPY;  Surgeon: Rogene Houston, MD;  [facility];  Service: Endoscopy;  Laterality: N/A;  . Replacement total knee Right 2000  . Cholecystectomy    . Eye surgery      removed cyst from right eye in june 2015. sept 9th surgery for eye lid drop on both eyes  . 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  Visit Diagnosis:  Status post left knee replacement  Stiffness of left knee  Muscle weakness  Abnormality of gait  Unsteadiness  Left knee pain      Subjective Assessment - 01/26/16 1111    Subjective Pt stated she had a lot of pain  getting out of bed yesterday, current pain scale 4/10   Pertinent History HTN, hyperlipdemia, and R TKA    Patient Stated Goals Pt's goal is to ambulate without an AD, reduce her pain, and return to recereational activities ( Bocce, pickle ball, and horseshoes)    Currently in Pain? Yes   Pain Score 4    Pain Location Knee   Pain Descriptors / Indicators Sore;Aching;Tightness  stiffness           [facility] Adult PT Treatment/Exercise - 01/26/16 0001    Knee/Hip Exercises: Stretches   Active Hamstring Stretch Left;3 reps;30 seconds   Active Hamstring Stretch Limitations supine    Knee: Self-Stretch to increase Flexion Left;10 seconds   Knee: Self-Stretch Limitations 10 reps on 8 in step   Knee/Hip Exercises: Aerobic   Recumbent Bike 5' rocking then able to make 3 full revolutions seat 19   Knee/Hip Exercises: Standing   Heel Raises Both;20 reps   Functional Squat 15 reps  with UE A   Knee/Hip Exercises: Supine   Quad Sets 2 sets;10 reps;Left   Quad Sets Limitations 5 second holds   Short Arc [employer] 15 reps   Short Arc Quad Sets Limitations 5 second holds   Heel Slides AAROM;AROM;Left;2 sets;10  reps   Heel Slides Limitations 8-94   Straight Leg Raises Left;15 reps   Straight Leg Raises Limitations verbal cues to maintain L TKE    Patellar Mobs complete   Knee Extension AAROM   Knee Extension Limitations 8degrees   Knee Flexion AAROM   Knee Flexion Limitations 95 degrees    Cryotherapy   Number Minutes Cryotherapy 10 Minutes   Cryotherapy Location Knee   Type of Cryotherapy Ice pack   Manual Therapy   Manual Therapy Joint mobilization;Soft tissue mobilization;Passive ROM   Manual therapy comments Performed separatelty from all other interventions   Joint Mobilization Grade I-III patellar mobs S<>I and M<>L   Soft tissue mobilization quadriceps and ITB supine with LE elevated   Passive ROM L knee PROM into flexion/extension x 10 reps             PT Short Term Goals  - 01/13/16 [id number]    PT SHORT TERM GOAL #1   Title Patient will independently verbalzie and demo initial TKA HEP in order to improve compliance and progression towards goals.    Time 3   Period Weeks   Status On-going   PT SHORT TERM GOAL #2   Title Patient will independently verbalize pain management strategies including use of cryotherapy in order to manage pain levels with ADLs and functional mobility activities.   Time 2   Period Weeks   Status On-going   PT SHORT TERM GOAL #3   Title Patient will report reduced max L knee pain to 5/10 on a VAS in order to improve tolerance with ther ex and indoor ambulation.   Time 4   Period Weeks   Status On-going   PT SHORT TERM GOAL #4   Title Patient will demo improved L knee AAROM to 5-100 degrees in order to improve L knee mobility and ROM with ambulation and transfers.   Time 4   Period Weeks   Status On-going   PT SHORT TERM GOAL #5   Title Patient will be able to complete all transfers independently with improved safety awareness in order to return to her PLOF and reduce the risk for falls.   Time 3   Period Weeks   Status Achieved           PT Long Term Goals - 01/13/16 0959    PT LONG TERM GOAL #1   Title Patient will demo improved L knee AAROM to 0-115 degrees in order to improve L knee mobility and ROM with stair climbing and leisure activities.     Time 7   Period Weeks   Status On-going   PT LONG TERM GOAL #2   Title Patient will demo improved L quad and HS strength to >4/5 MMT in order to improve performance with outdoor ambulation and transfers.   Time 7   Period Weeks   Status On-going   PT LONG TERM GOAL #3   Title TUG time will improve to <14 seconds with the use of a SPC in order to reduce the risk for falls.     Time 7   Period Weeks   Status On-going   PT LONG TERM GOAL #4   Title Patient will independently ambulate with a SPC with good heel to toe sequence on level terrain for >600' in order to progress  towards becoming a community ambulator.     Time 7   Period Weeks   Status On-going   PT LONG TERM GOAL #5  Title Patient will improve her FOTO limitation to <50% in order to progress towards her PLOF with recreational activities.    Time 7   Period Weeks   Status On-going   PT LONG TERM GOAL #6   Title Patient will report decreased L knee pain to 0-5/10 on a VAS in order to improve quality of life and to be able to complete standing activities for >30 minutes.   Time 7   Period Weeks   Status On-going               Plan - 01/26/16 1206    Clinical Impression Statement Session focus on improving Lt knee ROM with therex including abilty to complete 3 full revolutions on recumbent bike, stretches to improve muscle lengthening, quad and hamstrings strengthening exercises and manual techniques.  PROM complete per pt. tolerance for flexion and extension.  Manual soft tissue mobilization technqiues complete on quad and ITB to reduce tightness, patella mobs complete to reduce adhesions with noted restrictions S<>I directions.  AROM improved 8-95 degrees at end of sesson.  Ice applied at end of session for pain and edema control.  No reports of increased pain through session.     Pt will benefit from skilled therapeutic intervention in order to improve on the following deficits Abnormal gait;Decreased range of motion;Difficulty walking;Decreased safety awareness;Decreased endurance;Decreased activity tolerance;Pain;Impaired flexibility;Hypomobility;Decreased balance;Decreased mobility;Decreased strength;Increased edema;Postural dysfunction   Rehab Potential Good   Clinical Impairments Affecting Rehab Potential Surgical L knee pain    PT Frequency 3x / week   PT Duration Other (comment)  7 weeks   PT Treatment/Interventions ADLs/Self Care Home Management;Cryotherapy;Electrical Stimulation;Moist Heat;Therapeutic exercise;Balance training;Therapeutic activities;Functional mobility  training;Stair training;Gait training;DME Instruction;Neuromuscular re-education;Patient/family education;Manual techniques;Taping;Passive range of motion   PT Next Visit Plan Continue focus on ROM, gait training with SPC, manual tx for improved knee extension/flexion and edema control; addition of hip strengthening therex        Problem List Patient Active Problem List   Diagnosis Date Noted  . OA (osteoarthritis) of knee 12/27/2015  . Screening for osteoporosis 02/25/2014  . 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 Allergic reaction 05/13/2013  . Hyperlipemia 04/24/2013  . Hypertension 04/24/2013   Ihor Austin, [place]; [place]  Aldona Lento 01/26/2016, 12:20 PM  [place], [place], [address] Phone: [phone]   Fax:  [phone]  Name: [name] MRN: [id number] Date of Birth: 1940-12-19

## 2016-01-28 ENCOUNTER — Ambulatory Visit: Payer: Medicare Other | Admitting: Family Medicine

## 2016-01-28 ENCOUNTER — Ambulatory Visit (HOSPITAL_COMMUNITY): Payer: Medicare Other

## 2016-01-28 DIAGNOSIS — R2689 Other abnormalities of gait and mobility: Secondary | ICD-10-CM | POA: Diagnosis not present

## 2016-01-28 DIAGNOSIS — M6281 Muscle weakness (generalized): Secondary | ICD-10-CM

## 2016-01-28 DIAGNOSIS — R2681 Unsteadiness on feet: Secondary | ICD-10-CM | POA: Diagnosis not present

## 2016-01-28 DIAGNOSIS — M25562 Pain in left knee: Secondary | ICD-10-CM | POA: Diagnosis not present

## 2016-01-28 DIAGNOSIS — M25662 Stiffness of left knee, not elsewhere classified: Secondary | ICD-10-CM

## 2016-01-28 DIAGNOSIS — Z96652 Presence of left artificial knee joint: Secondary | ICD-10-CM | POA: Diagnosis not present

## 2016-01-28 DIAGNOSIS — R269 Unspecified abnormalities of gait and mobility: Secondary | ICD-10-CM | POA: Diagnosis not present

## 2016-01-28 NOTE — Therapy (Signed)
[place], [place], [address] Phone: [phone]   Fax:  [phone]  Physical Therapy Treatment  Patient Details  Name: [name] MRN: [id number] Date of Birth: [birth year] Referring Provider: Dr Hector Shade  Encounter Date: 01/28/2016      PT End of Session - 01/28/16 1529    Visit Number 9   Number of Visits 22   Date for PT Re-Evaluation 02/09/16   Authorization Type [employer] Medicare    Authorization Time Period 01/10/2016 to 02/28/2016    PT Start Time 1522   PT Stop Time 1602   PT Time Calculation (min) 40 min   Activity Tolerance Patient tolerated treatment well   Behavior During Therapy [facility] for tasks assessed/performed      Past Medical History  Diagnosis Date  . Hypertension   . Arthritis   . Allergy   . Hyperlipidemia   . Angioedema   . Hx of echocardiogram 10/2005    normal  . GERD (gastroesophageal reflux disease)   . Headache   . Thyroid disease     treated with radiation in 1980s     Past Surgical History  Procedure Laterality Date  . Abdominal hysterectomy    . Joint replacement    . Tubal ligation    . Colonoscopy  06/15/2011    Procedure: COLONOSCOPY;  Surgeon: Rogene Houston, MD;  [facility];  Service: Endoscopy;  Laterality: N/A;  . Replacement total knee Right 2000  . Cholecystectomy    . Eye surgery      removed cyst from right eye in june 2015. sept 9th surgery for eye lid drop on both eyes  . 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      Subjective Assessment - 01/28/16 1520    Subjective Pt stated knee stiff today, reduced pain scale to 2/10 today.  Reports headache pain scale 5/10   Pertinent History HTN, hyperlipdemia, and R TKA    Patient Stated Goals Pt's goal is to  ambulate without an AD, reduce her pain, and return to recereational activities ( Bocce, pickle ball, and horseshoes)    Currently in Pain? Yes   Pain Score 2    Pain Location Knee   Pain Orientation Left   Pain Descriptors / Indicators Sore;Tightness  stiffness   Pain Type Surgical pain   Pain Radiating Towards no radicular symptoms   Pain Onset 1 to 4 weeks ago   Pain Frequency Intermittent   Aggravating Factors  Max L knee flexion    Pain Relieving Factors rest, pain meds, and elevation   Effect of Pain on Daily Activities Difficulty with long distance ambulation and prolonged standing with ADLs/ cooking              [facility] Adult PT Treatment/Exercise - 01/28/16 0001    Knee/Hip Exercises: Stretches   Active Hamstring Stretch Left;3 reps;30 seconds   Active Hamstring Stretch Limitations standing 12in step   Quad Stretch Left;3 reps;30 seconds   Quad Stretch Limitations prone with rope   Knee: Self-Stretch to increase Flexion Left;10 seconds   Knee: Self-Stretch Limitations 10 reps on 8 in step   Gastroc Stretch 3 reps;30 seconds   Gastroc Stretch Limitations slant board   Knee/Hip Exercises: Aerobic  Recumbent Bike 5' rocking then able to make 2 full revolutions seat 19   Knee/Hip Exercises: Supine   Quad Sets 2 sets;10 reps;Left   Quad Sets Limitations 5 second hold   Short Arc [employer] 15 reps   Short Arc Quad Sets Limitations 5" holds   Heel Slides AAROM;AROM;Left;2 sets;10 reps   Heel Slides Limitations 8-95   Straight Leg Raises Left;15 reps   Straight Leg Raises Limitations verbal cues to maintain L TKE    Patellar Mobs complete   Knee Extension AAROM   Knee Extension Limitations 8degrees   Knee Flexion AAROM   Knee Flexion Limitations 95 degrees    Manual Therapy   Manual Therapy Joint mobilization;Soft tissue mobilization;Passive ROM;Myofascial release   Manual therapy comments Performed separatelty from all other interventions   Joint Mobilization Grade  I-III patellar mobs S<>I and M<>L   Soft tissue mobilization quadriceps and ITB supine with LE elevated   Myofascial Release scar tissue and perimeter of knee   Passive ROM L knee PROM into flexion/extension x 10 reps              PT Short Term Goals - 01/13/16 [id number]    PT SHORT TERM GOAL #1   Title Patient will independently verbalzie and demo initial TKA HEP in order to improve compliance and progression towards goals.    Time 3   Period Weeks   Status On-going   PT SHORT TERM GOAL #2   Title Patient will independently verbalize pain management strategies including use of cryotherapy in order to manage pain levels with ADLs and functional mobility activities.   Time 2   Period Weeks   Status On-going   PT SHORT TERM GOAL #3   Title Patient will report reduced max L knee pain to 5/10 on a VAS in order to improve tolerance with ther ex and indoor ambulation.   Time 4   Period Weeks   Status On-going   PT SHORT TERM GOAL #4   Title Patient will demo improved L knee AAROM to 5-100 degrees in order to improve L knee mobility and ROM with ambulation and transfers.   Time 4   Period Weeks   Status On-going   PT SHORT TERM GOAL #5   Title Patient will be able to complete all transfers independently with improved safety awareness in order to return to her PLOF and reduce the risk for falls.   Time 3   Period Weeks   Status Achieved           PT Long Term Goals - 01/13/16 0959    PT LONG TERM GOAL #1   Title Patient will demo improved L knee AAROM to 0-115 degrees in order to improve L knee mobility and ROM with stair climbing and leisure activities.     Time 7   Period Weeks   Status On-going   PT LONG TERM GOAL #2   Title Patient will demo improved L quad and HS strength to >4/5 MMT in order to improve performance with outdoor ambulation and transfers.   Time 7   Period Weeks   Status On-going   PT LONG TERM GOAL #3   Title TUG time will improve to <14 seconds with  the use of a SPC in order to reduce the risk for falls.     Time 7   Period Weeks   Status On-going   PT LONG TERM GOAL #4   Title Patient will independently ambulate  with a SPC with good heel to toe sequence on level terrain for >600' in order to progress towards becoming a community ambulator.     Time 7   Period Weeks   Status On-going   PT LONG TERM GOAL #5   Title Patient will improve her FOTO limitation to <50% in order to progress towards her PLOF with recreational activities.    Time 7   Period Weeks   Status On-going   PT LONG TERM GOAL #6   Title Patient will report decreased L knee pain to 0-5/10 on a VAS in order to improve quality of life and to be able to complete standing activities for >30 minutes.   Time 7   Period Weeks   Status On-going               Plan - 01/28/16 1748    Clinical Impression Statement Continued session focus on improing Lt knee ROM.  Increased time with manual soft tissue mobilization, Myofascial release techniques and PROM to improve muscle lengthening and reduce tightness this session.  Noted increased fascial restrictions on medial aspect of knee this session, reduced following manual.  Pt tolerated well to PROM for flexion and extension.  AROM at 8-95 folowing ROM based exercises, stretches and manual.  Reviewed exercises completing at home.  No reports of increased pain through session.   Rehab Potential Good   Clinical Impairments Affecting Rehab Potential Surgical L knee pain    PT Frequency 3x / week   PT Duration --  7 weeks   PT Treatment/Interventions ADLs/Self Care Home Management;Cryotherapy;Electrical Stimulation;Moist Heat;Therapeutic exercise;Balance training;Therapeutic activities;Functional mobility training;Stair training;Gait training;DME Instruction;Neuromuscular re-education;Patient/family education;Manual techniques;Taping;Passive range of motion   PT Next Visit Plan Continue focus on ROM, gait training with SPC, manual  tx for improved knee extension/flexion and edema control; addition of hip strengthening therex   PT Home Exercise Plan No changes made this session      Patient will benefit from skilled therapeutic intervention in order to improve the following deficits and impairments:  Abnormal gait, Decreased range of motion, Difficulty walking, Decreased safety awareness, Decreased endurance, Decreased activity tolerance, Pain, Impaired flexibility, Hypomobility, Decreased balance, Decreased mobility, Decreased strength, Increased edema, Postural dysfunction  Visit Diagnosis: Muscle weakness (generalized)  Stiffness of left knee, not elsewhere classified  Other abnormalities of gait and mobility  Unsteadiness on feet  Pain in left knee     Problem List Patient Active Problem List   Diagnosis Date Noted  . OA (osteoarthritis) of knee 12/27/2015  . Screening for osteoporosis 02/25/2014  . 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 Allergic reaction 05/13/2013  . Hyperlipemia 04/24/2013  . Hypertension 04/24/2013   Ihor Austin, [place]; [place]  Aldona Lento 01/28/2016, 5:55 PM   Garen Lah, PT, DPT   [place] [address] [place], [place], [address] Phone: [phone]   Fax:  [phone]  Name: [name] MRN: [id number] Date of Birth: 04-Jan-1941

## 2016-01-31 ENCOUNTER — Ambulatory Visit (HOSPITAL_COMMUNITY): Payer: Medicare Other | Admitting: Physical Therapy

## 2016-01-31 DIAGNOSIS — R2689 Other abnormalities of gait and mobility: Secondary | ICD-10-CM | POA: Diagnosis not present

## 2016-01-31 DIAGNOSIS — M25662 Stiffness of left knee, not elsewhere classified: Secondary | ICD-10-CM

## 2016-01-31 DIAGNOSIS — M25562 Pain in left knee: Secondary | ICD-10-CM | POA: Diagnosis not present

## 2016-01-31 DIAGNOSIS — M6281 Muscle weakness (generalized): Secondary | ICD-10-CM | POA: Diagnosis not present

## 2016-01-31 DIAGNOSIS — R269 Unspecified abnormalities of gait and mobility: Secondary | ICD-10-CM | POA: Diagnosis not present

## 2016-01-31 DIAGNOSIS — R2681 Unsteadiness on feet: Secondary | ICD-10-CM

## 2016-01-31 DIAGNOSIS — Z96652 Presence of left artificial knee joint: Secondary | ICD-10-CM | POA: Diagnosis not present

## 2016-01-31 NOTE — Patient Instructions (Signed)
Knee Extension Mobilization: Hang (Prone)    With table supporting thighs, place _0___ pound weight on right ankle. Hold _5-30___ minutes. Repeat _1___ times per set. Do _2___ sets per session. Do 3____ sessions per day.  http://orth.exer.us/722   Copyright  VHI. All rights reserved.  Knee Extension Mobilization: Towel Prop    With rolled towel under right ankle, place __3-5__ pound weight across knee. Hold __5-30__ minutes. Repeat _1___ times per set. Do _2___ sets per session. Do 3____ sessions per day.  http://orth.exer.us/720   Copyright  VHI. All rights reserved.  Knee Wall Slide    Slowly "walk" or slide feet on wall toward floor until stretch is felt in knees. Repeat __15__ times per set. Do _1___ sets per session. Do _3___ sessions per day.  http://orth.exer.us/674   Copyright  VHI. All rights reserved.

## 2016-01-31 NOTE — Therapy (Signed)
[place], [place], [address] Phone: [phone]   Fax:  [phone]  Physical Therapy Treatment  Patient Details  Name: [name] MRN: [id number] Date of Birth: [birth year] Referring Provider: Dr. Hector Shade   Encounter Date: 01/31/2016      PT End of Session - 01/31/16 0928    Visit Number 10   Number of Visits 22   Date for PT Re-Evaluation 03/01/16   Authorization Type [employer] Medicare    Authorization Time Period 01/10/2016 to 02/28/2016    PT Start Time 0902   PT Stop Time 0940   PT Time Calculation (min) 38 min   Activity Tolerance Patient tolerated treatment well      Past Medical History  Diagnosis Date  . Hypertension   . Arthritis   . Allergy   . Hyperlipidemia   . Angioedema   . Hx of echocardiogram 10/2005    normal  . GERD (gastroesophageal reflux disease)   . Headache   . Thyroid disease     treated with radiation in 1980s     Past Surgical History  Procedure Laterality Date  . Abdominal hysterectomy    . Joint replacement    . Tubal ligation    . Colonoscopy  06/15/2011    Procedure: COLONOSCOPY;  Surgeon: Rogene Houston, MD;  [facility];  Service: Endoscopy;  Laterality: N/A;  . Replacement total knee Right 2000  . Cholecystectomy    . Eye surgery      removed cyst from right eye in june 2015. sept 9th surgery for eye lid drop on both eyes  . 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      Subjective Assessment - 01/31/16 0906    Subjective Pt sees her MD tomorrow.  States that her knee feels stiff but no real pain    Pertinent History HTN, hyperlipdemia, and R TKA    Limitations Sitting;Standing;Walking   How long can you sit comfortably? able to sit for 2 hours was 1    How long  can you stand comfortably? 3040 minute was 10-15.   How long can you walk comfortably? walking with a cane now able to walk for 20 minutes now was 5 minutes.    Patient Stated Goals Pt's goal is to ambulate without an AD, reduce her pain, and return to recereational activities ( Bocce, pickle ball, and horseshoes)    Currently in Pain? No/denies            [facility] PT Assessment - 01/31/16 0001    Assessment   Medical Diagnosis s/p L TKA    Referring Provider Dr. Hector Shade    Onset Date/Surgical Date 12/27/15   Hand Dominance Right   Next MD Visit 02/01/2016   Prior Therapy Homecare PT ( 3 visits)    Precautions   Precautions Fall   Observation/Other Assessments   Focus on Therapeutic Outcomes (FOTO)  22% limitation  was 72%    AROM   Left Knee Extension 14  was 14    Left Knee Flexion 100  was 68    PROM   Left Knee Extension --  was 12   Left Knee Flexion --  was 72   Strength   Left Hip Flexion 5/5  was 3+/5   Left Hip Extension 3+/5  was 3-/5   Left Hip ABduction 4+/5  was 3+/5   Left Knee Flexion 5/5  was 3+/5    Left Knee Extension 5/5  was 3/+/5    Ambulation/Gait   Ambulation/Gait Yes   Ambulation/Gait Assistance 5: Supervision   Ambulation Distance (Feet) 226 Feet   Assistive device Straight cane   Gait Pattern Left flexed knee in stance;Left circumduction;Decreased dorsiflexion - left                     [facility] Adult PT Treatment/Exercise - 01/31/16 0001    Knee/Hip Exercises: Stretches   Knee: Self-Stretch to increase Flexion 3 reps   Gastroc Stretch 3 reps;30 seconds   Gastroc Stretch Limitations slant board   Knee/Hip Exercises: Standing   Heel Raises 10 reps   Knee Flexion Left;10 reps   Terminal Knee Extension Limitations x10   Rocker Board 2 minutes   SLS x3   Knee/Hip Exercises: Supine   Quad Sets 10 reps   Heel Slides Left;10 reps                PT Education - 01/31/16 [phone]    Education provided Yes    Education Details new self mobilization    Person(s) Educated Patient   Methods Explanation;Demonstration   Comprehension Verbalized understanding;Returned demonstration          PT Short Term Goals - 01/31/16 0947    PT SHORT TERM GOAL #1   Title Patient will independently verbalzie and demo initial TKA HEP in order to improve compliance and progression towards goals.    Time 3   Period Weeks   Status Achieved   PT SHORT TERM GOAL #2   Title Patient will independently verbalize pain management strategies including use of cryotherapy in order to manage pain levels with ADLs and functional mobility activities.   Time 2   Period Weeks   Status Achieved   PT SHORT TERM GOAL #3   Title Patient will report reduced max L knee pain to 5/10 on a VAS in order to improve tolerance with ther ex and indoor ambulation.   Time 4   Period Weeks   Status Achieved   PT SHORT TERM GOAL #4   Title Patient will demo improved L knee AAROM to 5-100 degrees in order to improve L knee mobility and ROM with ambulation and transfers.   Time 4   Period Weeks   Status On-going   PT SHORT TERM GOAL #5   Title Patient will be able to complete all transfers independently with improved safety awareness in order to return to her PLOF and reduce the risk for falls.   Time 3   Period Weeks   Status Achieved           PT Long Term Goals - 01/31/16 0947    PT LONG TERM GOAL #1   Title Patient will demo improved L knee AAROM to 0-115 degrees in order to improve L knee mobility and ROM with stair climbing and leisure activities.     Time 7   Period Weeks   Status On-going   PT LONG TERM GOAL #2   Title Patient will demo improved L quad and HS strength to >4/5 MMT in order to improve performance with outdoor ambulation and transfers.   Time 7   Period Weeks   Status Achieved   PT LONG TERM GOAL #3   Title TUG  time will improve to <14 seconds with the use of a SPC in order to reduce the risk for falls.      Time 7   Period Weeks   Status Achieved   PT LONG TERM GOAL #4   Title Patient will independently ambulate with a SPC with good heel to toe sequence on level terrain for >600' in order to progress towards becoming a community ambulator.     Period Weeks   Status On-going   PT LONG TERM GOAL #5   Title Patient will improve her FOTO limitation to <50% in order to progress towards her PLOF with recreational activities.    Time 7   Period Weeks   Status Achieved   PT LONG TERM GOAL #6   Title Patient will report decreased L knee pain to 0-5/10 on a VAS in order to improve quality of life and to be able to complete standing activities for >30 minutes.   Time 7   Period Weeks   Status Achieved               Plan - 2016-02-23 0944    Clinical Impression Statement Pt reassessed with main deficit ROM and balance at this time. Only mm group lacking strength is hip extension.  Pt will continiue to benefit from skilled therapy to improve balance and ROM to be able to return to competing in the senior games as desired.l   Rehab Potential Good   Clinical Impairments Affecting Rehab Potential Surgical L knee pain    PT Treatment/Interventions ADLs/Self Care Home Management;Cryotherapy;Electrical Stimulation;Moist Heat;Therapeutic exercise;Balance training;Therapeutic activities;Functional mobility training;Stair training;Gait training;DME Instruction;Neuromuscular re-education;Patient/family education;Manual techniques;Taping;Passive range of motion   PT Next Visit Plan focus on ROM and balance.       Patient will benefit from skilled therapeutic intervention in order to improve the following deficits and impairments:  Abnormal gait, Decreased range of motion, Difficulty walking, Decreased safety awareness, Decreased endurance, Decreased activity tolerance, Pain, Impaired flexibility, Hypomobility, Decreased balance, Decreased mobility, Decreased strength, Increased edema, Postural  dysfunction  Visit Diagnosis: Stiffness of left knee, not elsewhere classified  Muscle weakness (generalized)  Other abnormalities of gait and mobility  Unsteadiness on feet       G-Codes - 2016-02-23 1032    Functional Assessment Tool Used FOTO and clincial findings including ROM, MMT, pain, edema, gait deviations, and joint mobility    Functional Limitation Mobility: Walking and moving around   Mobility: Walking and Moving Around Current Status [phone]) At least 20 percent but less than 40 percent impaired, limited or restricted   Mobility: Walking and Moving Around Goal Status [phone]) At least 40 percent but less than 60 percent impaired, limited or restricted      Problem List Patient Active Problem List   Diagnosis Date Noted  . OA (osteoarthritis) of knee 12/27/2015  . Screening for osteoporosis 02/25/2014  . 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 Allergic reaction 05/13/2013  . Hyperlipemia 04/24/2013  . Hypertension 04/24/2013   Rayetta Humphrey, PT CLT [phone] 02-23-2016, 10:33 AM  [place] [address] [place], [place], [address] Phone: [phone]   Fax:  [phone]  Name: SHALAYNA CRAIL MRN: [id number] Date of Birth: 1941-09-24

## 2016-02-01 DIAGNOSIS — Z471 Aftercare following joint replacement surgery: Secondary | ICD-10-CM | POA: Diagnosis not present

## 2016-02-01 DIAGNOSIS — Z96652 Presence of left artificial knee joint: Secondary | ICD-10-CM | POA: Diagnosis not present

## 2016-02-02 ENCOUNTER — Ambulatory Visit (HOSPITAL_COMMUNITY): Payer: Medicare Other

## 2016-02-02 ENCOUNTER — Encounter (HOSPITAL_COMMUNITY): Payer: Self-pay

## 2016-02-02 DIAGNOSIS — M25562 Pain in left knee: Secondary | ICD-10-CM

## 2016-02-02 DIAGNOSIS — R2689 Other abnormalities of gait and mobility: Secondary | ICD-10-CM | POA: Diagnosis not present

## 2016-02-02 DIAGNOSIS — M25662 Stiffness of left knee, not elsewhere classified: Secondary | ICD-10-CM | POA: Diagnosis not present

## 2016-02-02 DIAGNOSIS — R2681 Unsteadiness on feet: Secondary | ICD-10-CM | POA: Diagnosis not present

## 2016-02-02 DIAGNOSIS — Z96652 Presence of left artificial knee joint: Secondary | ICD-10-CM | POA: Diagnosis not present

## 2016-02-02 DIAGNOSIS — R269 Unspecified abnormalities of gait and mobility: Secondary | ICD-10-CM | POA: Diagnosis not present

## 2016-02-02 DIAGNOSIS — M6281 Muscle weakness (generalized): Secondary | ICD-10-CM | POA: Diagnosis not present

## 2016-02-02 NOTE — Therapy (Signed)
[place], [place], [address] Phone: [phone]   Fax:  [phone]  Physical Therapy Treatment  Patient Details  Name: [name] MRN: [id number] Date of Birth: 1941/09/05 Referring Provider: Dr. Hector Shade   Encounter Date: 02/02/2016      PT End of Session - 02/02/16 1132    Visit Number 11   Number of Visits 22   Date for PT Re-Evaluation 03/01/16   Authorization Type [employer] Medicare    Authorization Time Period 01/10/2016 to 02/28/2016    PT Start Time 1118   PT Stop Time 1203   PT Time Calculation (min) 45 min   Activity Tolerance Patient tolerated treatment well   Behavior During Therapy [facility] for tasks assessed/performed      Past Medical History  Diagnosis Date  . Hypertension   . Arthritis   . Allergy   . Hyperlipidemia   . Angioedema   . Hx of echocardiogram 10/2005    normal  . GERD (gastroesophageal reflux disease)   . Headache   . Thyroid disease     treated with radiation in 1980s     Past Surgical History  Procedure Laterality Date  . Abdominal hysterectomy    . Joint replacement    . Tubal ligation    . Colonoscopy  06/15/2011    Procedure: COLONOSCOPY;  Surgeon: Rogene Houston, MD;  [facility];  Service: Endoscopy;  Laterality: N/A;  . Replacement total knee Right 2000  . Cholecystectomy    . Eye surgery      removed cyst from right eye in june 2015. sept 9th surgery for eye lid drop on both eyes  . 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      Subjective Assessment - 02/02/16 1123    Subjective Patient reported that she saw her surgeon yesterday and that he was pleased with her progress thus far. According to the patient, her surgeon ordered a reduction of PT frequency from 3x/week  to 2x/week. L knee pain was rated a 0/10 on a VAS upon arrival. Next post-op appt with Dr. Wynelle Link is at the end of next month (May).    Pertinent History HTN, hyperlipdemia, and R TKA    Limitations Sitting;Standing;Walking   Patient Stated Goals Pt's goal is to ambulate without an AD, reduce her pain, and return to recereational activities ( Bocce, pickle ball, and horseshoes)    Currently in Pain? No/denies   Pain Score 0-No pain  L knee pain has ranged between a 0-5/10 on a VAS   Pain Location Knee   Pain Orientation Left   Pain Descriptors / Indicators Sore;Tightness   Pain Type Surgical pain   Pain Radiating Towards None   Pain Onset More than a month ago   Pain Frequency Intermittent   Aggravating Factors  Max L knee flexion    Pain Relieving Factors rest, pain meds, and elevation   Effect of Pain on Daily Activities Difficulty with long distance ambulation and prolonged standing with ADLs                         [facility] Adult PT Treatment/Exercise - 02/02/16 0001    Knee/Hip Exercises: Stretches   Active Hamstring Stretch Left;3  reps;30 seconds   Active Hamstring Stretch Limitations standing 12in step   Quad Stretch Left;3 reps;30 seconds   Quad Stretch Limitations in Parker position with strap    Knee: Self-Stretch to increase Flexion Left   Knee: Self-Stretch Limitations 10 reps on 8 in step   Knee/Hip Exercises: Aerobic   Recumbent Bike 5' rocking forward/retro to improve ROM   Knee/Hip Exercises: Standing   SLS 4 sets, bilaterally with occasional UE support    Other Standing Knee Exercises Tandem stance on airex pad 4 sets, bilaterally with occasional UE support    Knee/Hip Exercises: Supine   Quad Sets Left;1 set;15 reps   Heel Slides AAROM;AROM;Left;2 sets;10 reps   Knee Extension AAROM   Knee Extension Limitations 6 degrees   Knee Flexion AAROM   Knee Flexion Limitations 97 degrees   Manual Therapy   Manual Therapy Joint mobilization;Soft tissue  mobilization;Passive ROM;Myofascial release   Manual therapy comments Performed separatelty from all other interventions   Joint Mobilization Grade I-IV; L patellar mobs S<>I and M<>L in supine;  L tibiofemoral posterior glide of femur on tibia in order to improve L knee extension using grade I-III  imporved L knee extensiion measured from 9 deg to 6 deg   Soft tissue mobilization quadriceps and ITB supine with LE elevated   Myofascial Release scar tissue and perimeter of knee   Passive ROM L knee PROM into flexion/extension x 10 reps                 PT Education - 02/02/16 1131    Education provided Yes   Education Details HEP and self-scar mobs    Person(s) Educated Patient   Methods Demonstration;Explanation   Comprehension Returned demonstration;Verbalized understanding          PT Short Term Goals - 01/31/16 0947    PT SHORT TERM GOAL #1   Title Patient will independently verbalzie and demo initial TKA HEP in order to improve compliance and progression towards goals.    Time 3   Period Weeks   Status Achieved   PT SHORT TERM GOAL #2   Title Patient will independently verbalize pain management strategies including use of cryotherapy in order to manage pain levels with ADLs and functional mobility activities.   Time 2   Period Weeks   Status Achieved   PT SHORT TERM GOAL #3   Title Patient will report reduced max L knee pain to 5/10 on a VAS in order to improve tolerance with ther ex and indoor ambulation.   Time 4   Period Weeks   Status Achieved   PT SHORT TERM GOAL #4   Title Patient will demo improved L knee AAROM to 5-100 degrees in order to improve L knee mobility and ROM with ambulation and transfers.   Time 4   Period Weeks   Status On-going   PT SHORT TERM GOAL #5   Title Patient will be able to complete all transfers independently with improved safety awareness in order to return to her PLOF and reduce the risk for falls.   Time 3   Period Weeks    Status Achieved           PT Long Term Goals - 01/31/16 0947    PT LONG TERM GOAL #1   Title Patient will demo improved L knee AAROM to 0-115 degrees in order to improve L knee mobility and ROM with stair climbing and leisure activities.     Time 7  Period Weeks   Status On-going   PT LONG TERM GOAL #2   Title Patient will demo improved L quad and HS strength to >4/5 MMT in order to improve performance with outdoor ambulation and transfers.   Time 7   Period Weeks   Status Achieved   PT LONG TERM GOAL #3   Title TUG time will improve to <14 seconds with the use of a SPC in order to reduce the risk for falls.     Time 7   Period Weeks   Status Achieved   PT LONG TERM GOAL #4   Title Patient will independently ambulate with a SPC with good heel to toe sequence on level terrain for >600' in order to progress towards becoming a community ambulator.     Period Weeks   Status On-going   PT LONG TERM GOAL #5   Title Patient will improve her FOTO limitation to <50% in order to progress towards her PLOF with recreational activities.    Time 7   Period Weeks   Status Achieved   PT LONG TERM GOAL #6   Title Patient will report decreased L knee pain to 0-5/10 on a VAS in order to improve quality of life and to be able to complete standing activities for >30 minutes.   Time 7   Period Weeks   Status Achieved               Plan - 02/02/16 1132    Clinical Impression Statement PT tx was focused on L knee ROM/mobility and flexibility. Completed manual therapy techniques with good tolerance reported and improved L knee AAROM measured to 6-97 degrees. Completed SLS on level terrain and tandem stance on airex pad with fair balance assessed. Patient was able to maintain tandem stance for 26 seconds (L lead LE) and 20 seconds ( R lead LE). Patient is making steady progress towards stated goals and would benefit from continued skilled PT to focus on L knee ROM/mobility and balance. PT  frequency was decreased per patient and MD request to 2x/week. Continue with current POC.   Rehab Potential Good   Clinical Impairments Affecting Rehab Potential Surgical L knee pain    PT Frequency 2x / week   PT Duration 4 weeks   PT Treatment/Interventions ADLs/Self Care Home Management;Cryotherapy;Electrical Stimulation;Moist Heat;Therapeutic exercise;Balance training;Therapeutic activities;Functional mobility training;Stair training;Gait training;DME Instruction;Neuromuscular re-education;Patient/family education;Manual techniques;Taping;Passive range of motion   PT Next Visit Plan Next PT visit to focus on L knee ROM ther ex, manual technques, and balance.    PT Home Exercise Plan No changes made this session   Consulted and Agree with Plan of Care Patient      Patient will benefit from skilled therapeutic intervention in order to improve the following deficits and impairments:  Abnormal gait, Decreased range of motion, Difficulty walking, Decreased safety awareness, Decreased endurance, Decreased activity tolerance, Pain, Impaired flexibility, Hypomobility, Decreased balance, Decreased mobility, Decreased strength, Increased edema, Postural dysfunction  Visit Diagnosis: Stiffness of left knee, not elsewhere classified  Muscle weakness (generalized)  Other abnormalities of gait and mobility  Unsteadiness on feet  Pain in left knee     Problem List Patient Active Problem List   Diagnosis Date Noted  . OA (osteoarthritis) of knee 12/27/2015  . Screening for osteoporosis 02/25/2014  . 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 Allergic reaction 05/13/2013  . Hyperlipemia 04/24/2013  . Hypertension 04/24/2013    Garen Lah, PT, DPT  02/02/2016, 12:13 PM  [place], [place], [address] Phone: [phone]   Fax:  [phone]  Name: ELLYSSA SHAHAN MRN: [id number] Date  of Birth: [birth year]

## 2016-02-04 ENCOUNTER — Ambulatory Visit (HOSPITAL_COMMUNITY): Payer: Medicare Other

## 2016-02-04 DIAGNOSIS — M25662 Stiffness of left knee, not elsewhere classified: Secondary | ICD-10-CM

## 2016-02-04 DIAGNOSIS — R2689 Other abnormalities of gait and mobility: Secondary | ICD-10-CM | POA: Diagnosis not present

## 2016-02-04 DIAGNOSIS — R269 Unspecified abnormalities of gait and mobility: Secondary | ICD-10-CM | POA: Diagnosis not present

## 2016-02-04 DIAGNOSIS — M25562 Pain in left knee: Secondary | ICD-10-CM

## 2016-02-04 DIAGNOSIS — M6281 Muscle weakness (generalized): Secondary | ICD-10-CM

## 2016-02-04 DIAGNOSIS — R2681 Unsteadiness on feet: Secondary | ICD-10-CM

## 2016-02-04 DIAGNOSIS — Z96652 Presence of left artificial knee joint: Secondary | ICD-10-CM | POA: Diagnosis not present

## 2016-02-04 NOTE — Therapy (Signed)
[place], [place], [address] Phone: [phone]   Fax:  [phone]  Physical Therapy Treatment  Patient Details  Name: [name] MRN: [id number] Date of Birth: [birth year] Referring Provider: Dr. Hector Shade   Encounter Date: 02/04/2016      PT End of Session - 02/04/16 1616    Visit Number 12   Number of Visits 22   Date for PT Re-Evaluation 03/01/16   Authorization Type [employer] Medicare    Authorization Time Period 01/10/2016 to 02/28/2016    PT Start Time 1604   PT Stop Time 1649   PT Time Calculation (min) 45 min   Activity Tolerance Patient tolerated treatment well   Behavior During Therapy [facility] for tasks assessed/performed      Past Medical History  Diagnosis Date  . Hypertension   . Arthritis   . Allergy   . Hyperlipidemia   . Angioedema   . Hx of echocardiogram 10/2005    normal  . GERD (gastroesophageal reflux disease)   . Headache   . Thyroid disease     treated with radiation in 1980s     Past Surgical History  Procedure Laterality Date  . Abdominal hysterectomy    . Joint replacement    . Tubal ligation    . Colonoscopy  06/15/2011    Procedure: COLONOSCOPY;  Surgeon: Rogene Houston, MD;  [facility];  Service: Endoscopy;  Laterality: N/A;  . Replacement total knee Right 2000  . Cholecystectomy    . Eye surgery      removed cyst from right eye in june 2015. sept 9th surgery for eye lid drop on both eyes  . 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      Subjective Assessment - 02/04/16 1608    Subjective Pt states that she was visiting her granddaughter who has leukemia in [place], has been sitting for long periods of time todayy in cold room and feels that she is stiff because of that.      Pertinent History HTN, hyperlipdemia, and R TKA    Patient Stated Goals Pt's goal is to ambulate without an AD, reduce her pain, and return to recereational activities ( Bocce, pickle ball, and horseshoes)    Currently in Pain? Yes   Pain Score 3    Pain Location Knee   Pain Orientation Left   Pain Descriptors / Indicators Other (Comment)  stiffness   Pain Type Surgical pain   Pain Onset More than a month ago   Pain Frequency Intermittent   Aggravating Factors  L knee flexion   Pain Relieving Factors rest, ice, pain medication   Effect of Pain on Daily Activities Difficulty with gait, and increased time for ADL's.              [facility] Adult PT Treatment/Exercise - 02/04/16 0001    Knee/Hip Exercises: Stretches   Active Hamstring Stretch Left;3 reps;30 seconds   Active Hamstring Stretch Limitations standing 12in step   Quad Stretch Left;3 reps;30 seconds   Quad Stretch Limitations in [place] position with strap    Knee: Self-Stretch to increase Flexion Left   Knee: Self-Stretch Limitations 10 reps on 8 in step   Gastroc Stretch Both;3 reps;30 seconds   Gastroc Stretch Limitations  slant board   Knee/Hip Exercises: Aerobic   Recumbent Bike full revolution for ROM at seat #20 x 5 min initial   Knee/Hip Exercises: Standing   Heel Raises 10 reps;Both  with B UE's on parallel bars.    Heel Raises Limitations Toe raises with tactile cueing for proper form   Knee Flexion Left;10 reps  B heel raise 3x10 reps with B UE support   Knee Flexion Limitations B UE support on parallel bars   Knee/Hip Exercises: Supine   Quad Sets Left;1 set;15 reps   Short Arc Quad Sets 15 reps   Short Arc Quad Sets Limitations 5"   Heel Slides AAROM;15 reps   Heel Slides Limitations 5-102   Knee Extension PROM   Knee Extension Limitations 4 degrees   Knee Flexion AAROM   Knee Flexion Limitations 102 degrees   Manual Therapy   Manual Therapy Soft tissue mobilization;Myofascial release;Joint  mobilization;Passive ROM   Manual therapy comments Performed separatelty from all other interventions   Joint Mobilization Grade I-IV; L patellar mobs S<>I and M<>L in supine; L tibiofemoral posterior glide of femur on tibia in order to improve L knee extension using grade I-III   Soft tissue mobilization quadriceps and ITB supine with LE elevated   Myofascial Release scar tissue and perimeter of knee   Passive ROM L knee PROM into flexion/extension x 10 reps                   PT Short Term Goals - 01/31/16 0947    PT SHORT TERM GOAL #1   Title Patient will independently verbalzie and demo initial TKA HEP in order to improve compliance and progression towards goals.    Time 3   Period Weeks   Status Achieved   PT SHORT TERM GOAL #2   Title Patient will independently verbalize pain management strategies including use of cryotherapy in order to manage pain levels with ADLs and functional mobility activities.   Time 2   Period Weeks   Status Achieved   PT SHORT TERM GOAL #3   Title Patient will report reduced max L knee pain to 5/10 on a VAS in order to improve tolerance with ther ex and indoor ambulation.   Time 4   Period Weeks   Status Achieved   PT SHORT TERM GOAL #4   Title Patient will demo improved L knee AAROM to 5-100 degrees in order to improve L knee mobility and ROM with ambulation and transfers.   Time 4   Period Weeks   Status On-going   PT SHORT TERM GOAL #5   Title Patient will be able to complete all transfers independently with improved safety awareness in order to return to her PLOF and reduce the risk for falls.   Time 3   Period Weeks   Status Achieved           PT Long Term Goals - 01/31/16 0947    PT LONG TERM GOAL #1   Title Patient will demo improved L knee AAROM to 0-115 degrees in order to improve L knee mobility and ROM with stair climbing and leisure activities.     Time 7   Period Weeks   Status On-going   PT LONG TERM GOAL #2    Title Patient will demo improved L quad and HS strength to >4/5 MMT in order to improve performance with outdoor ambulation and transfers.   Time 7   Period Weeks   Status Achieved  PT LONG TERM GOAL #3   Title TUG time will improve to <14 seconds with the use of a SPC in order to reduce the risk for falls.     Time 7   Period Weeks   Status Achieved   PT LONG TERM GOAL #4   Title Patient will independently ambulate with a SPC with good heel to toe sequence on level terrain for >600' in order to progress towards becoming a community ambulator.     Period Weeks   Status On-going   PT LONG TERM GOAL #5   Title Patient will improve her FOTO limitation to <50% in order to progress towards her PLOF with recreational activities.    Time 7   Period Weeks   Status Achieved   PT LONG TERM GOAL #6   Title Patient will report decreased L knee pain to 0-5/10 on a VAS in order to improve quality of life and to be able to complete standing activities for >30 minutes.   Time 7   Period Weeks   Status Achieved               Plan - 02/04/16 1654    Clinical Impression Statement Session focus on improving knee mobility to improve ROM and reduce stiffness.  Pt making progressing with AROM with abilty to complete full revolution on bicycle for 5' on seat 20.  Following warm up on bike (no charge) began with manual soft tissue mobilization technqiues to reduce tightness on quadriceps and medial hamstrings to assist with ROM.  Pt tolerated well with PROM for flexion and extension.  Improved AROM 5-102 following ROM based exercises, functional stretches, and manual.  End of session pt reports no increased in pain.  Encouraged to apply ice for pain and edema control.     Rehab Potential Good   Clinical Impairments Affecting Rehab Potential Surgical L knee pain    PT Frequency 2x / week   PT Duration 4 weeks   PT Treatment/Interventions ADLs/Self Care Home Management;Cryotherapy;Electrical  Stimulation;Moist Heat;Therapeutic exercise;Balance training;Therapeutic activities;Functional mobility training;Stair training;Gait training;DME Instruction;Neuromuscular re-education;Patient/family education;Manual techniques;Taping;Passive range of motion   PT Next Visit Plan Next PT visit to focus on L knee ROM ther ex, manual technques, and balance.       Patient will benefit from skilled therapeutic intervention in order to improve the following deficits and impairments:  Abnormal gait, Decreased range of motion, Difficulty walking, Decreased safety awareness, Decreased endurance, Decreased activity tolerance, Pain, Impaired flexibility, Hypomobility, Decreased balance, Decreased mobility, Decreased strength, Increased edema, Postural dysfunction  Visit Diagnosis: Stiffness of left knee, not elsewhere classified  Muscle weakness (generalized)  Other abnormalities of gait and mobility  Unsteadiness on feet  Pain in left knee     Problem List Patient Active Problem List   Diagnosis Date Noted  . OA (osteoarthritis) of knee 12/27/2015  . Screening for osteoporosis 02/25/2014  . 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 Allergic reaction 05/13/2013  . Hyperlipemia 04/24/2013  . Hypertension 04/24/2013   Ihor Austin, [place]; [place]  Aldona Lento 02/04/2016, 5:03 PM  [place], [place], [address] Phone: [phone]   Fax:  [phone]  Name: AALYAH WERLINE MRN: [id number] Date of Birth: Feb 10, 1941

## 2016-02-07 ENCOUNTER — Ambulatory Visit (HOSPITAL_COMMUNITY): Payer: Medicare Other | Admitting: Physical Therapy

## 2016-02-07 DIAGNOSIS — M25662 Stiffness of left knee, not elsewhere classified: Secondary | ICD-10-CM | POA: Diagnosis not present

## 2016-02-07 DIAGNOSIS — R2689 Other abnormalities of gait and mobility: Secondary | ICD-10-CM

## 2016-02-07 DIAGNOSIS — M25562 Pain in left knee: Secondary | ICD-10-CM

## 2016-02-07 DIAGNOSIS — Z96652 Presence of left artificial knee joint: Secondary | ICD-10-CM | POA: Diagnosis not present

## 2016-02-07 DIAGNOSIS — R2681 Unsteadiness on feet: Secondary | ICD-10-CM

## 2016-02-07 DIAGNOSIS — M6281 Muscle weakness (generalized): Secondary | ICD-10-CM | POA: Diagnosis not present

## 2016-02-07 DIAGNOSIS — R269 Unspecified abnormalities of gait and mobility: Secondary | ICD-10-CM | POA: Diagnosis not present

## 2016-02-07 NOTE — Therapy (Signed)
[place], [place], [address] Phone: [phone]   Fax:  [phone]  Physical Therapy Treatment  Patient Details  Name: [name] MRN: [id number] Date of Birth: 1941-06-19 Referring Provider: Dr. Hector Shade   Encounter Date: 02/07/2016      PT End of Session - 02/07/16 1049    Visit Number 13   Number of Visits 22   Date for PT Re-Evaluation 03/01/16   Authorization Type [employer] Medicare    Authorization Time Period 01/10/2016 to 02/28/2016    Authorization - Visit Number 13   Authorization - Number of Visits 20   PT Start Time 0950   PT Stop Time 1032   PT Time Calculation (min) 42 min   Activity Tolerance Patient tolerated treatment well   Behavior During Therapy [facility] for tasks assessed/performed      Past Medical History  Diagnosis Date  . Hypertension   . Arthritis   . Allergy   . Hyperlipidemia   . Angioedema   . Hx of echocardiogram 10/2005    normal  . GERD (gastroesophageal reflux disease)   . Headache   . Thyroid disease     treated with radiation in 1980s     Past Surgical History  Procedure Laterality Date  . Abdominal hysterectomy    . Joint replacement    . Tubal ligation    . Colonoscopy  06/15/2011    Procedure: COLONOSCOPY;  Surgeon: Rogene Houston, MD;  [facility];  Service: Endoscopy;  Laterality: N/A;  . Replacement total knee Right 2000  . Cholecystectomy    . Eye surgery      removed cyst from right eye in june 2015. sept 9th surgery for eye lid drop on both eyes  . 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      Subjective Assessment - 02/07/16 1013    Subjective PT states she went to church yesterday for the first time in a while.  Pt states she used her SPC and negotiated  steps in step-to pattern.  States she did well without difficulty and currently has no pain. pt comes today wtihout AD.                         [facility] Adult PT Treatment/Exercise - 02/07/16 0001    Knee/Hip Exercises: Stretches   Active Hamstring Stretch Left;3 reps;30 seconds   Active Hamstring Stretch Limitations standing 12in step   Knee: Self-Stretch to increase Flexion Left   Knee: Self-Stretch Limitations 10 reps on 12 in step   Gastroc Stretch Both;3 reps;30 seconds   Gastroc Stretch Limitations slant board   Knee/Hip Exercises: Aerobic   Recumbent Bike full revolution for ROM at seat #19 x 5 min initial   Knee/Hip Exercises: Standing   Heel Raises 10 reps;Both   Heel Raises Limitations Toe raises with tactile cueing for proper form   Knee Flexion Left;10 reps   Knee Flexion Limitations with 4" box working on TKF   Lateral Step Up Left;10 reps;Step Height: 4";Hand Hold: 1   Forward Step Up Left;10 reps;Step Height: 4";Hand Hold: 1   Step Down Left;10 reps;Hand Hold: 1;Step Height: 4"   SLS max of 3, 12" with Lt LE no UE  assist   SLS with Vectors 10X5" with Lt and 1 UE assist   Knee/Hip Exercises: Supine   Quad Sets Left;1 set;15 reps   Heel Slides AAROM;15 reps   Manual Therapy   Manual Therapy Myofascial release   Manual therapy comments Performed separatelty from all other interventions at end of session   Myofascial Release scar tissue and perimeter of knee   Passive ROM L knee PROM into flexion/extension x 10 reps                   PT Short Term Goals - 01/31/16 0947    PT SHORT TERM GOAL #1   Title Patient will independently verbalzie and demo initial TKA HEP in order to improve compliance and progression towards goals.    Time 3   Period Weeks   Status Achieved   PT SHORT TERM GOAL #2   Title Patient will independently verbalize pain management strategies including use of cryotherapy in order to manage pain levels with ADLs and  functional mobility activities.   Time 2   Period Weeks   Status Achieved   PT SHORT TERM GOAL #3   Title Patient will report reduced max L knee pain to 5/10 on a VAS in order to improve tolerance with ther ex and indoor ambulation.   Time 4   Period Weeks   Status Achieved   PT SHORT TERM GOAL #4   Title Patient will demo improved L knee AAROM to 5-100 degrees in order to improve L knee mobility and ROM with ambulation and transfers.   Time 4   Period Weeks   Status On-going   PT SHORT TERM GOAL #5   Title Patient will be able to complete all transfers independently with improved safety awareness in order to return to her PLOF and reduce the risk for falls.   Time 3   Period Weeks   Status Achieved           PT Long Term Goals - 01/31/16 0947    PT LONG TERM GOAL #1   Title Patient will demo improved L knee AAROM to 0-115 degrees in order to improve L knee mobility and ROM with stair climbing and leisure activities.     Time 7   Period Weeks   Status On-going   PT LONG TERM GOAL #2   Title Patient will demo improved L quad and HS strength to >4/5 MMT in order to improve performance with outdoor ambulation and transfers.   Time 7   Period Weeks   Status Achieved   PT LONG TERM GOAL #3   Title TUG time will improve to <14 seconds with the use of a SPC in order to reduce the risk for falls.     Time 7   Period Weeks   Status Achieved   PT LONG TERM GOAL #4   Title Patient will independently ambulate with a SPC with good heel to toe sequence on level terrain for >600' in order to progress towards becoming a community ambulator.     Period Weeks   Status On-going   PT LONG TERM GOAL #5   Title Patient will improve her FOTO limitation to <50% in order to progress towards her PLOF with recreational activities.    Time 7   Period Weeks   Status Achieved   PT LONG TERM GOAL #6   Title Patient will report decreased L knee pain to 0-5/10 on a VAS in order to improve  quality  of life and to be able to complete standing activities for >30 minutes.   Time 7   Period Weeks   Status Achieved               Plan - 02/07/16 1051    Clinical Impression Statement continued focus on increasing Lt knee stabiliy while improving ROM for flexion and extension.  Pt with max hold of 12" today with SLS without UE assistance.  Added SLS vector to further improving stability.  Also began working on stairs via step ups to improve concentric and eccentric strength of Lt LE.  Myofascial techniques completed to decrease adhesions most notable in inferior aspect.  Pt instructed to continue working on HEP and progessing exercises.      Rehab Potential Good   Clinical Impairments Affecting Rehab Potential Surgical L knee pain    PT Frequency 2x / week   PT Duration 4 weeks   PT Treatment/Interventions ADLs/Self Care Home Management;Cryotherapy;Electrical Stimulation;Moist Heat;Therapeutic exercise;Balance training;Therapeutic activities;Functional mobility training;Stair training;Gait training;DME Instruction;Neuromuscular re-education;Patient/family education;Manual techniques;Taping;Passive range of motion   PT Next Visit Plan Continue to progress Lt knee stability to improve balance and overall ROM.       Patient will benefit from skilled therapeutic intervention in order to improve the following deficits and impairments:  Abnormal gait, Decreased range of motion, Difficulty walking, Decreased safety awareness, Decreased endurance, Decreased activity tolerance, Pain, Impaired flexibility, Hypomobility, Decreased balance, Decreased mobility, Decreased strength, Increased edema, Postural dysfunction  Visit Diagnosis: Stiffness of left knee, not elsewhere classified  Muscle weakness (generalized)  Other abnormalities of gait and mobility  Unsteadiness on feet  Pain in left knee     Problem List Patient Active Problem List   Diagnosis Date Noted  . OA (osteoarthritis) of  knee 12/27/2015  . Screening for osteoporosis 02/25/2014  . 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 Allergic reaction 05/13/2013  . Hyperlipemia 04/24/2013  . Hypertension 04/24/2013    Teena Irani, PTA/CLT [phone]  02/07/2016, 11:03 AM  [place] [address] [place], [place], [address] Phone: [phone]   Fax:  [phone]  Name: REIGHAN DORSEY MRN: [id number] Date of Birth: Oct 02, 1941

## 2016-02-09 ENCOUNTER — Ambulatory Visit (HOSPITAL_COMMUNITY): Payer: Medicare Other

## 2016-02-09 DIAGNOSIS — M6281 Muscle weakness (generalized): Secondary | ICD-10-CM

## 2016-02-09 DIAGNOSIS — R2689 Other abnormalities of gait and mobility: Secondary | ICD-10-CM

## 2016-02-09 DIAGNOSIS — M25662 Stiffness of left knee, not elsewhere classified: Secondary | ICD-10-CM

## 2016-02-09 DIAGNOSIS — M25562 Pain in left knee: Secondary | ICD-10-CM

## 2016-02-09 DIAGNOSIS — Z96652 Presence of left artificial knee joint: Secondary | ICD-10-CM | POA: Diagnosis not present

## 2016-02-09 DIAGNOSIS — R2681 Unsteadiness on feet: Secondary | ICD-10-CM | POA: Diagnosis not present

## 2016-02-09 DIAGNOSIS — R269 Unspecified abnormalities of gait and mobility: Secondary | ICD-10-CM | POA: Diagnosis not present

## 2016-02-09 NOTE — Therapy (Signed)
[place], [place], [address] Phone: [phone]   Fax:  [phone]  Physical Therapy Treatment  Patient Details  Name: [name] MRN: [id number] Date of Birth: 05-14-1941 Referring Provider: Dr. Hector Shade   Encounter Date: 02/09/2016      PT End of Session - 02/09/16 1008    Visit Number 14   Number of Visits 22   Date for PT Re-Evaluation 03/01/16   Authorization Type [employer] Medicare    Authorization Time Period 01/10/2016 to 02/28/2016    Authorization - Visit Number 14   Authorization - Number of Visits 20   PT Start Time 0956  Pt late for apt today   PT Stop Time 1035   PT Time Calculation (min) 39 min   Activity Tolerance Patient tolerated treatment well   Behavior During Therapy [facility] for tasks assessed/performed      Past Medical History  Diagnosis Date  . Hypertension   . Arthritis   . Allergy   . Hyperlipidemia   . Angioedema   . Hx of echocardiogram 10/2005    normal  . GERD (gastroesophageal reflux disease)   . Headache   . Thyroid disease     treated with radiation in 1980s     Past Surgical History  Procedure Laterality Date  . Abdominal hysterectomy    . Joint replacement    . Tubal ligation    . Colonoscopy  06/15/2011    Procedure: COLONOSCOPY;  Surgeon: Rogene Houston, MD;  [facility];  Service: Endoscopy;  Laterality: N/A;  . Replacement total knee Right 2000  . Cholecystectomy    . Eye surgery      removed cyst from right eye in june 2015. sept 9th surgery for eye lid drop on both eyes  . 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      Subjective Assessment - 02/09/16 1002    Subjective Pt stated with a smile she went out in yard yesterday and cleaned out yesterday, reports  first time back to church since January last Sunday.  No reports of pain today, knee is stiff.     Pertinent History HTN, hyperlipdemia, and R TKA    Patient Stated Goals Pt's goal is to ambulate without an AD, reduce her pain, and return to recereational activities ( Bocce, pickle ball, and horseshoes)    Currently in Pain? No/denies   Pain Descriptors / Indicators --  stiffness   Pain Type Surgical pain   Pain Radiating Towards None   Pain Onset More than a month ago   Pain Frequency Intermittent   Aggravating Factors  L knee flexion   Pain Relieving Factors rest, ice, pain medication   Effect of Pain on Daily Activities Difficulty with gait, and increased time for ADL's             [facility] Adult PT Treatment/Exercise - 02/09/16 0001    Knee/Hip Exercises: Stretches   Active Hamstring Stretch Left;3 reps;30 seconds   Active Hamstring Stretch Limitations standing 12in step   Quad Stretch Left;3 reps;30 seconds   Quad Stretch Limitations in [place] position with strap    Knee: Self-Stretch to increase Flexion Left   Knee: Self-Stretch Limitations 10 reps on 12 in step   [employer]  Stretch Both;3 reps;30 seconds   Gastroc Stretch Limitations slant board   Knee/Hip Exercises: Aerobic   Recumbent Bike full revolution for ROM at seat 18  x 5 min initial   Knee/Hip Exercises: Standing   Terminal Knee Extension Limitations 15x 5" with GTB   Knee/Hip Exercises: Supine   Quad Sets Left;1 set;15 reps   Quad Sets Limitations 5 " holds   Heel Slides AAROM;15 reps   Heel Slides Limitations 5-102   Knee Extension PROM   Knee Extension Limitations 4 degrees   Knee Flexion AAROM   Knee Flexion Limitations 105 degrees   Manual Therapy   Manual Therapy Myofascial release;Passive ROM   Manual therapy comments Performed separatelty from all other interventions at end of session   Myofascial Release scar tissue and perimeter of knee   Passive ROM L knee PROM into flexion/extension x 10 reps                    PT Short Term Goals - 01/31/16 0947    PT SHORT TERM GOAL #1   Title Patient will independently verbalzie and demo initial TKA HEP in order to improve compliance and progression towards goals.    Time 3   Period Weeks   Status Achieved   PT SHORT TERM GOAL #2   Title Patient will independently verbalize pain management strategies including use of cryotherapy in order to manage pain levels with ADLs and functional mobility activities.   Time 2   Period Weeks   Status Achieved   PT SHORT TERM GOAL #3   Title Patient will report reduced max L knee pain to 5/10 on a VAS in order to improve tolerance with ther ex and indoor ambulation.   Time 4   Period Weeks   Status Achieved   PT SHORT TERM GOAL #4   Title Patient will demo improved L knee AAROM to 5-100 degrees in order to improve L knee mobility and ROM with ambulation and transfers.   Time 4   Period Weeks   Status On-going   PT SHORT TERM GOAL #5   Title Patient will be able to complete all transfers independently with improved safety awareness in order to return to her PLOF and reduce the risk for falls.   Time 3   Period Weeks   Status Achieved           PT Long Term Goals - 01/31/16 0947    PT LONG TERM GOAL #1   Title Patient will demo improved L knee AAROM to 0-115 degrees in order to improve L knee mobility and ROM with stair climbing and leisure activities.     Time 7   Period Weeks   Status On-going   PT LONG TERM GOAL #2   Title Patient will demo improved L quad and HS strength to >4/5 MMT in order to improve performance with outdoor ambulation and transfers.   Time 7   Period Weeks   Status Achieved   PT LONG TERM GOAL #3   Title TUG time will improve to <14 seconds with the use of a SPC in order to reduce the risk for falls.     Time 7   Period Weeks   Status Achieved   PT LONG TERM GOAL #4   Title Patient will independently ambulate with a SPC with good heel to toe sequence on  level terrain for >600' in order to progress towards becoming a community ambulator.  Period Weeks   Status On-going   PT LONG TERM GOAL #5   Title Patient will improve her FOTO limitation to <50% in order to progress towards her PLOF with recreational activities.    Time 7   Period Weeks   Status Achieved   PT LONG TERM GOAL #6   Title Patient will report decreased L knee pain to 0-5/10 on a VAS in order to improve quality of life and to be able to complete standing activities for >30 minutes.   Time 7   Period Weeks   Status Achieved               Plan - 02/09/16 1211    Clinical Impression Statement Session focus on improving knee mobilty to reduce stiffness and improve ROM.  Pt 10 minutes late for session, unable to complete full POC.  Contnued therex focus with mobility exercises and stretces.  Able to complete full revoulation on seat 18 this session (wes 19 last session).  Myofascial technqiues complete to reduce adhesions on lateral inferior aspect of knee.  No reports of pain through session.  AROM 5-102 degress, good tolerance with PROM to 105 degrees flexion.   Rehab Potential Good   Clinical Impairments Affecting Rehab Potential Surgical L knee pain    PT Frequency 2x / week   PT Duration 4 weeks   PT Treatment/Interventions ADLs/Self Care Home Management;Cryotherapy;Electrical Stimulation;Moist Heat;Therapeutic exercise;Balance training;Therapeutic activities;Functional mobility training;Stair training;Gait training;DME Instruction;Neuromuscular re-education;Patient/family education;Manual techniques;Taping;Passive range of motion   PT Next Visit Plan Continue to progress Lt knee stability to improve balance and overall ROM.       Patient will benefit from skilled therapeutic intervention in order to improve the following deficits and impairments:  Abnormal gait, Decreased range of motion, Difficulty walking, Decreased safety awareness, Decreased endurance, Decreased  activity tolerance, Pain, Impaired flexibility, Hypomobility, Decreased balance, Decreased mobility, Decreased strength, Increased edema, Postural dysfunction  Visit Diagnosis: Stiffness of left knee, not elsewhere classified  Muscle weakness (generalized)  Other abnormalities of gait and mobility  Unsteadiness on feet  Pain in left knee     Problem List Patient Active Problem List   Diagnosis Date Noted  . OA (osteoarthritis) of knee 12/27/2015  . Screening for osteoporosis 02/25/2014  . 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 Allergic reaction 05/13/2013  . Hyperlipemia 04/24/2013  . Hypertension 04/24/2013   Ihor Austin, [place]; [place]   Aldona Lento 02/09/2016, 12:17 PM  [facility] [place] [facility] [address] [place], [place], [address] Phone: [phone]   Fax:  [phone]  Name: VERNISHA KAGE MRN: [id number] Date of Birth: June 30, 1941

## 2016-02-11 ENCOUNTER — Encounter (HOSPITAL_COMMUNITY): Payer: Medicare Other | Admitting: Physical Therapy

## 2016-02-14 ENCOUNTER — Ambulatory Visit (HOSPITAL_COMMUNITY): Payer: Medicare Other | Admitting: Physical Therapy

## 2016-02-14 DIAGNOSIS — R2689 Other abnormalities of gait and mobility: Secondary | ICD-10-CM

## 2016-02-14 DIAGNOSIS — Z96652 Presence of left artificial knee joint: Secondary | ICD-10-CM | POA: Diagnosis not present

## 2016-02-14 DIAGNOSIS — M6281 Muscle weakness (generalized): Secondary | ICD-10-CM | POA: Diagnosis not present

## 2016-02-14 DIAGNOSIS — M25662 Stiffness of left knee, not elsewhere classified: Secondary | ICD-10-CM

## 2016-02-14 DIAGNOSIS — R269 Unspecified abnormalities of gait and mobility: Secondary | ICD-10-CM | POA: Diagnosis not present

## 2016-02-14 DIAGNOSIS — R2681 Unsteadiness on feet: Secondary | ICD-10-CM | POA: Diagnosis not present

## 2016-02-14 DIAGNOSIS — M25562 Pain in left knee: Secondary | ICD-10-CM

## 2016-02-14 NOTE — Therapy (Signed)
[place], [place], [address] Phone: [phone]   Fax:  [phone]  Physical Therapy Treatment  Patient Details  Name: [name] MRN: [id number] Date of Birth: 1941/08/05 Referring Provider: Dr. Hector Shade   Encounter Date: 02/14/2016      PT End of Session - 02/14/16 1136    Visit Number 15   Number of Visits 22   Date for PT Re-Evaluation 03/01/16   Authorization Type [employer] Medicare    Authorization Time Period 01/10/2016 to 02/28/2016    Authorization - Visit Number 15   Authorization - Number of Visits 20   PT Start Time 0946   PT Stop Time 1030   PT Time Calculation (min) 44 min   Activity Tolerance Patient tolerated treatment well   Behavior During Therapy [facility] for tasks assessed/performed      Past Medical History  Diagnosis Date  . Hypertension   . Arthritis   . Allergy   . Hyperlipidemia   . Angioedema   . Hx of echocardiogram 10/2005    normal  . GERD (gastroesophageal reflux disease)   . Headache   . Thyroid disease     treated with radiation in 1980s     Past Surgical History  Procedure Laterality Date  . Abdominal hysterectomy    . Joint replacement    . Tubal ligation    . Colonoscopy  06/15/2011    Procedure: COLONOSCOPY;  Surgeon: Rogene Houston, MD;  [facility];  Service: Endoscopy;  Laterality: N/A;  . Replacement total knee Right 2000  . Cholecystectomy    . Eye surgery      removed cyst from right eye in june 2015. sept 9th surgery for eye lid drop on both eyes  . 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      Subjective Assessment - 02/14/16 0950    Subjective Pt states she is a little stiff today. Says she was sitting at church all day yesterday and the rain today is making  it more stiff than usual. No other commplaints   Pertinent History HTN, hyperlipdemia, and R TKA    Diagnostic tests N/A    Patient Stated Goals Pt's goal is to ambulate without an AD, reduce her pain, and return to recereational activities ( Bocce, pickle ball, and horseshoes)    Currently in Pain? No/denies            [facility] PT Assessment - 02/14/16 0001    PROM   Left Knee Extension 10   Left Knee Flexion 95                     [facility] Adult PT Treatment/Exercise - 02/14/16 0001    Knee/Hip Exercises: Aerobic   Recumbent Bike full revolution for ROM at seat 15  x 5 min initial   Knee/Hip Exercises: Standing   Wall Squat 2 sets;10 reps   Wall Squat Limitations Rt weight shift   Knee/Hip Exercises: Supine   Bridges Both;2 sets;10 reps   Manual Therapy   Manual Therapy Myofascial release;Joint mobilization   Manual therapy comments Performed separatelty from all other interventions at end of session   Joint Mobilization Grade I-IV patellar mobs S<>I;  and proximal/distal fibular mobs  Soft tissue mobilization Lt peroneal longus                PT Education - 02/14/16 1135    Education provided Yes   Education Details updated HEP; implications of manual treatment; importance of equal weight bearing during sit/stand, squat activity   Person(s) Educated Patient   Methods Explanation;Demonstration;Handout;Verbal cues   Comprehension Verbalized understanding;Returned demonstration;Need further instruction          PT Short Term Goals - 01/31/16 0947    PT SHORT TERM GOAL #1   Title Patient will independently verbalzie and demo initial TKA HEP in order to improve compliance and progression towards goals.    Time 3   Period Weeks   Status Achieved   PT SHORT TERM GOAL #2   Title Patient will independently verbalize pain management strategies including use of cryotherapy in order to manage pain levels with ADLs and functional mobility activities.   Time 2    Period Weeks   Status Achieved   PT SHORT TERM GOAL #3   Title Patient will report reduced max L knee pain to 5/10 on a VAS in order to improve tolerance with ther ex and indoor ambulation.   Time 4   Period Weeks   Status Achieved   PT SHORT TERM GOAL #4   Title Patient will demo improved L knee AAROM to 5-100 degrees in order to improve L knee mobility and ROM with ambulation and transfers.   Time 4   Period Weeks   Status On-going   PT SHORT TERM GOAL #5   Title Patient will be able to complete all transfers independently with improved safety awareness in order to return to her PLOF and reduce the risk for falls.   Time 3   Period Weeks   Status Achieved           PT Long Term Goals - 01/31/16 0947    PT LONG TERM GOAL #1   Title Patient will demo improved L knee AAROM to 0-115 degrees in order to improve L knee mobility and ROM with stair climbing and leisure activities.     Time 7   Period Weeks   Status On-going   PT LONG TERM GOAL #2   Title Patient will demo improved L quad and HS strength to >4/5 MMT in order to improve performance with outdoor ambulation and transfers.   Time 7   Period Weeks   Status Achieved   PT LONG TERM GOAL #3   Title TUG time will improve to <14 seconds with the use of a SPC in order to reduce the risk for falls.     Time 7   Period Weeks   Status Achieved   PT LONG TERM GOAL #4   Title Patient will independently ambulate with a SPC with good heel to toe sequence on level terrain for >600' in order to progress towards becoming a community ambulator.     Period Weeks   Status On-going   PT LONG TERM GOAL #5   Title Patient will improve her FOTO limitation to <50% in order to progress towards her PLOF with recreational activities.    Time 7   Period Weeks   Status Achieved   PT LONG TERM GOAL #6   Title Patient will report decreased L knee pain to 0-5/10 on a VAS in order to improve quality of life and to be able to complete standing  activities for >30 minutes.   Time  7   Period Weeks   Status Achieved               Plan - 02/14/16 1137    Clinical Impression Statement Today's session focused on LE therex and manual treatment to improve Lt knee stiffness and strength. Pt demonstrating significant weight shift to the Rt during squat and sit to stand activity which therapist provided visual and tactile cues to correct. Pt will need further feedback concerning this. Pt also demonstrating limited proximal fibular mobility which improved after manual treatment with decreased report of knee stiffness. Will continue with current POC.   Rehab Potential Good   Clinical Impairments Affecting Rehab Potential --   PT Frequency 2x / week   PT Duration 4 weeks   PT Treatment/Interventions ADLs/Self Care Home Management;Cryotherapy;Electrical Stimulation;Moist Heat;Therapeutic exercise;Balance training;Therapeutic activities;Functional mobility training;Stair training;Gait training;DME Instruction;Neuromuscular re-education;Patient/family education;Manual techniques;Taping;Passive range of motion   PT Next Visit Plan address fibular stiffness; functional LE strength and equal weight shift during squat/sit to stand activity.    PT Home Exercise Plan updated with bridge   Recommended Other Services none   Consulted and Agree with Plan of Care Patient      Patient will benefit from skilled therapeutic intervention in order to improve the following deficits and impairments:  Abnormal gait, Decreased range of motion, Difficulty walking, Decreased safety awareness, Decreased endurance, Decreased activity tolerance, Pain, Impaired flexibility, Hypomobility, Decreased balance, Decreased mobility, Decreased strength, Increased edema, Postural dysfunction  Visit Diagnosis: Stiffness of left knee, not elsewhere classified  Muscle weakness (generalized)  Other abnormalities of gait and mobility  Unsteadiness on feet  Pain in left  knee     Problem List Patient Active Problem List   Diagnosis Date Noted  . OA (osteoarthritis) of knee 12/27/2015  . Screening for osteoporosis 02/25/2014  . 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 Allergic reaction 05/13/2013  . Hyperlipemia 04/24/2013  . Hypertension 04/24/2013    11:47 AM,02/14/2016 Elly Modena PT, DPT [facility] [place], [place], [address] Phone: [phone]   Fax:  [phone]  Name: [name] MRN: [id number] Date of Birth: Feb 11, 1941

## 2016-02-16 ENCOUNTER — Ambulatory Visit (HOSPITAL_COMMUNITY): Payer: Medicare Other | Admitting: Physical Therapy

## 2016-02-16 DIAGNOSIS — R2689 Other abnormalities of gait and mobility: Secondary | ICD-10-CM | POA: Diagnosis not present

## 2016-02-16 DIAGNOSIS — M25662 Stiffness of left knee, not elsewhere classified: Secondary | ICD-10-CM | POA: Diagnosis not present

## 2016-02-16 DIAGNOSIS — R2681 Unsteadiness on feet: Secondary | ICD-10-CM

## 2016-02-16 DIAGNOSIS — R269 Unspecified abnormalities of gait and mobility: Secondary | ICD-10-CM | POA: Diagnosis not present

## 2016-02-16 DIAGNOSIS — M6281 Muscle weakness (generalized): Secondary | ICD-10-CM | POA: Diagnosis not present

## 2016-02-16 DIAGNOSIS — M25562 Pain in left knee: Secondary | ICD-10-CM

## 2016-02-16 DIAGNOSIS — Z96652 Presence of left artificial knee joint: Secondary | ICD-10-CM | POA: Diagnosis not present

## 2016-02-16 NOTE — Therapy (Signed)
[place], [place], [address] Phone: [phone]   Fax:  [phone]  Physical Therapy Treatment  Patient Details  Name: [name] MRN: [id number] Date of Birth: [birth year] Referring Provider: Dr. Hector Shade   Encounter Date: 02/16/2016      PT End of Session - 02/16/16 1031    Visit Number 16   Number of Visits 22   Date for PT Re-Evaluation 03/01/16   Authorization Type [employer] Medicare    Authorization Time Period 01/10/2016 to 02/28/2016    Authorization - Visit Number 16   Authorization - Number of Visits 20   PT Start Time 0945   PT Stop Time 1034   PT Time Calculation (min) 49 min   Activity Tolerance Patient tolerated treatment well   Behavior During Therapy [facility] for tasks assessed/performed      Past Medical History  Diagnosis Date  . Hypertension   . Arthritis   . Allergy   . Hyperlipidemia   . Angioedema   . Hx of echocardiogram 10/2005    normal  . GERD (gastroesophageal reflux disease)   . Headache   . Thyroid disease     treated with radiation in 1980s     Past Surgical History  Procedure Laterality Date  . Abdominal hysterectomy    . Joint replacement    . Tubal ligation    . Colonoscopy  06/15/2011    Procedure: COLONOSCOPY;  Surgeon: Rogene Houston, MD;  [facility];  Service: Endoscopy;  Laterality: N/A;  . Replacement total knee Right 2000  . Cholecystectomy    . Eye surgery      removed cyst from right eye in june 2015. sept 9th surgery for eye lid drop on both eyes  . 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      Subjective Assessment - 02/16/16 0956    Subjective Pt states she is stiff all over this morning form arthritis.  No pain reported in her Lt knee this morning.    Currently in Pain? No/denies                         [facility] Adult PT Treatment/Exercise - 02/16/16 0956    Knee/Hip Exercises: Stretches   Active Hamstring Stretch Left;3 reps;30 seconds   Active Hamstring Stretch Limitations standing 12in step   Knee: Self-Stretch to increase Flexion Left   Knee: Self-Stretch Limitations 10 reps on 12 in step   Gastroc Stretch Both;3 reps;30 seconds   Gastroc Stretch Limitations slant board   Knee/Hip Exercises: Aerobic   Recumbent Bike full revolution for ROM at seat 15  x 5 min initial   Knee/Hip Exercises: Supine   Quad Sets Left;1 set;15 reps   Quad Sets Limitations 5" holds   Heel Slides AAROM;15 reps   Bridges Both;2 sets;10 reps   Straight Leg Raises Left;15 reps   Straight Leg Raises Limitations verbal cues to maintain L TKE    Knee Extension PROM   Knee Extension Limitations 4 degrees   Knee Flexion AAROM   Knee Flexion Limitations 102 degrees   Manual Therapy   Manual Therapy Myofascial release;Joint mobilization   Manual therapy comments Performed separatelty from all other interventions at end of session  Joint Mobilization Grade I-IV patellar mobs S<>I;  and proximal/distal fibular mobs   Soft tissue mobilization Lt peroneal longus   Myofascial Release scar tissue and perimeter of knee   Passive ROM L knee PROM into flexion/extension x 10 reps                   PT Short Term Goals - 01/31/16 0947    PT SHORT TERM GOAL #1   Title Patient will independently verbalzie and demo initial TKA HEP in order to improve compliance and progression towards goals.    Time 3   Period Weeks   Status Achieved   PT SHORT TERM GOAL #2   Title Patient will independently verbalize pain management strategies including use of cryotherapy in order to manage pain levels with ADLs and functional mobility activities.   Time 2   Period Weeks   Status Achieved   PT SHORT TERM GOAL #3   Title Patient will report reduced max L  knee pain to 5/10 on a VAS in order to improve tolerance with ther ex and indoor ambulation.   Time 4   Period Weeks   Status Achieved   PT SHORT TERM GOAL #4   Title Patient will demo improved L knee AAROM to 5-100 degrees in order to improve L knee mobility and ROM with ambulation and transfers.   Time 4   Period Weeks   Status On-going   PT SHORT TERM GOAL #5   Title Patient will be able to complete all transfers independently with improved safety awareness in order to return to her PLOF and reduce the risk for falls.   Time 3   Period Weeks   Status Achieved           PT Long Term Goals - 01/31/16 0947    PT LONG TERM GOAL #1   Title Patient will demo improved L knee AAROM to 0-115 degrees in order to improve L knee mobility and ROM with stair climbing and leisure activities.     Time 7   Period Weeks   Status On-going   PT LONG TERM GOAL #2   Title Patient will demo improved L quad and HS strength to >4/5 MMT in order to improve performance with outdoor ambulation and transfers.   Time 7   Period Weeks   Status Achieved   PT LONG TERM GOAL #3   Title TUG time will improve to <14 seconds with the use of a SPC in order to reduce the risk for falls.     Time 7   Period Weeks   Status Achieved   PT LONG TERM GOAL #4   Title Patient will independently ambulate with a SPC with good heel to toe sequence on level terrain for >600' in order to progress towards becoming a community ambulator.     Period Weeks   Status On-going   PT LONG TERM GOAL #5   Title Patient will improve her FOTO limitation to <50% in order to progress towards her PLOF with recreational activities.    Time 7   Period Weeks   Status Achieved   PT LONG TERM GOAL #6   Title Patient will report decreased L knee pain to 0-5/10 on a VAS in order to improve quality of life and to be able to complete standing activities for >30 minutes.   Time 7   Period Weeks   Status Achieved  Plan  - 02/16/16 1034    Clinical Impression Statement continued focus on improving ROM and decreasing scar tissue and general tightness perimeter of Lt  knee. Main complaint is discomfort down lateral LE in peroneal area.  Instructed to complete self massage to this area and continue her HEP for ROM.  Most tightness noted in the area and superior medial region of knee with ROM.     Rehab Potential Good   PT Frequency 2x / week   PT Duration 4 weeks   PT Treatment/Interventions ADLs/Self Care Home Management;Cryotherapy;Electrical Stimulation;Moist Heat;Therapeutic exercise;Balance training;Therapeutic activities;Functional mobility training;Stair training;Gait training;DME Instruction;Neuromuscular re-education;Patient/family education;Manual techniques;Taping;Passive range of motion   PT Next Visit Plan address fibular stiffness; functional LE strength and equal weight shift during squat/sit to stand activity.    PT Home Exercise Plan --   Consulted and Agree with Plan of Care Patient      Patient will benefit from skilled therapeutic intervention in order to improve the following deficits and impairments:  Abnormal gait, Decreased range of motion, Difficulty walking, Decreased safety awareness, Decreased endurance, Decreased activity tolerance, Pain, Impaired flexibility, Hypomobility, Decreased balance, Decreased mobility, Decreased strength, Increased edema, Postural dysfunction  Visit Diagnosis: Stiffness of left knee, not elsewhere classified  Muscle weakness (generalized)  Other abnormalities of gait and mobility  Unsteadiness on feet  Pain in left knee     Problem List Patient Active Problem List   Diagnosis Date Noted  . OA (osteoarthritis) of knee 12/27/2015  . Screening for osteoporosis 02/25/2014  . 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 Allergic reaction 05/13/2013  . Hyperlipemia 04/24/2013  . Hypertension 04/24/2013    Teena Irani, PTA/CLT [phone]  02/16/2016, 10:38 AM  [place], [place], [address] Phone: [phone]   Fax:  [phone]  Name: VERDIS GRANAT MRN: [id number] Date of Birth: [birth year]

## 2016-02-18 ENCOUNTER — Encounter (HOSPITAL_COMMUNITY): Payer: Medicare Other | Admitting: Physical Therapy

## 2016-02-21 ENCOUNTER — Ambulatory Visit (HOSPITAL_COMMUNITY): Payer: Medicare Other | Attending: Orthopedic Surgery | Admitting: Physical Therapy

## 2016-02-21 ENCOUNTER — Encounter: Payer: Medicare Other | Admitting: Nurse Practitioner

## 2016-02-21 DIAGNOSIS — M6281 Muscle weakness (generalized): Secondary | ICD-10-CM | POA: Insufficient documentation

## 2016-02-21 DIAGNOSIS — M25562 Pain in left knee: Secondary | ICD-10-CM

## 2016-02-21 DIAGNOSIS — R2681 Unsteadiness on feet: Secondary | ICD-10-CM | POA: Diagnosis not present

## 2016-02-21 DIAGNOSIS — R2689 Other abnormalities of gait and mobility: Secondary | ICD-10-CM | POA: Diagnosis not present

## 2016-02-21 DIAGNOSIS — M25662 Stiffness of left knee, not elsewhere classified: Secondary | ICD-10-CM | POA: Diagnosis not present

## 2016-02-21 NOTE — Therapy (Signed)
[place], [place], [address] Phone: [phone]   Fax:  [phone]  Physical Therapy Treatment  Patient Details  Name: [name] MRN: [id number] Date of Birth: 13-Aug-1941 Referring Provider: Dr. Hector Shade   Encounter Date: 02/21/2016      PT End of Session - 02/21/16 1059    Visit Number 17   Number of Visits 22   Date for PT Re-Evaluation 03/01/16   Authorization Type [employer] Medicare    Authorization Time Period 01/10/2016 to 02/28/2016    Authorization - Visit Number 46   Authorization - Number of Visits 20   PT Start Time [id number]  pt was late   PT Stop Time 1033   PT Time Calculation (min) 38 min   Activity Tolerance Patient tolerated treatment well   Behavior During Therapy [facility] for tasks assessed/performed      Past Medical History  Diagnosis Date  . Hypertension   . Arthritis   . Allergy   . Hyperlipidemia   . Angioedema   . Hx of echocardiogram 10/2005    normal  . GERD (gastroesophageal reflux disease)   . Headache   . Thyroid disease     treated with radiation in 1980s     Past Surgical History  Procedure Laterality Date  . Abdominal hysterectomy    . Joint replacement    . Tubal ligation    . Colonoscopy  06/15/2011    Procedure: COLONOSCOPY;  Surgeon: Rogene Houston, MD;  [facility];  Service: Endoscopy;  Laterality: N/A;  . Replacement total knee Right 2000  . Cholecystectomy    . Eye surgery      removed cyst from right eye in june 2015. sept 9th surgery for eye lid drop on both eyes  . 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      Subjective Assessment - 02/21/16 1013    Subjective Pt states she has a catch in her Rt hip from working in her yard transplanting flowers and laying  mulch.  No pain or soreness in her Lt knee however.   Currently in Pain? No/denies                         [facility] Adult PT Treatment/Exercise - 02/21/16 1024    Knee/Hip Exercises: Stretches   Active Hamstring Stretch Left;3 reps;30 seconds   Active Hamstring Stretch Limitations standing 12in step   Knee: Self-Stretch to increase Flexion Left   Knee: Self-Stretch Limitations 10 reps on 12 in step   Gastroc Stretch Both;3 reps;30 seconds   Gastroc Stretch Limitations slant board   Knee/Hip Exercises: Aerobic   Recumbent Bike full revolution for ROM at seat 15  x 5 min initial   Knee/Hip Exercises: Supine   Quad Sets Left;1 set;15 reps   Heel Slides AAROM;15 reps   Heel Slides Limitations 4-100   Straight Leg Raises Left;15 reps   Straight Leg Raises Limitations verbal cues to maintain L TKE    Knee Extension PROM   Knee Extension Limitations 2 degrees   Knee Flexion AAROM   Knee Flexion Limitations 105 degrees   Manual Therapy   Manual Therapy Myofascial release;Joint mobilization   Manual therapy comments Performed separatelty from all  other interventions at end of session   Soft tissue mobilization Lt peroneal longus   Myofascial Release scar tissue and perimeter of knee.  friction massage inferior knee over patellar tendon   Passive ROM L knee PROM into flexion/extension x 10 reps                   PT Short Term Goals - 01/31/16 0947    PT SHORT TERM GOAL #1   Title Patient will independently verbalzie and demo initial TKA HEP in order to improve compliance and progression towards goals.    Time 3   Period Weeks   Status Achieved   PT SHORT TERM GOAL #2   Title Patient will independently verbalize pain management strategies including use of cryotherapy in order to manage pain levels with ADLs and functional mobility activities.   Time 2   Period Weeks   Status Achieved   PT SHORT TERM GOAL #3   Title Patient will report reduced max L knee pain  to 5/10 on a VAS in order to improve tolerance with ther ex and indoor ambulation.   Time 4   Period Weeks   Status Achieved   PT SHORT TERM GOAL #4   Title Patient will demo improved L knee AAROM to 5-100 degrees in order to improve L knee mobility and ROM with ambulation and transfers.   Time 4   Period Weeks   Status On-going   PT SHORT TERM GOAL #5   Title Patient will be able to complete all transfers independently with improved safety awareness in order to return to her PLOF and reduce the risk for falls.   Time 3   Period Weeks   Status Achieved           PT Long Term Goals - 01/31/16 0947    PT LONG TERM GOAL #1   Title Patient will demo improved L knee AAROM to 0-115 degrees in order to improve L knee mobility and ROM with stair climbing and leisure activities.     Time 7   Period Weeks   Status On-going   PT LONG TERM GOAL #2   Title Patient will demo improved L quad and HS strength to >4/5 MMT in order to improve performance with outdoor ambulation and transfers.   Time 7   Period Weeks   Status Achieved   PT LONG TERM GOAL #3   Title TUG time will improve to <14 seconds with the use of a SPC in order to reduce the risk for falls.     Time 7   Period Weeks   Status Achieved   PT LONG TERM GOAL #4   Title Patient will independently ambulate with a SPC with good heel to toe sequence on level terrain for >600' in order to progress towards becoming a community ambulator.     Period Weeks   Status On-going   PT LONG TERM GOAL #5   Title Patient will improve her FOTO limitation to <50% in order to progress towards her PLOF with recreational activities.    Time 7   Period Weeks   Status Achieved   PT LONG TERM GOAL #6   Title Patient will report decreased L knee pain to 0-5/10 on a VAS in order to improve quality of life and to be able to complete standing activities for >30 minutes.   Time 7   Period Weeks   Status Achieved  Plan -  02/21/16 1059    Clinical Impression Statement Pt was 10 minutes late for scheduled appt.  Reports continued self massage and compliance with HEP.  focuses on manual techniques and ROM to continue to progress towards primary goal of return to normal ROM.  Able to achieve AROM: 4-100 degrees and PROM of 2-105 degrees today following manual .    Rehab Potential Good   PT Frequency 2x / week   PT Duration 4 weeks   PT Treatment/Interventions ADLs/Self Care Home Management;Cryotherapy;Electrical Stimulation;Moist Heat;Therapeutic exercise;Balance training;Therapeutic activities;Functional mobility training;Stair training;Gait training;DME Instruction;Neuromuscular re-education;Patient/family education;Manual techniques;Taping;Passive range of motion   PT Next Visit Plan address fibular stiffness; functional LE strength and equal weight shift during squat/sit to stand activity.    Consulted and Agree with Plan of Care Patient      Patient will benefit from skilled therapeutic intervention in order to improve the following deficits and impairments:  Abnormal gait, Decreased range of motion, Difficulty walking, Decreased safety awareness, Decreased endurance, Decreased activity tolerance, Pain, Impaired flexibility, Hypomobility, Decreased balance, Decreased mobility, Decreased strength, Increased edema, Postural dysfunction  Visit Diagnosis: Stiffness of left knee, not elsewhere classified  Muscle weakness (generalized)  Other abnormalities of gait and mobility  Unsteadiness on feet  Pain in left knee     Problem List Patient Active Problem List   Diagnosis Date Noted  . OA (osteoarthritis) of knee 12/27/2015  . Screening for osteoporosis 02/25/2014  . 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 Allergic reaction 05/13/2013  . Hyperlipemia 04/24/2013  . Hypertension 04/24/2013    Teena Irani, PTA/CLT [phone]  02/21/2016, 11:06 AM  [place], [place], [address] Phone: [phone]   Fax:  [phone]  Name: [name] MRN: [id number] Date of Birth: Nov 30, 1940

## 2016-02-22 ENCOUNTER — Encounter: Payer: Medicare Other | Admitting: Nurse Practitioner

## 2016-02-23 ENCOUNTER — Ambulatory Visit (HOSPITAL_COMMUNITY): Payer: Medicare Other | Admitting: Physical Therapy

## 2016-02-23 DIAGNOSIS — M25562 Pain in left knee: Secondary | ICD-10-CM

## 2016-02-23 DIAGNOSIS — R2689 Other abnormalities of gait and mobility: Secondary | ICD-10-CM

## 2016-02-23 DIAGNOSIS — M25662 Stiffness of left knee, not elsewhere classified: Secondary | ICD-10-CM | POA: Diagnosis not present

## 2016-02-23 DIAGNOSIS — R2681 Unsteadiness on feet: Secondary | ICD-10-CM | POA: Diagnosis not present

## 2016-02-23 DIAGNOSIS — M6281 Muscle weakness (generalized): Secondary | ICD-10-CM

## 2016-02-23 NOTE — Therapy (Signed)
[place], [place], [address] Phone: [phone]   Fax:  [phone]  Physical Therapy Treatment (Discharge)  Patient Details  Name: [name] MRN: [id number] Date of Birth: [birth year] Referring Provider: Dr. Hector Shade   Encounter Date: 02/23/2016      PT End of Session - 02/23/16 1130    Visit Number 18   Number of Visits 18   Authorization Type [employer]    Authorization Time Period 01/10/2016 to 02/28/2016    Authorization - Visit Number 18   Authorization - Number of Visits 20   PT Start Time 0948   PT Stop Time 1031   PT Time Calculation (min) 43 min   Activity Tolerance Patient tolerated treatment well   Behavior During Therapy [facility] for tasks assessed/performed      Past Medical History  Diagnosis Date  . Hypertension   . Arthritis   . Allergy   . Hyperlipidemia   . Angioedema   . Hx of echocardiogram 10/2005    normal  . GERD (gastroesophageal reflux disease)   . Headache   . Thyroid disease     treated with radiation in 1980s     Past Surgical History  Procedure Laterality Date  . Abdominal hysterectomy    . Joint replacement    . Tubal ligation    . Colonoscopy  06/15/2011    Procedure: COLONOSCOPY;  Surgeon: Rogene Houston, MD;  Location: [facility];  Service: Endoscopy;  Laterality: N/A;  . Replacement total knee Right 2000  . Cholecystectomy    . Eye surgery      removed cyst from right eye in june 2015. sept 9th surgery for eye lid drop on both eyes  . 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      Subjective Assessment - 02/23/16 0950    Subjective Patient reports that her last remainig issue is just stiffness in her left knee; she really does not have pain unless she has pushed too hard  durign the day. She still has a hard tiem bending her knee. No falls or close calls recently. She reports walking has become much easier, as have stairs.    Patient is accompained by: Family member   Pertinent History HTN, hyperlipdemia, and R TKA    How long can you sit comfortably? 5/3- 2 hours    How long can you stand comfortably? 5/3- 45-60 minutes    How long can you walk comfortably? 5/3- 60 minutes with no device    Patient Stated Goals Pt's goal is to ambulate without an AD, reduce her pain, and return to recereational activities ( Bocce, pickle ball, and horseshoes)    Currently in Pain? No/denies            [facility] PT Assessment - 02/23/16 0001    Observation/Other Assessments   Focus on Therapeutic Outcomes (FOTO)  20% limited    AROM   Left Knee Extension 13   Left Knee Flexion 100   Strength   Right Hip Flexion 4/5   Right Hip Extension 4+/5   Right Hip ABduction 5/5   Left Hip Flexion 5/5   Left Hip Extension 4/5   Left Hip ABduction 4/5   Right Knee Flexion 5/5   Right  Knee Extension 5/5   Left Knee Flexion 4+/5   Left Knee Extension 4+/5   Right Ankle Dorsiflexion 5/5   Left Ankle Dorsiflexion 4+/5   6 minute walk test results    Aerobic Endurance Distance Walked 565   Endurance additional comments 3MWT, no device no breaks    Timed Up and Go Test   Normal TUG (seconds) 9.75   TUG Comments no device                              PT Education - 02/23/16 1128    Education provided Yes   Education Details progress with skilled PT services, last remaining deficit is significant ROM limitation, options including continued skilled PT services, possible JAS brace, advanced HEP    Person(s) Educated Patient   Methods Explanation;Demonstration;Handout   Comprehension Verbalized understanding;Returned demonstration          PT Short Term Goals - 02/23/16 1011    PT SHORT TERM GOAL #1   Title Patient will independently verbalzie and demo  initial TKA HEP in order to improve compliance and progression towards goals.    Time 3   Period Weeks   Status Achieved   PT SHORT TERM GOAL #2   Title Patient will independently verbalize pain management strategies including use of cryotherapy in order to manage pain levels with ADLs and functional mobility activities.   Time 2   Period Weeks   Status Achieved   PT SHORT TERM GOAL #3   Title Patient will report reduced max L knee pain to 5/10 on a VAS in order to improve tolerance with ther ex and indoor ambulation.   Baseline 5/3- reports max of 5/10 but this is not common   Time 4   Period Weeks   Status Achieved   PT SHORT TERM GOAL #4   Title Patient will demo improved L knee AAROM to 5-100 degrees in order to improve L knee mobility and ROM with ambulation and transfers.   Baseline 5/3- 13 to 100   Time 4   Period Weeks   Status On-going   PT SHORT TERM GOAL #5   Title Patient will be able to complete all transfers independently with improved safety awareness in order to return to her PLOF and reduce the risk for falls.   Time 3   Period Weeks   Status Achieved           PT Long Term Goals - 02/23/16 1014    PT LONG TERM GOAL #1   Title Patient will demo improved L knee AAROM to 0-115 degrees in order to improve L knee mobility and ROM with stair climbing and leisure activities.     Baseline 5/3- 13-100   Time 7   Period Weeks   Status On-going   PT LONG TERM GOAL #2   Title Patient will demo improved L quad and HS strength to >4/5 MMT in order to improve performance with outdoor ambulation and transfers.   Time 7   Period Weeks   Status Achieved   PT LONG TERM GOAL #3   Title TUG time will improve to <14 seconds with the use of a SPC in order to reduce the risk for falls.     Baseline 5/3- 9.5 no device    Time 7   Period Weeks   Status Achieved   PT LONG TERM GOAL #4   Title Patient will independently  ambulate with a SPC with good heel to toe sequence on  level terrain for >600' in order to progress towards becoming a community ambulator.     Baseline 5/3- has been walking without cane for the most part, but will still sometimes take it with her    Time 7   Period Weeks   Status Achieved   PT LONG TERM GOAL #5   Title Patient will improve her FOTO limitation to <50% in order to progress towards her PLOF with recreational activities.    Time 7   Period Weeks   Status Achieved   PT LONG TERM GOAL #6   Time 7   Period Weeks   Status Achieved               Plan - 03/07/16 1025    Clinical Impression Statement Re-assessment performed today. Patient doing very well overall in all functional tests and measures performed with the great exception of L knee ROM, which remains qutie stiff at 13-100 degrees AROM. Patient was educated extensively on effects of significant knee stiffness and benefits of working to improve it, including possible extended PT or JAS brace, however paitent declined both of these options, stating that she is doing qutie well and her limited ROM is not stopping her from doing anything at this time- she is very pleased with her knee at this point. Did provide patient with additionall ROM exercises for her L knee as advanced HEP. DC today due to high level of function and patient requesting DC at this ponit.    Rehab Potential Good   Clinical Impairments Affecting Rehab Potential Surgical L knee pain    PT Next Visit Plan DC today    PT Home Exercise Plan updated with ROM exercises    Consulted and Agree with Plan of Care Patient      Patient will benefit from skilled therapeutic intervention in order to improve the following deficits and impairments:  Abnormal gait, Decreased range of motion, Difficulty walking, Decreased safety awareness, Decreased endurance, Decreased activity tolerance, Pain, Impaired flexibility, Hypomobility, Decreased balance, Decreased mobility, Decreased strength, Increased edema, Postural  dysfunction  Visit Diagnosis: Stiffness of left knee, not elsewhere classified  Muscle weakness (generalized)  Other abnormalities of gait and mobility  Pain in left knee  Unsteadiness on feet       G-Codes - 07-Mar-2016 1132    Functional Assessment Tool Used FOTO and clincial findings including ROM, MMT, pain, edema, gait deviations, and joint mobility    Functional Limitation Mobility: Walking and moving around   Mobility: Walking and Moving Around Goal Status [phone]) At least 40 percent but less than 60 percent impaired, limited or restricted   Mobility: Walking and Moving Around Discharge Status [phone]) At least 20 percent but less than 40 percent impaired, limited or restricted      Problem List Patient Active Problem List   Diagnosis Date Noted  . OA (osteoarthritis) of knee 12/27/2015  . Screening for osteoporosis 02/25/2014  . 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 Allergic reaction 05/13/2013  . Hyperlipemia 04/24/2013  . Hypertension 04/24/2013   PHYSICAL THERAPY DISCHARGE SUMMARY  Visits from Start of Care: 18  Current functional level related to goals / functional outcomes: Patient reports she is able to do all she wants and needs to do at this point, is concerned about her knee stiffness however declines continued skilled PT services or JAS brace referral, reporting "I think it'll come  along".    Remaining deficits: L knee stiffness    Education / Equipment: Benefits of continued PT and/or JAS brace to aggressively focus on ROM; advanced HEP based in knee mobility and ROM  Plan: Patient agrees to discharge.  Patient goals were partially met. Patient is being discharged due to being pleased with the current functional level.  ?????       Deniece Ree PT, DPT [place] [address] [place], [place], [address] Phone: [phone]   Fax:   [phone]  Name: [name] MRN: [id number] Date of Birth: 01-08-1941

## 2016-02-23 NOTE — Patient Instructions (Signed)
   PRONE KNEE HANGS  While lying down on your stomach, allow your leg to hang off the end of a table/bed. Position yourself so that your knee cap is just over the end of the table/bed.   Just relax your body and allow gravity to stretch your knee into a more straightened position.  Add an ankle weight for increased stretch.  Hold for 5-8 minutes at a time, 1-2 times per day.    Knee Flexion Stretch on Step  Place foot on step and lean forward until you feel a good stretch in front of knee.   Hold for 10 seconds; repeat 10 times, twice a day.    QUAD SET - KNEE EXTENSION STRETCH - SEATED  While sitting, tighten your top thigh muscle to press the back of your knee downward towards the ground.   You should feel a gentle stretch in the back of your knee.  Hold 5 seconds and repeat 10 times, twice a day.    KNEE FLEXION STRETCH - SELF ASSISTED  While seated in a chair, use your right  leg to bend your affected left knee until a stretch is felt.  Hold for 10 seconds and relax. Repeat 10 times, twice a day.

## 2016-02-25 ENCOUNTER — Other Ambulatory Visit: Payer: Self-pay | Admitting: Nurse Practitioner

## 2016-02-25 ENCOUNTER — Encounter: Payer: Self-pay | Admitting: Nurse Practitioner

## 2016-02-25 ENCOUNTER — Ambulatory Visit (INDEPENDENT_AMBULATORY_CARE_PROVIDER_SITE_OTHER): Payer: Medicare Other | Admitting: Nurse Practitioner

## 2016-02-25 VITALS — BP 118/66 | Ht 67.75 in | Wt 191.0 lb

## 2016-02-25 DIAGNOSIS — Z1231 Encounter for screening mammogram for malignant neoplasm of breast: Secondary | ICD-10-CM

## 2016-02-25 DIAGNOSIS — Z0000 Encounter for general adult medical examination without abnormal findings: Secondary | ICD-10-CM

## 2016-02-25 DIAGNOSIS — E785 Hyperlipidemia, unspecified: Secondary | ICD-10-CM | POA: Diagnosis not present

## 2016-02-25 DIAGNOSIS — D62 Acute posthemorrhagic anemia: Secondary | ICD-10-CM | POA: Diagnosis not present

## 2016-02-25 DIAGNOSIS — I10 Essential (primary) hypertension: Secondary | ICD-10-CM | POA: Diagnosis not present

## 2016-02-25 NOTE — Progress Notes (Signed)
Subjective:    Patient ID: [name], female    DOB: [birth year], 75 y.o.   MRN: [id number]  HPI AWV- Annual Wellness Visit  The patient was seen for their annual wellness visit. The patient's past medical history, surgical history, and family history were reviewed. Pertinent vaccines were reviewed ( tetanus, pneumonia, shingles, flu) The patient's medication list was reviewed and updated.  The height and weight were entered. The patient's current BMI is: 29.26  Cognitive screening was completed. Outcome of Mini - Cog: pass  Falls within the past 6 months: one fall. Tripped over some stepping stones in December. Passed fall assessment.   Current tobacco usage: none (All patients who use tobacco were given written and verbal information on quitting)  Recent listing of emergency department/hospitalizations over the past year were reviewed.  current specialist the patient sees on a regular basis:    Medicare annual wellness visit patient questionnaire was reviewed.  A written screening schedule for the patient for the next 5-10 years was given. Appropriate discussion of followup regarding next visit was discussed.  Catch in right side (upper leg) after working in yard 5 days ago. Getting better.  Presents for her preventive health physical. Very, same sexual partner. Regular eye exams. Wears dentures. No oral lesions. No pelvic or abdominal pain. Regular walking program and activity.    Review of Systems  Constitutional: Negative for activity change, appetite change and fatigue.  HENT: Negative for dental problem, ear pain, sinus pressure and sore throat.   Respiratory: Negative for cough, chest tightness, shortness of breath and wheezing.   Cardiovascular: Negative for chest pain.  Gastrointestinal: Negative for nausea, vomiting, abdominal pain, diarrhea, constipation and abdominal distention.  Genitourinary: Negative for dysuria, urgency, frequency, vaginal discharge,  enuresis, difficulty urinating, genital sores and pelvic pain.       Minimal urinary leakage.       Objective:   Physical Exam  Constitutional: She is oriented to person, place, and time. She appears well-developed. No distress.  HENT:  Right Ear: External ear normal.  Left Ear: External ear normal.  Mouth/Throat: Oropharynx is clear and moist.  Neck: Normal range of motion. Neck supple. No tracheal deviation present. No thyromegaly present.  Cardiovascular: Normal rate, regular rhythm and normal heart sounds.  Exam reveals no gallop.   No murmur heard. Pulmonary/Chest: Effort normal and breath sounds normal.  Abdominal: Soft. She exhibits no distension. There is no tenderness.  Genitourinary: Vagina normal. No vaginal discharge found.  External GU: No rashes or lesions. Vagina slightly pale, no discharge. Bimanual exam no tenderness or obvious masses. Rectal exam no masses noted, no stool for Hemoccult.  Musculoskeletal: She exhibits no edema.  Lymphadenopathy:    She has no cervical adenopathy.  Neurological: She is alert and oriented to person, place, and time.  Skin: Skin is warm and dry. No rash noted.  Psychiatric: She has a normal mood and affect. Her behavior is normal.  Vitals reviewed.  Breast exam: No masses; axillae no adenopathy.        Assessment & Plan:  Routine general medical examination at a health care facility - Plan: CBC with Differential/Platelet, Lipid panel, TSH, POC Hemoccult Bld/Stl (3-Cd Home Screen)  Encounter for screening mammogram for breast cancer - Plan: CBC with Differential/Platelet, Lipid panel, TSH, POC Hemoccult Bld/Stl (3-Cd Home Screen), CANCELED: MM DIGITAL SCREENING BILATERAL  Essential hypertension - Plan: CBC with Differential/Platelet, Lipid panel, TSH, POC Hemoccult Bld/Stl (3-Cd Home Screen)  Hyperlipemia - Plan:  CBC with Differential/Platelet, Lipid panel, TSH, POC Hemoccult Bld/Stl (3-Cd Home Screen)  Acute blood loss anemia -  Plan: CBC with Differential/Platelet, Lipid panel, TSH, POC Hemoccult Bld/Stl (3-Cd Home Screen)  Lab work ordered including CBC due to recent mild anemia postop. Encourage regular activity healthy diet and continued efforts at weight loss. Also daily vitamin D and calcium supplementation.

## 2016-03-01 ENCOUNTER — Inpatient Hospital Stay (HOSPITAL_COMMUNITY): Admission: RE | Admit: 2016-03-01 | Payer: Medicare Other | Source: Ambulatory Visit

## 2016-03-23 DIAGNOSIS — D62 Acute posthemorrhagic anemia: Secondary | ICD-10-CM | POA: Diagnosis not present

## 2016-03-23 DIAGNOSIS — Z1231 Encounter for screening mammogram for malignant neoplasm of breast: Secondary | ICD-10-CM | POA: Diagnosis not present

## 2016-03-23 DIAGNOSIS — Z0000 Encounter for general adult medical examination without abnormal findings: Secondary | ICD-10-CM | POA: Diagnosis not present

## 2016-03-23 DIAGNOSIS — E785 Hyperlipidemia, unspecified: Secondary | ICD-10-CM | POA: Diagnosis not present

## 2016-03-23 DIAGNOSIS — I10 Essential (primary) hypertension: Secondary | ICD-10-CM | POA: Diagnosis not present

## 2016-03-24 LAB — CBC WITH DIFFERENTIAL/PLATELET
BASOS ABS: 0 10*3/uL (ref 0.0–0.2)
BASOS: 0 %
EOS (ABSOLUTE): 0.2 10*3/uL (ref 0.0–0.4)
EOS: 4 %
HEMOGLOBIN: 13.2 g/dL (ref 11.1–15.9)
Hematocrit: 39.3 % (ref 34.0–46.6)
IMMATURE GRANS (ABS): 0 10*3/uL (ref 0.0–0.1)
Immature Granulocytes: 0 %
LYMPHS: 23 %
Lymphocytes Absolute: 1.1 10*3/uL (ref 0.7–3.1)
MCH: 29.4 pg (ref 26.6–33.0)
MCHC: 33.6 g/dL (ref 31.5–35.7)
MCV: 88 fL (ref 79–97)
MONOCYTES: 6 %
Monocytes Absolute: 0.3 10*3/uL (ref 0.1–0.9)
NEUTROS ABS: 3.2 10*3/uL (ref 1.4–7.0)
Neutrophils: 67 %
Platelets: 231 10*3/uL (ref 150–379)
RBC: 4.49 x10E6/uL (ref 3.77–5.28)
RDW: 13.8 % (ref 12.3–15.4)
WBC: 4.8 10*3/uL (ref 3.4–10.8)

## 2016-03-24 LAB — LIPID PANEL
CHOLESTEROL TOTAL: 206 mg/dL — AB (ref 100–199)
Chol/HDL Ratio: 5.2 ratio units — ABNORMAL HIGH (ref 0.0–4.4)
HDL: 40 mg/dL (ref >39)
LDL CALC: 142 mg/dL — AB (ref 0–99)
Triglycerides: 120 mg/dL (ref 0–149)
VLDL CHOLESTEROL CAL: 24 mg/dL (ref 5–40)

## 2016-03-24 LAB — TSH: TSH: 3.53 u[IU]/mL (ref 0.450–4.500)

## 2016-04-06 ENCOUNTER — Ambulatory Visit (HOSPITAL_COMMUNITY)
Admission: RE | Admit: 2016-04-06 | Discharge: 2016-04-06 | Disposition: A | Discharge status: Home / Self Care | Payer: Medicare Other | Source: Ambulatory Visit | Attending: Nurse Practitioner | Admitting: Nurse Practitioner

## 2016-04-06 DIAGNOSIS — Z1231 Encounter for screening mammogram for malignant neoplasm of breast: Secondary | ICD-10-CM | POA: Diagnosis not present

## 2016-05-17 ENCOUNTER — Encounter (INDEPENDENT_AMBULATORY_CARE_PROVIDER_SITE_OTHER): Payer: Self-pay | Admitting: *Deleted

## 2016-06-02 ENCOUNTER — Other Ambulatory Visit: Payer: Self-pay | Admitting: Family Medicine

## 2016-06-08 ENCOUNTER — Other Ambulatory Visit (INDEPENDENT_AMBULATORY_CARE_PROVIDER_SITE_OTHER): Payer: Self-pay | Admitting: *Deleted

## 2016-06-08 DIAGNOSIS — Z800 Family history of malignant neoplasm of digestive organs: Secondary | ICD-10-CM

## 2016-06-09 ENCOUNTER — Other Ambulatory Visit (INDEPENDENT_AMBULATORY_CARE_PROVIDER_SITE_OTHER): Payer: Self-pay | Admitting: *Deleted

## 2016-06-09 DIAGNOSIS — Z800 Family history of malignant neoplasm of digestive organs: Secondary | ICD-10-CM

## 2016-06-29 ENCOUNTER — Encounter (INDEPENDENT_AMBULATORY_CARE_PROVIDER_SITE_OTHER): Payer: Self-pay | Admitting: *Deleted

## 2016-06-29 ENCOUNTER — Telehealth (INDEPENDENT_AMBULATORY_CARE_PROVIDER_SITE_OTHER): Payer: Self-pay | Admitting: *Deleted

## 2016-06-29 NOTE — Telephone Encounter (Signed)
Patient needs trilyte 

## 2016-06-29 NOTE — Telephone Encounter (Signed)
Referring MD/PCP: scott luking   Procedure: tcs  Reason/Indication:  famhx colon ca  Has patient had this procedure before?  Yes, 2012  If so, when, by whom and where?    Is there a family history of colon cancer?  Yes, brother  Who?  What age when diagnosed?    Is patient diabetic?   no      Does patient have prosthetic heart valve or mechanical valve?  no  Do you have a pacemaker?  no  Has patient ever had endocarditis? no  Has patient had joint replacement within last 12 months?  Yes, knee replacement 12/2015  Does patient tend to be constipated or take laxatives? Some  Does patient have a history of alcohol/drug use?  no  Is patient on Coumadin, Plavix and/or Aspirin? no  Medications: verapamil 240 mg daily, bisoprol/hctz 2.5/6.25 mg daily, nexium prn, asa prn  Allergies: pcn  Medication Adjustment: asa 2 days  Procedure date & time: 07/27/16 at 230

## 2016-06-30 MED ORDER — PEG 3350-KCL-NA BICARB-NACL 420 G PO SOLR: 4000.0000 mL | Freq: Once | ORAL | 0 refills | Status: AC

## 2016-06-30 NOTE — Telephone Encounter (Signed)
agree

## 2016-07-01 ENCOUNTER — Other Ambulatory Visit: Payer: Self-pay | Admitting: Family Medicine

## 2016-07-27 ENCOUNTER — Ambulatory Visit (HOSPITAL_COMMUNITY): Admit: 2016-07-27 | Payer: Medicare Other | Admitting: Internal Medicine

## 2016-07-27 ENCOUNTER — Ambulatory Visit (HOSPITAL_COMMUNITY)
Admission: RE | Admit: 2016-07-27 | Discharge: 2016-07-27 | Disposition: A | Discharge status: Home / Self Care | Payer: Medicare Other | Source: Ambulatory Visit | Attending: Internal Medicine | Admitting: Internal Medicine

## 2016-07-27 ENCOUNTER — Encounter (HOSPITAL_COMMUNITY)
Admission: RE | Disposition: A | Discharge status: Home / Self Care | Payer: Self-pay | Source: Ambulatory Visit | Attending: Internal Medicine

## 2016-07-27 ENCOUNTER — Encounter (HOSPITAL_COMMUNITY): Payer: Self-pay | Admitting: *Deleted

## 2016-07-27 ENCOUNTER — Encounter (HOSPITAL_COMMUNITY): Payer: Self-pay

## 2016-07-27 DIAGNOSIS — Z1211 Encounter for screening for malignant neoplasm of colon: Secondary | ICD-10-CM | POA: Insufficient documentation

## 2016-07-27 DIAGNOSIS — E785 Hyperlipidemia, unspecified: Secondary | ICD-10-CM | POA: Diagnosis not present

## 2016-07-27 DIAGNOSIS — K648 Other hemorrhoids: Secondary | ICD-10-CM | POA: Insufficient documentation

## 2016-07-27 DIAGNOSIS — E079 Disorder of thyroid, unspecified: Secondary | ICD-10-CM | POA: Insufficient documentation

## 2016-07-27 DIAGNOSIS — K5730 Diverticulosis of large intestine without perforation or abscess without bleeding: Secondary | ICD-10-CM | POA: Diagnosis not present

## 2016-07-27 DIAGNOSIS — Z880 Allergy status to penicillin: Secondary | ICD-10-CM | POA: Insufficient documentation

## 2016-07-27 DIAGNOSIS — Z7982 Long term (current) use of aspirin: Secondary | ICD-10-CM | POA: Diagnosis not present

## 2016-07-27 DIAGNOSIS — D125 Benign neoplasm of sigmoid colon: Secondary | ICD-10-CM | POA: Diagnosis not present

## 2016-07-27 DIAGNOSIS — Z800 Family history of malignant neoplasm of digestive organs: Secondary | ICD-10-CM | POA: Diagnosis not present

## 2016-07-27 DIAGNOSIS — K219 Gastro-esophageal reflux disease without esophagitis: Secondary | ICD-10-CM | POA: Insufficient documentation

## 2016-07-27 DIAGNOSIS — I10 Essential (primary) hypertension: Secondary | ICD-10-CM | POA: Insufficient documentation

## 2016-07-27 DIAGNOSIS — Z79899 Other long term (current) drug therapy: Secondary | ICD-10-CM | POA: Insufficient documentation

## 2016-07-27 HISTORY — PX: COLONOSCOPY: SHX5424

## 2016-07-27 SURGERY — COLONOSCOPY
Anesthesia: Moderate Sedation

## 2016-07-27 MED ORDER — MEPERIDINE HCL 50 MG/ML IJ SOLN
INTRAMUSCULAR | Status: AC
  Filled 2016-07-27: qty 1

## 2016-07-27 MED ORDER — MEPERIDINE HCL 50 MG/ML IJ SOLN
INTRAMUSCULAR | Status: DC | PRN
  Administered 2016-07-27 (×2): 25 mg via INTRAVENOUS

## 2016-07-27 MED ORDER — MIDAZOLAM HCL 5 MG/5ML IJ SOLN
INTRAMUSCULAR | Status: AC
  Filled 2016-07-27: qty 10

## 2016-07-27 MED ORDER — LEVOFLOXACIN IN D5W 500 MG/100ML IV SOLN
500.0000 mg | Freq: Once | INTRAVENOUS | Status: AC
  Administered 2016-07-27: 500 mg via INTRAVENOUS

## 2016-07-27 MED ORDER — MIDAZOLAM HCL 5 MG/5ML IJ SOLN
INTRAMUSCULAR | Status: DC | PRN
  Administered 2016-07-27: 2 mg via INTRAVENOUS
  Administered 2016-07-27: 1 mg via INTRAVENOUS
  Administered 2016-07-27: 2 mg via INTRAVENOUS

## 2016-07-27 MED ORDER — SODIUM CHLORIDE 0.9 % IV SOLN
INTRAVENOUS | Status: DC
  Administered 2016-07-27: 1000 mL via INTRAVENOUS

## 2016-07-27 MED ORDER — STERILE WATER FOR IRRIGATION IR SOLN
Status: DC | PRN
  Administered 2016-07-27: 2.5 mL

## 2016-07-27 MED ORDER — LEVOFLOXACIN IN D5W 500 MG/100ML IV SOLN
INTRAVENOUS | Status: AC
  Filled 2016-07-27: qty 100

## 2016-07-27 MED ORDER — CEFAZOLIN (ANCEF) 1 G IV SOLR: 2.0000 g | INTRAVENOUS | Status: AC

## 2016-07-27 NOTE — Op Note (Signed)
[facility] Patient Name: [name] Procedure Date: 07/27/2016 3:41 PM MRN: [id number] Date of Birth: Jun 29, 1941 Attending MD: Hildred Laser , MD CSN: [id number] Age: 75 Admit Type: Outpatient Procedure:                Colonoscopy Indications:              Screening in patient at increased risk: Colorectal                            cancer in brother 59 or older Providers:                Hildred Laser, MD, Lurline Del, RN, Randa Spike,                            Technician Referring MD:             Elayne Snare. Luking. MD Medicines:                Meperidine 50 mg IV, Midazolam 5 mg IV Complications:            No immediate complications. Estimated Blood Loss:     Estimated blood loss was minimal. Procedure:                Pre-Anesthesia Assessment:                           - Prior to the procedure, a History and Physical                            was performed, and patient medications and                            allergies were reviewed. The patient's tolerance of                            previous anesthesia was also reviewed. The risks                            and benefits of the procedure and the sedation                            options and risks were discussed with the patient.                            All questions were answered, and informed consent                            was obtained. Prior Anticoagulants: The patient has                            taken no previous anticoagulant or antiplatelet                            agents and last took aspirin 14 days prior to the  procedure. ASA Grade Assessment: II - A patient                            with mild systemic disease. After reviewing the                            risks and benefits, the patient was deemed in                            satisfactory condition to undergo the procedure.                           After obtaining informed consent, the colonoscope                 was passed under direct vision. Throughout the                            procedure, the patient's blood pressure, pulse, and                            oxygen saturations were monitored continuously. The                            [id number]) was introduced through the                            anus and advanced to the the cecum, identified by                            appendiceal orifice and ileocecal valve. The                            patient tolerated the procedure well. The                            colonoscopy was somewhat difficult due to                            significant looping. Successful completion of the                            procedure was aided by changing the patient to a                            supine position and withdrawing and reinserting the                            scope. The patient tolerated the procedure well.                            The quality of the bowel preparation was adequate.                            The  ileocecal valve, appendiceal orifice, and                            rectum were photographed. Scope In: 3:50:11 PM Scope Out: 4:23:13 PM Scope Withdrawal Time: 0 hours 5 minutes 50 seconds  Total Procedure Duration: 0 hours 33 minutes 2 seconds  Findings:      A few medium-mouthed diverticula were found in the sigmoid colon and       descending colon.      A 5 mm polyp was found in the sigmoid colon. The polyp was sessile. The       polyp was removed with a cold snare. Resection and retrieval were       complete.      Internal hemorrhoids were found during retroflexion. The hemorrhoids       were small. Impression:               - Diverticulosis in the sigmoid colon and in the                            descending colon.                           - One 5 mm polyp in the sigmoid colon, removed with                            a cold snare. Resected and retrieved.                           - Internal  hemorrhoids. Moderate Sedation:      Moderate (conscious) sedation was administered by the endoscopy nurse       and supervised by the endoscopist. The following parameters were       monitored: oxygen saturation, heart rate, blood pressure, CO2       capnography and response to care. Total physician intraservice time was       38 minutes. Recommendation:           - Patient has a contact number available for                            emergencies. The signs and symptoms of potential                            delayed complications were discussed with the                            patient. Return to normal activities tomorrow.                            Written discharge instructions were provided to the                            patient.                           - High fiber diet today.                           -  Continue present medications.                           - Await pathology results.                           - Repeat colonoscopy for surveillance based on                            pathology results. Procedure Code(s):        --- Professional ---                           [phone], Colonoscopy, flexible; with removal of                            tumor(s), polyp(s), or other lesion(s) by snare                            technique                           99152, Moderate sedation services provided by the                            same physician or other qualified health care                            professional performing the diagnostic or                            therapeutic service that the sedation supports,                            requiring the presence of an independent trained                            observer to assist in the monitoring of the                            patient's level of consciousness and physiological                            status; initial 15 minutes of intraservice time,                            patient age 34 years or older                            [id number], Moderate sedation services; each additional                            15 minutes intraservice time                           99153, Moderate sedation services; each additional  15 minutes intraservice time Diagnosis Code(s):        --- Professional ---                           Z80.0, Family history of malignant neoplasm of                            digestive organs                           K64.8, Other hemorrhoids                           D12.5, Benign neoplasm of sigmoid colon                           K57.30, Diverticulosis of large intestine without                            perforation or abscess without bleeding CPT copyright 2016 [employer]. All rights reserved. The codes documented in this report are preliminary and upon coder review may  be revised to meet current compliance requirements. Hildred Laser, MD Hildred Laser, MD 07/27/2016 4:30:28 PM This report has been signed electronically. Number of Addenda: 0

## 2016-07-27 NOTE — Discharge Instructions (Signed)
Resume usual medications and high fiber diet. °No driving for 24 hours. °Physician will call with biopsy results. ° °Colonoscopy, Care After °Refer to this sheet in the next few weeks. These instructions provide you with information on caring for yourself after your procedure. Your health care provider may also give you more specific instructions. Your treatment has been planned according to current medical practices, but problems sometimes occur. Call your health care provider if you have any problems or questions after your procedure. °WHAT TO EXPECT AFTER THE PROCEDURE  °After your procedure, it is typical to have the following: °· A small amount of blood in your stool. °· Moderate amounts of gas and mild abdominal cramping or bloating. °HOME CARE INSTRUCTIONS °· Do not drive, operate machinery, or sign important documents for 24 hours. °· You may shower and resume your regular physical activities, but move at a slower pace for the first 24 hours. °· Take frequent rest periods for the first 24 hours. °· Walk around or put a warm pack on your abdomen to help reduce abdominal cramping and bloating. °· Drink enough fluids to keep your urine clear or pale yellow. °· You may resume your normal diet as instructed by your health care provider. Avoid heavy or fried foods that are hard to digest. °· Avoid drinking alcohol for 24 hours or as instructed by your health care provider. °· Only take over-the-counter or prescription medicines as directed by your health care provider. °· If a tissue sample (biopsy) was taken during your procedure: °· Do not take aspirin or blood thinners for 7 days, or as instructed by your health care provider. °· Do not drink alcohol for 7 days, or as instructed by your health care provider. °· Eat soft foods for the first 24 hours. °SEEK MEDICAL CARE IF: °You have persistent spotting of blood in your stool 2-3 days after the procedure. °SEEK IMMEDIATE MEDICAL CARE IF: °· You have more than a  small spotting of blood in your stool. °· You pass large blood clots in your stool. °· Your abdomen is swollen (distended). °· You have nausea or vomiting. °· You have a fever. °· You have increasing abdominal pain that is not relieved with medicine. °  °This information is not intended to replace advice given to you by your health care provider. Make sure you discuss any questions you have with your health care provider. °  °Colon Polyps °Polyps are lumps of extra tissue growing inside the body. Polyps can grow in the large intestine (colon). Most colon polyps are noncancerous (benign). However, some colon polyps can become cancerous over time. Polyps that are larger than a pea may be harmful. To be safe, caregivers remove and test all polyps. °CAUSES  °Polyps form when mutations in the genes cause your cells to grow and divide even though no more tissue is needed. °RISK FACTORS °There are a number of risk factors that can increase your chances of getting colon polyps. They include: °· Being older than 50 years. °· Family history of colon polyps or colon cancer. °· Long-term colon diseases, such as colitis or Crohn disease. °· Being overweight. °· Smoking. °· Being inactive. °· Drinking too much alcohol. °SYMPTOMS  °Most small polyps do not cause symptoms. If symptoms are present, they may include: °· Blood in the stool. The stool may look dark red or black. °· Constipation or diarrhea that lasts longer than 1 week. °DIAGNOSIS °People often do not know they have polyps until their caregiver   finds them during a regular checkup. Your caregiver can use 4 tests to check for polyps: °· Digital rectal exam. The caregiver wears gloves and feels inside the rectum. This test would find polyps only in the rectum. °· Barium enema. The caregiver puts a liquid called barium into your rectum before taking X-rays of your colon. Barium makes your colon look white. Polyps are dark, so they are easy to see in the X-ray  pictures. °· Sigmoidoscopy. A thin, flexible tube (sigmoidoscope) is placed into your rectum. The sigmoidoscope has a light and tiny camera in it. The caregiver uses the sigmoidoscope to look at the last third of your colon. °· Colonoscopy. This test is like sigmoidoscopy, but the caregiver looks at the entire colon. This is the most common method for finding and removing polyps. °TREATMENT  °Any polyps will be removed during a sigmoidoscopy or colonoscopy. The polyps are then tested for cancer. °PREVENTION  °To help lower your risk of getting more colon polyps: °· Eat plenty of fruits and vegetables. Avoid eating fatty foods. °· Do not smoke. °· Avoid drinking alcohol. °· Exercise every day. °· Lose weight if recommended by your caregiver. °· Eat plenty of calcium and folate. Foods that are rich in calcium include milk, cheese, and broccoli. Foods that are rich in folate include chickpeas, kidney beans, and spinach. °HOME CARE INSTRUCTIONS °Keep all follow-up appointments as directed by your caregiver. You may need periodic exams to check for polyps. °SEEK MEDICAL CARE IF: °You notice bleeding during a bowel movement. °  °This information is not intended to replace advice given to you by your health care provider. Make sure you discuss any questions you have with your health care provider. °  °High-Fiber Diet °Fiber, also called dietary fiber, is a type of carbohydrate found in fruits, vegetables, whole grains, and beans. A high-fiber diet can have many health benefits. Your health care provider may recommend a high-fiber diet to help: °· Prevent constipation. Fiber can make your bowel movements more regular. °· Lower your cholesterol. °· Relieve hemorrhoids, uncomplicated diverticulosis, or irritable bowel syndrome. °· Prevent overeating as part of a weight-loss plan. °· Prevent heart disease, type 2 diabetes, and certain cancers. °WHAT IS MY PLAN? °The recommended daily intake of fiber includes: °· 38 grams for  men under age 50. °· 30 grams for men over age 50. °· 25 grams for women under age 50. °· 21 grams for women over age 50. °You can get the recommended daily intake of dietary fiber by eating a variety of fruits, vegetables, grains, and beans. Your health care provider may also recommend a fiber supplement if it is not possible to get enough fiber through your diet. °WHAT DO I NEED TO KNOW ABOUT A HIGH-FIBER DIET? °· Fiber supplements have not been widely studied for their effectiveness, so it is better to get fiber through food sources. °· Always check the fiber content on the nutrition facts label of any prepackaged food. Look for foods that contain at least 5 grams of fiber per serving. °· Ask your dietitian if you have questions about specific foods that are related to your condition, especially if those foods are not listed in the following section. °· Increase your daily fiber consumption gradually. Increasing your intake of dietary fiber too quickly may cause bloating, cramping, or gas. °· Drink plenty of water. Water helps you to digest fiber. °WHAT FOODS CAN I EAT? °Grains °Whole-grain breads. Multigrain cereal. Oats and oatmeal. Brown rice. Barley. Bulgur wheat.   Millet. Bran muffins. Popcorn. Rye wafer crackers. °Vegetables °Sweet potatoes. Spinach. Kale. Artichokes. Cabbage. Broccoli. Green peas. Carrots. Squash. °Fruits °Berries. Pears. Apples. Oranges. Avocados. Prunes and raisins. Dried figs. °Meats and Other Protein Sources °Navy, kidney, pinto, and soy beans. Split peas. Lentils. Nuts and seeds. °Dairy °Fiber-fortified yogurt. °Beverages °Fiber-fortified soy milk. Fiber-fortified orange juice. °Other °Fiber bars. °The items listed above may not be a complete list of recommended foods or beverages. Contact your dietitian for more options. °WHAT FOODS ARE NOT RECOMMENDED? °Grains °White bread. Pasta made with refined flour. White rice. °Vegetables °Fried potatoes. Canned vegetables. Well-cooked  vegetables.  °Fruits °Fruit juice. Cooked, strained fruit. °Meats and Other Protein Sources °Fatty cuts of meat. Fried poultry or fried fish. °Dairy °Milk. Yogurt. Cream cheese. Sour cream. °Beverages °Soft drinks. °Other °Cakes and pastries. Butter and oils. °The items listed above may not be a complete list of foods and beverages to avoid. Contact your dietitian for more information. °WHAT ARE SOME TIPS FOR INCLUDING HIGH-FIBER FOODS IN MY DIET? °· Eat a wide variety of high-fiber foods. °· Make sure that half of all grains consumed each day are whole grains. °· Replace breads and cereals made from refined flour or white flour with whole-grain breads and cereals. °· Replace white rice with brown rice, bulgur wheat, or millet. °· Start the day with a breakfast that is high in fiber, such as a cereal that contains at least 5 grams of fiber per serving. °· Use beans in place of meat in soups, salads, or pasta. °· Eat high-fiber snacks, such as berries, raw vegetables, nuts, or popcorn. °  °This information is not intended to replace advice given to you by your health care provider. Make sure you discuss any questions you have with your health care provider. °  ° °

## 2016-07-27 NOTE — H&P (Signed)
[name] is an 75 y.o. female.   Chief Complaint: Patient is here for colonoscopy. HPI: Patient is 75 year old African female who is here for screening colonoscopy. She denies abdominal pain change in bowel habits or rectal bleeding. Patient's last exam was normal except sigmoid diverticula and external hemorrhoids in August 2012. Family history significant for CRC and brother who was 9 with a time of diagnosis and doing well.   Past Medical History:  Diagnosis Date  . Allergy   . Angioedema   . Arthritis   . GERD (gastroesophageal reflux disease)   . Headache   . Hx of echocardiogram 10/2005   normal  . Hyperlipidemia   . Hypertension   . Thyroid disease    treated with radiation in 1980s     Past Surgical History:  Procedure Laterality Date  . ABDOMINAL HYSTERECTOMY    . CHOLECYSTECTOMY    . COLONOSCOPY  06/15/2011   Procedure: COLONOSCOPY;  Surgeon: Dawn Houston, MD;  Location: [facility];  Service: Endoscopy;  Laterality: N/A;  . EYE SURGERY     removed cyst from right eye in june 2015. sept 9th surgery for eye lid drop on both eyes  . JOINT REPLACEMENT    . REPLACEMENT TOTAL KNEE Right 2000  . TOTAL KNEE ARTHROPLASTY Left 12/27/2015   Procedure: LEFT TOTAL KNEE ARTHROPLASTY;  Surgeon: Dawn Arabian, MD;  Location: [facility];  Service: Orthopedics;  Laterality: Left;  . TUBAL LIGATION      Family History  Problem Relation Age of Onset  . Hypertension Mother   . Coronary artery disease Mother   . Hypertension Father   . Coronary artery disease Father   . Heart failure Sister   . Hypertension Sister   . Cancer Brother   . Diabetes Brother    Social History:  reports that she has never smoked. She has never used smokeless tobacco. She reports that she does not drink alcohol or use drugs.  Allergies:  Allergies  Allergen Reactions  . Other     Peanut butter- causes tongue to swell   . Penicillins Hives    Has patient had a PCN reaction causing immediate  rash, facial/tongue/throat swelling, SOB or lightheadedness with hypotension: delayed reaction Has patient had a PCN reaction causing severe rash involving mucus membranes or skin necrosis: no Has patient had a PCN reaction that required hospitalization no Has patient had a PCN reaction occurring within the last 10 years: no If all of the above answers are "NO", then may proceed with Cephalosporin use.  [name] Allergy] Other (See Comments)    Lips swelling    Medications Prior to Admission  Medication Sig Dispense Refill  . aspirin 81 MG tablet Take 81 mg by mouth daily.    . bisoprolol-hydrochlorothiazide (ZIAC) 2.5-6.25 MG tablet TAKE ONE (1) TABLET BY MOUTH EVERY DAY 90 tablet 0  . verapamil (CALAN-SR) 240 MG CR tablet TAKE ONE (1) TABLET BY MOUTH EVERY DAY 90 tablet 0  . diphenhydrAMINE (BENADRYL) 25 MG tablet Take 1 tablet (25 mg total) by mouth every 6 (six) hours as needed for itching (Rash). (Patient taking differently: Take 25 mg by mouth at bedtime. ) 30 tablet 0  . EPINEPHrine (EPI-PEN) 0.3 mg/0.3 mL SOAJ Inject 0.3 mLs (0.3 mg total) into the muscle once. 1 Device 5  . ketotifen (ALAWAY) 0.025 % ophthalmic solution Place 1 drop into both eyes 2 (two) times daily as needed (for itchy eyes).  No results found for this or any previous visit (from the past 48 hour(s)). No results found.  ROS  Blood pressure (!) 149/63, pulse 61, temperature 98.1 F (36.7 C), temperature source Oral, resp. rate 12, height 5' 8.5" (1.74 m), weight 189 lb (85.7 kg), SpO2 99 %. Physical Exam  Constitutional: She appears well-developed and well-nourished.  HENT:  Mouth/Throat: Oropharynx is clear and moist.  Eyes: Conjunctivae are normal. No scleral icterus.  Neck: No thyromegaly present.  Cardiovascular: Normal rate, regular rhythm and normal heart sounds.   No murmur heard. Respiratory: Effort normal and breath sounds normal.  GI: She exhibits no distension and no mass. There is  no tenderness.  Musculoskeletal: She exhibits no edema.  Lymphadenopathy:    She has no cervical adenopathy.  Neurological: She is alert.  Skin: Skin is warm and dry.     Assessment/Plan High risk screening colonoscopy.  Dawn Laser, MD 07/27/2016, 3:40 PM

## 2016-08-04 ENCOUNTER — Encounter (HOSPITAL_COMMUNITY): Payer: Self-pay | Admitting: Internal Medicine

## 2016-08-07 ENCOUNTER — Telehealth: Payer: Self-pay | Admitting: Family Medicine

## 2016-08-07 NOTE — Telephone Encounter (Signed)
Pt is needing something for her nerves due to her husbands passing.    [facility]

## 2016-08-07 NOTE — Telephone Encounter (Signed)
I discussed the issue with Dr. Richardson Landry as well as Larene Beach. She called in apparently the prescription was called in under the wrong first name. This was corrected in the Xanax was filled for the family to pick up.

## 2016-08-17 ENCOUNTER — Encounter (HOSPITAL_COMMUNITY): Payer: Self-pay | Admitting: Emergency Medicine

## 2016-08-17 ENCOUNTER — Emergency Department (HOSPITAL_COMMUNITY): Payer: Medicare Other

## 2016-08-17 ENCOUNTER — Emergency Department (HOSPITAL_COMMUNITY)
Admission: EM | Admit: 2016-08-17 | Discharge: 2016-08-17 | Disposition: A | Discharge status: Home / Self Care | Payer: Medicare Other | Attending: Emergency Medicine | Admitting: Emergency Medicine

## 2016-08-17 DIAGNOSIS — I10 Essential (primary) hypertension: Secondary | ICD-10-CM | POA: Insufficient documentation

## 2016-08-17 DIAGNOSIS — Y92009 Unspecified place in unspecified non-institutional (private) residence as the place of occurrence of the external cause: Secondary | ICD-10-CM | POA: Diagnosis not present

## 2016-08-17 DIAGNOSIS — Y939 Activity, unspecified: Secondary | ICD-10-CM | POA: Insufficient documentation

## 2016-08-17 DIAGNOSIS — S0181XA Laceration without foreign body of other part of head, initial encounter: Secondary | ICD-10-CM

## 2016-08-17 DIAGNOSIS — Z7982 Long term (current) use of aspirin: Secondary | ICD-10-CM | POA: Insufficient documentation

## 2016-08-17 DIAGNOSIS — Y999 Unspecified external cause status: Secondary | ICD-10-CM | POA: Diagnosis not present

## 2016-08-17 DIAGNOSIS — Z79899 Other long term (current) drug therapy: Secondary | ICD-10-CM | POA: Insufficient documentation

## 2016-08-17 DIAGNOSIS — X58XXXA Exposure to other specified factors, initial encounter: Secondary | ICD-10-CM | POA: Insufficient documentation

## 2016-08-17 DIAGNOSIS — S6991XA Unspecified injury of right wrist, hand and finger(s), initial encounter: Secondary | ICD-10-CM | POA: Diagnosis not present

## 2016-08-17 DIAGNOSIS — S61511A Laceration without foreign body of right wrist, initial encounter: Secondary | ICD-10-CM | POA: Diagnosis not present

## 2016-08-17 MED ORDER — BACITRACIN ZINC 500 UNIT/GM EX OINT
TOPICAL_OINTMENT | Freq: Once | CUTANEOUS | Status: AC
  Administered 2016-08-17: 20:00:00 via TOPICAL
  Filled 2016-08-17: qty 0.9
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
## 2016-08-17 NOTE — Discharge Instructions (Signed)
Take Tylenol as directed for pain. Wash your wounds daily with soap and water and then place a thin layer of bacitracin ointment over the wounds and cover with a sterile bandage. Signs of infection including redness around the wounds, more pain, worsening swelling, drainage from the wounds or fever. If you think you might be developing an infection, return or see your primary care physician. Your blood pressure should be rechecked within the next one or 2 weeks. Today's was elevated at 169/72.
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
## 2016-08-17 NOTE — ED Notes (Signed)
Pt tearful during exam, reports she lost her spouse last week.

## 2016-08-23 ENCOUNTER — Ambulatory Visit (INDEPENDENT_AMBULATORY_CARE_PROVIDER_SITE_OTHER): Payer: Medicare Other | Admitting: Family Medicine

## 2016-08-23 ENCOUNTER — Encounter: Payer: Self-pay | Admitting: Family Medicine

## 2016-08-23 VITALS — BP 122/78 | Ht 67.75 in | Wt 188.6 lb

## 2016-08-23 DIAGNOSIS — Z780 Asymptomatic menopausal state: Secondary | ICD-10-CM | POA: Diagnosis not present

## 2016-08-23 DIAGNOSIS — E785 Hyperlipidemia, unspecified: Secondary | ICD-10-CM | POA: Diagnosis not present

## 2016-08-23 DIAGNOSIS — L03113 Cellulitis of right upper limb: Secondary | ICD-10-CM

## 2016-08-23 DIAGNOSIS — I10 Essential (primary) hypertension: Secondary | ICD-10-CM | POA: Diagnosis not present

## 2016-08-23 MED ORDER — VERAPAMIL HCL ER 240 MG PO TBCR: EXTENDED_RELEASE_TABLET | ORAL | 0 refills | Status: DC

## 2016-08-23 MED ORDER — MUPIROCIN 2 % EX OINT: TOPICAL_OINTMENT | CUTANEOUS | 0 refills | Status: DC

## 2016-08-23 MED ORDER — DOXYCYCLINE HYCLATE 100 MG PO CAPS: 100.0000 mg | ORAL_CAPSULE | Freq: Two times a day (BID) | ORAL | 0 refills | Status: DC

## 2016-08-23 MED ORDER — BISOPROLOL-HYDROCHLOROTHIAZIDE 2.5-6.25 MG PO TABS: ORAL_TABLET | ORAL | 0 refills | Status: DC

## 2016-08-23 NOTE — Progress Notes (Signed)
   Subjective:    Patient ID: [name], female    DOB: 1941-09-16, 75 y.o.   MRN: [id number]  Hypertension  This is a chronic problem. The current episode started more than 1 year ago. Pertinent negatives include no chest pain. Risk factors for coronary artery disease include post-menopausal state. Treatments tried: ziac, verapamil. There are no compliance problems.   This patient had cut to her wrists in the cut to above her eyebrow that occurred after a something blew up in a barrel that she was burning papers in she now has some soreness around the cut on the wrist region. No soreness in the joint no fever chills She taken her blood pressure medicine on a regular basis denies any problems She does not take any cholesterol medicines but she is concerned about the possibility of heart disease we'll recheck her lipid profile Patient had bone density couple years ago she is due for a bone density   Recheck cut on right wrist  Review of Systems  Constitutional: Negative for activity change, appetite change and fatigue.  HENT: Negative for congestion.   Respiratory: Negative for cough.   Cardiovascular: Negative for chest pain.  Gastrointestinal: Negative for abdominal pain.  Endocrine: Negative for polydipsia and polyphagia.  Neurological: Negative for weakness.  Psychiatric/Behavioral: Negative for confusion.       Objective:   Physical Exam  Constitutional: She appears well-nourished. No distress.  Cardiovascular: Normal rate, regular rhythm and normal heart sounds.   No murmur heard. Pulmonary/Chest: Effort normal and breath sounds normal. No respiratory distress.  Musculoskeletal: She exhibits no edema.  Lymphadenopathy:    She has no cervical adenopathy.  Neurological: She is alert. She exhibits normal muscle tone.  Psychiatric: Her behavior is normal.  Vitals reviewed.   Cellulitis noted on the right wrist the cut to the for head looks good up-to-date on  tetanus      Assessment & Plan:  Cellulitis right wrist dockside can twice a day with snack Bactroban ointment HTN good control continue current measures Hyperlipidemia previous labs reviewed. Labs ordered Continue current measures Bone density has been good in the past needs repeating Metabolic panel because of HTN previous labs reviewed

## 2016-08-28 ENCOUNTER — Ambulatory Visit (HOSPITAL_COMMUNITY)
Admission: RE | Admit: 2016-08-28 | Discharge: 2016-08-28 | Disposition: A | Discharge status: Home / Self Care | Payer: Medicare Other | Source: Ambulatory Visit | Attending: Family Medicine | Admitting: Family Medicine

## 2016-08-28 ENCOUNTER — Ambulatory Visit: Payer: Medicare Other | Admitting: Family Medicine

## 2016-08-28 DIAGNOSIS — Z780 Asymptomatic menopausal state: Secondary | ICD-10-CM | POA: Diagnosis not present

## 2016-08-28 DIAGNOSIS — Z13820 Encounter for screening for osteoporosis: Secondary | ICD-10-CM | POA: Insufficient documentation

## 2016-12-04 ENCOUNTER — Encounter: Payer: Self-pay | Admitting: Family Medicine

## 2016-12-04 ENCOUNTER — Telehealth (INDEPENDENT_AMBULATORY_CARE_PROVIDER_SITE_OTHER): Payer: Self-pay | Admitting: Internal Medicine

## 2016-12-04 ENCOUNTER — Ambulatory Visit (INDEPENDENT_AMBULATORY_CARE_PROVIDER_SITE_OTHER): Payer: Medicare Other | Admitting: Family Medicine

## 2016-12-04 VITALS — BP 130/64 | Temp 98.7°F | Ht 67.5 in | Wt 188.5 lb

## 2016-12-04 DIAGNOSIS — K921 Melena: Secondary | ICD-10-CM | POA: Diagnosis not present

## 2016-12-04 DIAGNOSIS — R195 Other fecal abnormalities: Secondary | ICD-10-CM | POA: Diagnosis not present

## 2016-12-04 LAB — POCT HEMOGLOBIN: HEMOGLOBIN: 11.5 g/dL — AB (ref 12.2–16.2)

## 2016-12-04 MED ORDER — PANTOPRAZOLE SODIUM 40 MG PO TBEC: 40.0000 mg | DELAYED_RELEASE_TABLET | Freq: Every day | ORAL | 3 refills | Status: DC

## 2016-12-04 NOTE — Progress Notes (Signed)
   Subjective:    Patient ID: [name], female    DOB: Jul 08, 1941, 76 y.o.   MRN: [id number]  HPI Patient in today for dark stools. Onset 1 week ago.   The patient has noted that she's been him some intermittent dark stools over the past week and over the past couple days it's been morbid to tarry stool she denies it being maroon denies any high fevers with it no abdominal pain no sweats or chills.  Patient has been stressed to some degree from the sudden loss of her husband last fall in his cause some mild depression with her  Patient denies any frequent use of anti-inflammatories. States she has taken an occasional goody powder but nothing frequently   States no other concerns this visit.   Results for orders placed or performed in visit on 12/04/16  POCT hemoglobin  Result Value Ref Range   Hemoglobin 11.5 (A) 12.2 - 16.2 g/dL    Review of Systems Denies any chest tightness pressure pain shortness breath vomiting diarrhea sweats chills    Objective:   Physical Exam Lungs are clear hearts regular pulse normal hemoglobin stable abdomen soft rectal exam small amount of black tarry stool  Heme positive I did discuss the case with Dr. Viann Shove agrees that the patient will need EGD. They will be connecting with the patient help set her up. I spoke with the patient who is aware of this upcoming phone call-patient is aware to go immediately to the ER if heavy bleeding     Assessment & Plan:  Black tarry stools rectal bleeding hemodynamically stable-will connect with her gastroenterologist Dr. Dereck Leep patient will need EGD just had colonoscopy last year but still maintained up needing a colonoscopy warning signs discussed.

## 2016-12-04 NOTE — Telephone Encounter (Signed)
Patient called, stated that she had a colonoscopy in October and last week she noticed her stool is dark.  [phone]

## 2016-12-04 NOTE — Telephone Encounter (Signed)
Below are the results from the [facility] in October- A few medium-mouthed diverticula were found in the sigmoid colon and descending colon. Findings: A 5 mm polyp was found in the sigmoid colon. The polyp was sessile. The polyp was removed with a cold snare. Resection and retrieval were complete. Internal hemorrhoids were found during retroflexion. The hemorrhoids were small. - Diverticulosis in the sigmoid colon and in the descending colon. - One 5 mm polyp in the sigmoid colon, removed with a cold snare.  Will discuss with Dr.Rehman.

## 2016-12-05 ENCOUNTER — Encounter (INDEPENDENT_AMBULATORY_CARE_PROVIDER_SITE_OTHER): Payer: Self-pay | Admitting: *Deleted

## 2016-12-05 ENCOUNTER — Other Ambulatory Visit (INDEPENDENT_AMBULATORY_CARE_PROVIDER_SITE_OTHER): Payer: Self-pay | Admitting: *Deleted

## 2016-12-05 ENCOUNTER — Telehealth (INDEPENDENT_AMBULATORY_CARE_PROVIDER_SITE_OTHER): Payer: Self-pay | Admitting: *Deleted

## 2016-12-05 DIAGNOSIS — K921 Melena: Secondary | ICD-10-CM

## 2016-12-05 NOTE — Telephone Encounter (Signed)
Per Dr.Rehman the patient will be having EGD. This has been given to [place] to arrange.

## 2016-12-05 NOTE — Telephone Encounter (Signed)
Referring MD/PCP: luking   Procedure: egd  Reason/Indication:  melena  Has patient had this procedure before?  no  If so, when, by whom and where?    Is there a family history of colon cancer?    Who?  What age when diagnosed?    Is patient diabetic?         Does patient have prosthetic heart valve or mechanical valve?  no  Do you have a pacemaker?  no  Has patient ever had endocarditis? no  Has patient had joint replacement within last 12 months?  Knee replacement 12/2015  Does patient tend to be constipated or take laxatives? no  Does patient have a history of alcohol/drug use?  no  Is patient on Coumadin, Plavix and/or Aspirin? no  Medications: see epic  Allergies: see epic  Medication Adjustment:   Procedure date & time: 12/08/16 at 120

## 2016-12-07 NOTE — Telephone Encounter (Signed)
agree

## 2016-12-08 ENCOUNTER — Encounter (HOSPITAL_COMMUNITY): Payer: Self-pay

## 2016-12-08 ENCOUNTER — Ambulatory Visit (HOSPITAL_COMMUNITY)
Admission: RE | Admit: 2016-12-08 | Discharge: 2016-12-08 | Disposition: A | Discharge status: Home / Self Care | Payer: Medicare Other | Source: Ambulatory Visit | Attending: Internal Medicine | Admitting: Internal Medicine

## 2016-12-08 ENCOUNTER — Encounter (HOSPITAL_COMMUNITY)
Admission: RE | Disposition: A | Discharge status: Home / Self Care | Payer: Self-pay | Source: Ambulatory Visit | Attending: Internal Medicine

## 2016-12-08 DIAGNOSIS — Z79899 Other long term (current) drug therapy: Secondary | ICD-10-CM | POA: Insufficient documentation

## 2016-12-08 DIAGNOSIS — D62 Acute posthemorrhagic anemia: Secondary | ICD-10-CM | POA: Insufficient documentation

## 2016-12-08 DIAGNOSIS — K219 Gastro-esophageal reflux disease without esophagitis: Secondary | ICD-10-CM | POA: Insufficient documentation

## 2016-12-08 DIAGNOSIS — Z923 Personal history of irradiation: Secondary | ICD-10-CM | POA: Diagnosis not present

## 2016-12-08 DIAGNOSIS — R1013 Epigastric pain: Secondary | ICD-10-CM | POA: Insufficient documentation

## 2016-12-08 DIAGNOSIS — K2951 Unspecified chronic gastritis with bleeding: Secondary | ICD-10-CM | POA: Diagnosis not present

## 2016-12-08 DIAGNOSIS — K921 Melena: Secondary | ICD-10-CM | POA: Diagnosis not present

## 2016-12-08 DIAGNOSIS — K2950 Unspecified chronic gastritis without bleeding: Secondary | ICD-10-CM | POA: Diagnosis not present

## 2016-12-08 DIAGNOSIS — K3189 Other diseases of stomach and duodenum: Secondary | ICD-10-CM | POA: Diagnosis not present

## 2016-12-08 DIAGNOSIS — K228 Other specified diseases of esophagus: Secondary | ICD-10-CM | POA: Diagnosis not present

## 2016-12-08 DIAGNOSIS — Z86010 Personal history of colonic polyps: Secondary | ICD-10-CM | POA: Diagnosis not present

## 2016-12-08 DIAGNOSIS — Z96653 Presence of artificial knee joint, bilateral: Secondary | ICD-10-CM | POA: Insufficient documentation

## 2016-12-08 DIAGNOSIS — I10 Essential (primary) hypertension: Secondary | ICD-10-CM | POA: Diagnosis not present

## 2016-12-08 DIAGNOSIS — B9681 Helicobacter pylori [H. pylori] as the cause of diseases classified elsewhere: Secondary | ICD-10-CM | POA: Insufficient documentation

## 2016-12-08 HISTORY — PX: ESOPHAGOGASTRODUODENOSCOPY: SHX5428

## 2016-12-08 LAB — HEMOGLOBIN AND HEMATOCRIT, BLOOD
HEMATOCRIT: 33.6 % — AB (ref 36.0–46.0)
Hemoglobin: 11.5 g/dL — ABNORMAL LOW (ref 12.0–15.0)

## 2016-12-08 SURGERY — EGD (ESOPHAGOGASTRODUODENOSCOPY)
Anesthesia: Moderate Sedation

## 2016-12-08 MED ORDER — MIDAZOLAM HCL 5 MG/5ML IJ SOLN
INTRAMUSCULAR | Status: AC
  Filled 2016-12-08: qty 10

## 2016-12-08 MED ORDER — MEPERIDINE HCL 50 MG/ML IJ SOLN
INTRAMUSCULAR | Status: AC
  Filled 2016-12-08: qty 1

## 2016-12-08 MED ORDER — MIDAZOLAM HCL 5 MG/5ML IJ SOLN
INTRAMUSCULAR | Status: DC | PRN
  Administered 2016-12-08: 2 mg via INTRAVENOUS
  Administered 2016-12-08 (×2): 1 mg via INTRAVENOUS

## 2016-12-08 MED ORDER — STERILE WATER FOR IRRIGATION IR SOLN
Status: DC | PRN
  Administered 2016-12-08: 13:00:00

## 2016-12-08 MED ORDER — SODIUM CHLORIDE 0.9 % IV SOLN
INTRAVENOUS | Status: DC
  Administered 2016-12-08: 13:00:00 via INTRAVENOUS

## 2016-12-08 MED ORDER — BUTAMBEN-TETRACAINE-BENZOCAINE 2-2-14 % EX AERO
INHALATION_SPRAY | CUTANEOUS | Status: DC | PRN
  Administered 2016-12-08: 2 via TOPICAL

## 2016-12-08 MED ORDER — MEPERIDINE HCL 50 MG/ML IJ SOLN
INTRAMUSCULAR | Status: DC | PRN
  Administered 2016-12-08 (×2): 25 mg

## 2016-12-08 NOTE — Discharge Instructions (Signed)
Resume usual medications and diet. °No driving for 24 hours. °Physician will call with biopsy results. ° ° °Upper Endoscopy, Care After °Refer to this sheet in the next few weeks. These instructions provide you with information about caring for yourself after your procedure. Your health care provider may also give you more specific instructions. Your treatment has been planned according to current medical practices, but problems sometimes occur. Call your health care provider if you have any problems or questions after your procedure. °What can I expect after the procedure? °After the procedure, it is common to have: °· A sore throat. °· Bloating. °· Nausea. °Follow these instructions at home: °· Follow instructions from your health care provider about what to eat or drink after your procedure. °· Return to your normal activities as told by your health care provider. Ask your health care provider what activities are safe for you. °· Take over-the-counter and prescription medicines only as told by your health care provider. °· Do not drive for 24 hours if you received a sedative. °· Keep all follow-up visits as told by your health care provider. This is important. °Contact a health care provider if: °· You have a sore throat that lasts longer than one day. °· You have trouble swallowing. °Get help right away if: °· You have a fever. °· You vomit blood or your vomit looks like coffee grounds. °· You have bloody, black, or tarry stools. °· You have a severe sore throat or you cannot swallow. °· You have difficulty breathing. °· You have severe pain in your chest or belly. °This information is not intended to replace advice given to you by your health care provider. Make sure you discuss any questions you have with your health care provider. °Document Released: 04/09/2012 Document Revised: 03/16/2016 Document Reviewed: 07/22/2015 °Elsevier Interactive Patient Education © 2017 [employer] ° °

## 2016-12-08 NOTE — H&P (Signed)
[name] is an 76 y.o. female.   Chief Complaint: Patient is here for EGD. HPI: Patient is 76 year old African-American female was not been feeling well for the last 3 months since she lost her husband secondary to CVA(happened while he was at work). He has noted mild epigastric pain. She's not had a good appetite but has not lost weight. She was seen by Dr. Nicki Reaper looking last week because of melena. Hemoglobin was 11.5 g. Few months earlier was 13.7. He is on pantoprazole for GERD. She does not take aspirin or other OTC NSAIDs. She underwent high risk screening colonoscopy in October 2017 with removal of small tubular adenoma.    Past Medical History:  Diagnosis Date  . Allergy   . Angioedema   . Arthritis   . GERD (gastroesophageal reflux disease)   . Headache   . Hx of echocardiogram 10/2005   normal  . Hyperlipidemia   . Hypertension   . Thyroid disease    treated with radiation in 1980s     Past Surgical History:  Procedure Laterality Date  . ABDOMINAL HYSTERECTOMY    . CHOLECYSTECTOMY    . COLONOSCOPY  06/15/2011   Procedure: COLONOSCOPY;  Surgeon: Rogene Houston, MD;  Location: [facility];  Service: Endoscopy;  Laterality: N/A;  . COLONOSCOPY N/A 07/27/2016   Procedure: COLONOSCOPY;  Surgeon: Rogene Houston, MD;  Location: [facility];  Service: Endoscopy;  Laterality: N/A;  230  . EYE SURGERY     removed cyst from right eye in june 2015. sept 9th surgery for eye lid drop on both eyes  . JOINT REPLACEMENT    . REPLACEMENT TOTAL KNEE Right 2000  . 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 TUBAL LIGATION      Family History  Problem Relation Age of Onset  . Hypertension Mother   . Coronary artery disease Mother   . Hypertension Father   . Coronary artery disease Father   . Heart failure Sister   . Hypertension Sister   .  Cancer Brother   . Diabetes Brother    Social History:  reports that she has never smoked. She has never used smokeless tobacco. She reports that she does not drink alcohol or use drugs.  Allergies:  Allergies  Allergen Reactions  . Other     Peanut butter- causes tongue to swell   . Penicillins Hives    Has patient had a PCN reaction causing immediate rash, facial/tongue/throat swelling, SOB or lightheadedness with hypotension: delayed reaction Has patient had a PCN reaction causing severe rash involving mucus membranes or skin necrosis: no Has patient had a PCN reaction that required hospitalization no Has patient had a PCN reaction occurring within the last 10 years: no If all of the above answers are "NO", then may proceed with Cephalosporin use.  [name] Allergy] Other (See Comments)    Lips swelling    Medications Prior to Admission  Medication Sig Dispense Refill  . bisoprolol-hydrochlorothiazide (ZIAC) 2.5-6.25 MG tablet TAKE ONE (1) TABLET BY MOUTH EVERY DAY 90 tablet 0  . diphenhydrAMINE (BENADRYL) 25 MG tablet Take 1 tablet (25 mg total) by mouth every 6 (six) hours as needed for itching (Rash). (Patient taking differently: Take 25 mg by mouth at bedtime as needed for allergies. ) 30 tablet 0  . pantoprazole (PROTONIX) 40 MG  tablet Take 1 tablet (40 mg total) by mouth daily. 30 tablet 3  . verapamil (CALAN-SR) 240 MG CR tablet TAKE ONE (1) TABLET BY MOUTH EVERY DAY 90 tablet 0    No results found for this or any previous visit (from the past 48 hour(s)). No results found.  ROS  Blood pressure (!) 123/59, pulse (!) 56, temperature 98.3 F (36.8 C), temperature source Oral, resp. rate (!) 9, SpO2 99 %. Physical Exam  Constitutional: She appears well-developed and well-nourished.  HENT:  Mouth/Throat: Oropharynx is clear and moist.  Eyes: Conjunctivae are normal. No scleral icterus.  Neck: No thyromegaly present.  Cardiovascular: Normal rate, regular rhythm and  normal heart sounds.   No murmur heard. Respiratory: Effort normal and breath sounds normal.  GI: Soft. She exhibits no distension and no mass. There is no tenderness.  Musculoskeletal: She exhibits no edema.  Lymphadenopathy:    She has no cervical adenopathy.  Neurological: She is alert.  Skin: Skin is warm and dry.     Assessment/Plan Epigastric pain and melena. Mild anemia. Diagnostic EGD.  Hildred Laser, MD 12/08/2016, 1:38 PM

## 2016-12-08 NOTE — Op Note (Signed)
[facility] Patient Name: [name] Procedure Date: 12/08/2016 1:08 PM MRN: [id number] Date of Birth: [birth year] Attending MD: Hildred Laser , MD CSN: [id number] Age: 76 Admit Type: Outpatient Procedure:                Upper GI endoscopy Indications:              Acute post hemorrhagic anemia, Melena Providers:                Hildred Laser, MD, Charlyne Petrin RN, RN, Aram Candela Referring MD:             Elayne Snare. Luking Medicines:                Cetacaine spray, Meperidine 50 mg IV, Midazolam 4                            mg IV Complications:            No immediate complications. Estimated Blood Loss:     Estimated blood loss was minimal. Procedure:                Pre-Anesthesia Assessment:                           - Prior to the procedure, a History and Physical                            was performed, and patient medications and                            allergies were reviewed. The patient's tolerance of                            previous anesthesia was also reviewed. The risks                            and benefits of the procedure and the sedation                            options and risks were discussed with the patient.                            All questions were answered, and informed consent                            was obtained. Prior Anticoagulants: The patient has                            taken no previous anticoagulant or antiplatelet                            agents. ASA Grade Assessment: II - A patient with  mild systemic disease. After reviewing the risks                            and benefits, the patient was deemed in                            satisfactory condition to undergo the procedure.                           After obtaining informed consent, the endoscope was                            passed under direct vision. Throughout the                            procedure, the  patient's blood pressure, pulse, and                            oxygen saturations were monitored continuously. The                            [id number]) was introduced through the                            mouth, and advanced to the second part of duodenum.                            The upper GI endoscopy was accomplished without                            difficulty. The patient tolerated the procedure                            well. Scope In: 1:50:09 PM Scope Out: 1:59:02 PM Total Procedure Duration: 0 hours 8 minutes 53 seconds  Findings:      Mild mucosal changes characterized by {skip}patches of slightly raised       pale mucosa were found in the mid esophagus and in the distal esophagus.       Biopsies were taken with a cold forceps for histology.      The Z-line was irregular and was found 38 cm from the incisors.      A few, non-bleeding erosions were found in the gastric antrum. Biopsies       were taken with a cold forceps for histology.      The exam of the stomach was otherwise normal.      The ampulla, duodenal bulb and second portion of the duodenum were       normal. Impression:               - Small patches of slightly raised pale mucosa                            mucosa in the esophagus. Biopsied.                           -  Z-line irregular, 38 cm from the incisors.                           - Non-bleeding erosive gastropathy. Biopsied.                           - Normal ampulla, duodenal bulb and second portion                            of the duodenum. Moderate Sedation:      Moderate (conscious) sedation was administered by the endoscopy nurse       and supervised by the endoscopist. The following parameters were       monitored: oxygen saturation, heart rate, blood pressure, CO2       capnography and response to care. Total physician intraservice time was       14 minutes. Recommendation:           - Patient has a contact number available for                             emergencies. The signs and symptoms of potential                            delayed complications were discussed with the                            patient. Return to normal activities tomorrow.                            Written discharge instructions were provided to the                            patient.                           - Resume previous diet today.                           - Continue present medications.                           - Await pathology results.                           - Will check H&H today. Procedure Code(s):        --- Professional ---                           [id number], Esophagogastroduodenoscopy, flexible,                            transoral; with biopsy, single or multiple                           99152, Moderate sedation services provided by the  same physician or other qualified health care                            professional performing the diagnostic or                            therapeutic service that the sedation supports,                            requiring the presence of an independent trained                            observer to assist in the monitoring of the                            patient's level of consciousness and physiological                            status; initial 15 minutes of intraservice time,                            patient age 73 years or older Diagnosis Code(s):        --- Professional ---                           K22.8, Other specified diseases of esophagus                           K31.89, Other diseases of stomach and duodenum                           D62, Acute posthemorrhagic anemia                           K92.1, Melena (includes Hematochezia) CPT copyright 2016 [employer]. All rights reserved. The codes documented in this report are preliminary and upon coder review may  be revised to meet current compliance requirements. Hildred Laser,  MD Hildred Laser, MD 12/08/2016 2:10:07 PM This report has been signed electronically. Number of Addenda: 0

## 2016-12-13 ENCOUNTER — Other Ambulatory Visit: Payer: Self-pay | Admitting: Family Medicine

## 2016-12-14 ENCOUNTER — Encounter (HOSPITAL_COMMUNITY): Payer: Self-pay | Admitting: Internal Medicine

## 2016-12-14 ENCOUNTER — Other Ambulatory Visit (INDEPENDENT_AMBULATORY_CARE_PROVIDER_SITE_OTHER): Payer: Self-pay | Admitting: Internal Medicine

## 2016-12-14 MED ORDER — BIS SUBCIT-METRONID-TETRACYC 140-125-125 MG PO CAPS: 3.0000 | ORAL_CAPSULE | Freq: Three times a day (TID) | ORAL | 0 refills | Status: DC

## 2016-12-20 DIAGNOSIS — I10 Essential (primary) hypertension: Secondary | ICD-10-CM | POA: Diagnosis not present

## 2016-12-20 DIAGNOSIS — E785 Hyperlipidemia, unspecified: Secondary | ICD-10-CM | POA: Diagnosis not present

## 2016-12-21 LAB — BASIC METABOLIC PANEL
BUN / CREAT RATIO: 18 (ref 12–28)
BUN: 14 mg/dL (ref 8–27)
CO2: 24 mmol/L (ref 18–29)
CREATININE: 0.77 mg/dL (ref 0.57–1.00)
Calcium: 9.3 mg/dL (ref 8.7–10.3)
Chloride: 101 mmol/L (ref 96–106)
GFR calc Af Amer: 86 mL/min/{1.73_m2} (ref >59)
GFR, EST NON AFRICAN AMERICAN: 75 mL/min/{1.73_m2} (ref >59)
Glucose: 89 mg/dL (ref 65–99)
Potassium: 4 mmol/L (ref 3.5–5.2)
SODIUM: 141 mmol/L (ref 134–144)

## 2016-12-21 LAB — LIPID PANEL
CHOLESTEROL TOTAL: 200 mg/dL — AB (ref 100–199)
Chol/HDL Ratio: 4.4 ratio units (ref 0.0–4.4)
HDL: 45 mg/dL (ref >39)
LDL Calculated: 134 mg/dL — ABNORMAL HIGH (ref 0–99)
Triglycerides: 103 mg/dL (ref 0–149)
VLDL CHOLESTEROL CAL: 21 mg/dL (ref 5–40)

## 2017-01-02 ENCOUNTER — Encounter: Payer: Self-pay | Admitting: Family Medicine

## 2017-01-02 ENCOUNTER — Ambulatory Visit (INDEPENDENT_AMBULATORY_CARE_PROVIDER_SITE_OTHER): Payer: Medicare Other | Admitting: Family Medicine

## 2017-01-02 VITALS — BP 132/62 | Ht 68.0 in | Wt 189.0 lb

## 2017-01-02 DIAGNOSIS — E785 Hyperlipidemia, unspecified: Secondary | ICD-10-CM | POA: Diagnosis not present

## 2017-01-02 DIAGNOSIS — I10 Essential (primary) hypertension: Secondary | ICD-10-CM

## 2017-01-02 NOTE — Progress Notes (Signed)
   Subjective:    Patient ID: [name], female    DOB: Mar 10, 1941, 76 y.o.   MRN: [id number]  HPI Patient in today to discuss lab results from 12/20/16.   States no other concerns this visit.  20 minutes spent with the patient today discussing multiple different aspects of her EGD her biopsy and also her medicine regimen. Also significant discussion regarding hyperlipidemia increased risk for heart disease patient does not one to start statins at this point  Review of Systems  Constitutional: Negative for activity change, fatigue and fever.  Respiratory: Negative for cough and shortness of breath.   Cardiovascular: Negative for chest pain and leg swelling.  Neurological: Negative for headaches.       Objective:   Physical Exam  Constitutional: She appears well-nourished. No distress.  Cardiovascular: Normal rate, regular rhythm and normal heart sounds.   No murmur heard. Pulmonary/Chest: Effort normal and breath sounds normal. No respiratory distress.  Musculoskeletal: She exhibits no edema.  Lymphadenopathy:    She has no cervical adenopathy.  Neurological: She is alert. She exhibits normal muscle tone.  Psychiatric: Her behavior is normal.  Vitals reviewed.  Patient had multiple questions regarding her EGD her pathology report and the treatment thereof. The patient did in fact have H pylori. She is taking her current regimen doing well with this. No further testing would be necessary. No sign of any cancer.       Assessment & Plan:  Blood pressure good control continue current measures  Hyperlipidemia previous labs reviewed patient states she is can try red rice yeast extract and see how this does she will repeat lab work again in approximately 4 months with follow-up office visit  H pylori positive gastritis finish out the regimen that gastroenterology prescribed continue PPI

## 2017-01-12 ENCOUNTER — Other Ambulatory Visit: Payer: Self-pay | Admitting: Family Medicine

## 2017-01-12 DIAGNOSIS — H43813 Vitreous degeneration, bilateral: Secondary | ICD-10-CM | POA: Diagnosis not present

## 2017-02-08 ENCOUNTER — Encounter (HOSPITAL_COMMUNITY): Payer: Self-pay | Admitting: *Deleted

## 2017-02-08 ENCOUNTER — Emergency Department (HOSPITAL_COMMUNITY)
Admission: EM | Admit: 2017-02-08 | Discharge: 2017-02-08 | Disposition: A | Discharge status: Home / Self Care | Payer: Medicare Other | Attending: Emergency Medicine | Admitting: Emergency Medicine

## 2017-02-08 DIAGNOSIS — I10 Essential (primary) hypertension: Secondary | ICD-10-CM | POA: Diagnosis not present

## 2017-02-08 DIAGNOSIS — R220 Localized swelling, mass and lump, head: Secondary | ICD-10-CM | POA: Diagnosis present

## 2017-02-08 DIAGNOSIS — Z79899 Other long term (current) drug therapy: Secondary | ICD-10-CM | POA: Diagnosis not present

## 2017-02-08 DIAGNOSIS — T783XXA Angioneurotic edema, initial encounter: Secondary | ICD-10-CM | POA: Diagnosis not present

## 2017-02-08 MED ORDER — PREDNISONE 20 MG PO TABS: 20.0000 mg | ORAL_TABLET | Freq: Every day | ORAL | 0 refills | Status: DC

## 2017-02-08 MED ORDER — PREDNISONE 20 MG PO TABS
40.0000 mg | ORAL_TABLET | Freq: Once | ORAL | Status: AC
  Administered 2017-02-08: 40 mg via ORAL
  Filled 2017-02-08: qty 2

## 2017-02-08 NOTE — ED Provider Notes (Signed)
[facility] DEPT Provider Note   CSN: [id number] Arrival date & time: 02/08/17  1824     History   Chief Complaint Chief Complaint  Patient presents with  . Oral Swelling    HPI [name] is a 76 y.o. female.  HPI Patient presents with right-sided tongue swelling. Began around 4:00 today. Ate a chicken salad sandwich earlier today that she thinks she kind of had some of her allergens in it. History of angioedema in the past. No ACE inhibitor's. Has had some cousins with similar symptoms in the past. Has had oral Benadryl for this. No difficulty breathing. No nausea or vomiting. No difficulty swallowing.   Past Medical History:  Diagnosis Date  . Allergy   . Angioedema   . Arthritis   . GERD (gastroesophageal reflux disease)   . Headache   . Hx of echocardiogram 10/2005   normal  . Hyperlipidemia   . Hypertension   . 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 Melena 12/05/2016  . Family hx of colon cancer 06/08/2016  . OA (osteoarthritis) of knee 12/27/2015  . Screening for osteoporosis 02/25/2014  . 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 Allergic reaction 05/13/2013  . Hyperlipemia 04/24/2013  . Hypertension 04/24/2013    Past Surgical History:  Procedure Laterality Date  . ABDOMINAL HYSTERECTOMY    . CHOLECYSTECTOMY    . COLONOSCOPY  06/15/2011   Procedure: COLONOSCOPY;  Surgeon: Rogene Houston, MD;  [facility];  Service: Endoscopy;  Laterality: N/A;  . COLONOSCOPY N/A 07/27/2016   Procedure: COLONOSCOPY;  Surgeon: Rogene Houston, MD;  Location: [facility];  Service: Endoscopy;  Laterality: N/A;  230  . ESOPHAGOGASTRODUODENOSCOPY N/A 12/08/2016   Procedure: ESOPHAGOGASTRODUODENOSCOPY (EGD);  Surgeon: Rogene Houston, MD;  Location: [facility];  Service: Endoscopy;  Laterality: N/A;  120  . EYE SURGERY     removed cyst from  right eye in june 2015. sept 9th surgery for eye lid drop on both eyes  . JOINT REPLACEMENT    . REPLACEMENT TOTAL KNEE Right 2000  . 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 TUBAL LIGATION      OB History    No data available       Home Medications    Prior to Admission medications   Medication Sig Start Date End Date Taking? Authorizing Provider  bisoprolol-hydrochlorothiazide (ZIAC) 2.5-6.25 MG tablet TAKE ONE TABLET BY MOUTH EVERY DAY. 01/12/17  Yes Kathyrn Drown, MD  omeprazole (PRILOSEC) 20 MG capsule Take 20 mg by mouth daily as needed (for acid reflux).  12/27/16  Yes Historical Provider, MD  prednisoLONE acetate (PRED FORTE) 1 % ophthalmic suspension Place 1 drop into both eyes 2 (two) times daily.  01/12/17  Yes Historical Provider, MD  RESTASIS MULTIDOSE 0.05 % ophthalmic emulsion Place 1 drop into both eyes 2 (two) times daily.  01/12/17  Yes Historical Provider, MD  verapamil (CALAN-SR) 240 MG CR tablet TAKE ONE TABLET BY MOUTH EVERY DAY. 12/13/16  Yes Kathyrn Drown, MD  bismuth-metronidazole-tetracycline [facility]) [phone] MG capsule Take 3 capsules by mouth 4 (four) times daily -  before meals and at bedtime. Patient not taking: Reported on 02/08/2017 12/14/16   Mechele Dawley  Rehman, MD  diphenhydrAMINE (BENADRYL) 25 MG tablet Take 1 tablet (25 mg total) by mouth every 6 (six) hours as needed for itching (Rash). Patient not taking: Reported on 02/08/2017 11/04/14   Noemi Chapel, MD  pantoprazole (PROTONIX) 40 MG tablet Take 1 tablet (40 mg total) by mouth daily. Patient not taking: Reported on 02/08/2017 12/04/16   Kathyrn Drown, MD  predniSONE (DELTASONE) 20 MG tablet Take 1 tablet (20 mg total) by mouth daily. 02/09/17   Davonna Belling, MD    Family History Family History  Problem Relation Age of Onset  . Hypertension Mother   . Coronary artery  disease Mother   . Hypertension Father   . Coronary artery disease Father   . Heart failure Sister   . Hypertension Sister   . Cancer Brother   . Diabetes Brother     Social History Social History  Substance Use Topics  . Smoking status: Never Smoker  . Smokeless tobacco: Never Used  . Alcohol use No     Allergies   Other; Penicillins; and Salmon [fish allergy]   Review of Systems Review of Systems  Constitutional: Negative for appetite change.  HENT: Negative for facial swelling, mouth sores, sinus pressure, sore throat and trouble swallowing.        Tongue swelling  Respiratory: Negative for shortness of breath.   Cardiovascular: Negative for chest pain.  Gastrointestinal: Negative for abdominal pain.  Genitourinary: Negative for flank pain.  Musculoskeletal: Negative for back pain.  Neurological: Negative for numbness.  Hematological: Negative for adenopathy.  Psychiatric/Behavioral: Negative for behavioral problems.     Physical Exam Updated Vital Signs BP (!) 143/64   Pulse 60   Temp 98.6 F (37 C) (Oral)   Resp 20   Ht 5\' 8"  (1.727 m)   Wt 189 lb (85.7 kg)   SpO2 99%   BMI 28.74 kg/m   Physical Exam  Constitutional: She appears well-developed.  HENT:  Head: Atraumatic.  Angioedema of right side of tongue. No swelling floor mouth. No posterior pharyngeal involvement. No stridor.  Neck: Neck supple.  Cardiovascular: Normal rate.   Pulmonary/Chest: Effort normal. She has no wheezes.  Abdominal: Soft.  Musculoskeletal: She exhibits no edema.  Neurological: She is alert.  Skin: Skin is warm. Capillary refill takes less than 2 seconds.  Psychiatric: She has a normal mood and affect.     ED Treatments / Results  Labs (all labs ordered are listed, but only abnormal results are displayed) Labs Reviewed - No data to display  EKG  EKG Interpretation None       Radiology No results found.  Procedures Procedures (including critical care  time)  Medications Ordered in ED Medications  predniSONE (DELTASONE) tablet 40 mg (40 mg Oral Given 02/08/17 1921)     Initial Impression / Assessment and Plan / ED Course  I have reviewed the triage vital signs and the nursing notes.  Pertinent labs & imaging results that were available during my care of the patient were reviewed by me and considered in my medical decision making (see chart for details).   patient with angioedema of the right side of the tongue. Has had the same in the past. Has been stable to improved during the time the ER. Will discharge home.  Final Clinical Impressions(s) / ED Diagnoses   Final diagnoses:  Angioedema, initial encounter    New Prescriptions Discharge Medication List as of 02/08/2017  9:13 PM    START taking these medications  Details  predniSONE (DELTASONE) 20 MG tablet Take 1 tablet (20 mg total) by mouth daily., Starting Fri 02/09/2017, Print         Davonna Belling, MD 02/09/17 [phone]

## 2017-02-08 NOTE — ED Triage Notes (Addendum)
Pt c/o right sided tongue swelling that started around 1600 today. Pt is allergic to wheat oats and ate a chicken salad sandwich at 1330 today which possibly could have had wheat oats in it. Pt is on BP medication, but no ACE inhibitors. Denies SOB. O2 sats 98% on RA. Pt reports she took some liquid Benadryl at 1645.

## 2017-02-14 ENCOUNTER — Telehealth: Payer: Self-pay | Admitting: Family Medicine

## 2017-02-14 MED ORDER — PREDNISONE 20 MG PO TABS: ORAL_TABLET | ORAL | 0 refills | Status: DC

## 2017-02-14 NOTE — Telephone Encounter (Signed)
Talk with pt if becomes sig or worsens back to e r . Adult pred taper

## 2017-02-14 NOTE — Telephone Encounter (Signed)
Patient was seen at the ER on 02/08/17 for her tongue swelling.  She started having swelling again last night and would like Rx for prednisone called in.   [place]

## 2017-02-14 NOTE — Telephone Encounter (Signed)
Patient was notified med sent to pharmacy. If becomes significant or worsens back to ER. Patient verbalized understanding.

## 2017-02-14 NOTE — Telephone Encounter (Signed)
Pred taper sent to pharmacy. Called home number and gentleman stated that patient was not home. Called cell number and received no answer.

## 2017-03-07 ENCOUNTER — Ambulatory Visit (INDEPENDENT_AMBULATORY_CARE_PROVIDER_SITE_OTHER): Payer: Medicare Other | Admitting: Family Medicine

## 2017-03-07 VITALS — BP 122/82 | Ht 68.0 in | Wt 188.4 lb

## 2017-03-07 DIAGNOSIS — T783XXA Angioneurotic edema, initial encounter: Secondary | ICD-10-CM

## 2017-03-07 MED ORDER — PREDNISONE 20 MG PO TABS: ORAL_TABLET | ORAL | 1 refills | Status: DC

## 2017-03-07 MED ORDER — EPINEPHRINE 0.3 MG/0.3ML IJ SOAJ: 0.3000 mg | Freq: Once | INTRAMUSCULAR | 0 refills | Status: AC

## 2017-03-07 NOTE — Progress Notes (Signed)
   Subjective:    Patient ID: [name], female    DOB: 03-Aug-1941, 76 y.o.   MRN: [id number]  HPI Patient has arrives with swelling in face and lips. Patient noticed after eating some cashews Monday.  Patient has never had a reaction to cashews but is allergic to peanuts. She is had this since age 59 She is seen allergist twice without any success She gets this intermittently more so recently  Review of Systems Denies any airway compromise denies fever chills sweats denies nausea vomiting diarrhea    Objective:   Physical Exam Blood pressure good angioedema noted on the lower lip some lower jaw airway is normal lungs clear heart regular       Assessment & Plan:  Idiopathic angioedema-it is possible it was triggered by cashews patient does not want to go for further testing she is been to allergist twice without any significant response I do recommend generic Zyrtec daily Avoid any triggers she is aware of New EpiPen prescribed Prednisone taper Follow-up if problems Follow-up for regular blood pressure checks

## 2017-03-15 ENCOUNTER — Other Ambulatory Visit: Payer: Self-pay | Admitting: Family Medicine

## 2017-03-16 DIAGNOSIS — H524 Presbyopia: Secondary | ICD-10-CM | POA: Diagnosis not present

## 2017-03-16 DIAGNOSIS — H25813 Combined forms of age-related cataract, bilateral: Secondary | ICD-10-CM | POA: Diagnosis not present

## 2017-03-16 DIAGNOSIS — H52223 Regular astigmatism, bilateral: Secondary | ICD-10-CM | POA: Diagnosis not present

## 2017-03-16 DIAGNOSIS — H5203 Hypermetropia, bilateral: Secondary | ICD-10-CM | POA: Diagnosis not present

## 2017-04-26 DIAGNOSIS — S43492A Other sprain of left shoulder joint, initial encounter: Secondary | ICD-10-CM | POA: Diagnosis not present

## 2017-05-02 DIAGNOSIS — S43492D Other sprain of left shoulder joint, subsequent encounter: Secondary | ICD-10-CM | POA: Diagnosis not present

## 2017-05-03 ENCOUNTER — Ambulatory Visit (INDEPENDENT_AMBULATORY_CARE_PROVIDER_SITE_OTHER): Payer: Medicare Other | Admitting: Family Medicine

## 2017-05-03 ENCOUNTER — Encounter: Payer: Self-pay | Admitting: Family Medicine

## 2017-05-03 VITALS — BP 132/64 | Ht 68.0 in | Wt 190.2 lb

## 2017-05-03 DIAGNOSIS — I10 Essential (primary) hypertension: Secondary | ICD-10-CM | POA: Diagnosis not present

## 2017-05-03 DIAGNOSIS — E785 Hyperlipidemia, unspecified: Secondary | ICD-10-CM | POA: Diagnosis not present

## 2017-05-03 NOTE — Progress Notes (Signed)
   Subjective:    Patient ID: [name], female    DOB: [birth year], 76 y.o.   MRN: [id number]  Hypertension  This is a chronic problem. The current episode started more than 1 year ago. Pertinent negatives include no chest pain, headaches or shortness of breath. Risk factors for coronary artery disease include post-menopausal state. Treatments tried: ziac, verapamil. There are no compliance problems.    Patient fell and injured left shoulder seeing orthopedist complains of discomfort will follow-up with them and do physical therapy   Review of Systems  Constitutional: Negative for activity change, fatigue and fever.  Respiratory: Negative for cough and shortness of breath.   Cardiovascular: Negative for chest pain and leg swelling.  Neurological: Negative for headaches.       Objective:   Physical Exam  Constitutional: She appears well-nourished. No distress.  Cardiovascular: Normal rate, regular rhythm and normal heart sounds.   No murmur heard. Pulmonary/Chest: Effort normal and breath sounds normal. No respiratory distress.  Musculoskeletal: She exhibits no edema.  Lymphadenopathy:    She has no cervical adenopathy.  Neurological: She is alert. She exhibits normal muscle tone.  Psychiatric: Her behavior is normal.  Vitals reviewed.         Assessment & Plan:  Left shoulder pain follow-up with specialist physical therapy exercise sheets given  HTN good control continue current measures  Follow-up within 6 months

## 2017-05-04 ENCOUNTER — Ambulatory Visit (HOSPITAL_COMMUNITY): Payer: Medicare Other | Attending: Physician Assistant

## 2017-05-04 ENCOUNTER — Ambulatory Visit: Payer: Medicare Other | Admitting: Family Medicine

## 2017-05-04 ENCOUNTER — Encounter (HOSPITAL_COMMUNITY): Payer: Self-pay

## 2017-05-04 DIAGNOSIS — R29898 Other symptoms and signs involving the musculoskeletal system: Secondary | ICD-10-CM | POA: Diagnosis not present

## 2017-05-04 DIAGNOSIS — M6281 Muscle weakness (generalized): Secondary | ICD-10-CM | POA: Diagnosis not present

## 2017-05-04 DIAGNOSIS — M25512 Pain in left shoulder: Secondary | ICD-10-CM | POA: Diagnosis not present

## 2017-05-04 DIAGNOSIS — R293 Abnormal posture: Secondary | ICD-10-CM | POA: Diagnosis not present

## 2017-05-04 NOTE — Therapy (Signed)
[facility] [place], [place], [address] Phone: [phone]   Fax:  [phone]  Physical Therapy Evaluation  Patient Details  Name: [name] MRN: [id number] Date of Birth: Dec 04, 1940 Referring Provider: Dr. Theda Sers  Encounter Date: 05/04/2017      PT End of Session - 05/04/17 1217    Visit Number 1   Number of Visits 17   Date for PT Re-Evaluation 06/01/17   Authorization Type [employer]   Authorization Time Period 05/04/17 to 06/29/17   Authorization - Visit Number 1   Authorization - Number of Visits 10   PT Start Time 1740   PT Stop Time 1205   PT Time Calculation (min) 42 min   Activity Tolerance Patient tolerated treatment well;Patient limited by pain   Behavior During Therapy [facility] for tasks assessed/performed      Past Medical History:  Diagnosis Date  . Allergy   . Angioedema   . Arthritis   . GERD (gastroesophageal reflux disease)   . Headache   . Hx of echocardiogram 10/2005   normal  . Hyperlipidemia   . Hypertension   . Thyroid disease    treated with radiation in 1980s     Past Surgical History:  Procedure Laterality Date  . ABDOMINAL HYSTERECTOMY    . CHOLECYSTECTOMY    . COLONOSCOPY  06/15/2011   Procedure: COLONOSCOPY;  Surgeon: Rogene Houston, MD;  [facility];  Service: Endoscopy;  Laterality: N/A;  . COLONOSCOPY N/A 07/27/2016   Procedure: COLONOSCOPY;  Surgeon: Rogene Houston, MD;  [facility];  Service: Endoscopy;  Laterality: N/A;  230  . ESOPHAGOGASTRODUODENOSCOPY N/A 12/08/2016   Procedure: ESOPHAGOGASTRODUODENOSCOPY (EGD);  Surgeon: Rogene Houston, MD;  [facility];  Service: Endoscopy;  Laterality: N/A;  120  . EYE SURGERY     removed cyst from right eye in june 2015. sept 9th surgery for eye lid drop on both eyes  . JOINT REPLACEMENT    . REPLACEMENT TOTAL KNEE Right 2000  . TOTAL KNEE ARTHROPLASTY Left 12/27/2015   Procedure: LEFT  TOTAL KNEE ARTHROPLASTY;  Surgeon: Gaynelle Arabian, MD;  Location: [facility];  Service: Orthopedics;  Laterality: Left;  . TUBAL LIGATION      There were no vitals filed for this visit.       Subjective Assessment - 05/04/17 1125    Subjective Pt states that she fell on 04/21/17 when she went to step down, lost her balance and landed on her L shoulder. Her doctor told her to keep her L arm in a sling because he "didn't want her to damage to rotator cuff." She said that he did take an x-ray and she reports that there was no fractures or damage to the rotator cuff. She states that her pain is located primarily at her shoulder. She denies any sharp shooting pains, radicular symptoms, or n/t. She does report a decrease in her R grip strength since she fell. She states that she has the most difficulty moving her arm. She wears her sling when she is up moving around, but not when she is resting.    Pertinent History HTN, hyperlipidemia, arthritis, GERD, thyroid disease   Limitations Lifting;House hold activities   How long can you sit comfortably? 2-3 hours   How long can you stand comfortably? no issues   How long can you walk comfortably? no issues   Diagnostic tests per pt, she reports she had  x-ray with Dr. Theda Sers which was negative for fractures or RTV damage   Patient Stated Goals get 100% back   Currently in Pain? Yes   Pain Score 5    Pain Location Shoulder   Pain Orientation Left   Pain Descriptors / Indicators Shooting   Pain Type Acute pain   Pain Onset 1 to 4 weeks ago   Pain Frequency Constant   Aggravating Factors  moving it, cold air   Pain Relieving Factors rest, ice   Effect of Pain on Daily Activities increases            [facility] PT Assessment - 05/04/17 0001      Assessment   Medical Diagnosis L shoulder sprain   Referring Provider Dr. Theda Sers   Onset Date/Surgical Date 04/21/17   Hand Dominance Right   Next MD Visit PRN   Prior Therapy none for present issue      Balance Screen   Has the patient fallen in the past 6 months Yes   How many times? 1   Has the patient had a decrease in activity level because of a fear of falling?  Yes   Is the patient reluctant to leave their home because of a fear of falling?  No     Prior Function   Level of Independence Independent;Independent with basic ADLs   Vocation Retired   Leisure goes to the [employer], card games, outdoor games     Observation/Other Assessments   Observations Pt guarded throughout session, requiring cues for relaxation   Focus on Therapeutic Outcomes (FOTO)  96% limitation     Sensation   Light Touch Appears Intact     ROM / Strength   AROM / PROM / Strength AROM;PROM;Strength     AROM   Overall AROM Comments RUE WNL   Left Shoulder Flexion 20 Degrees  sitting, pain; 30 deg supine   Left Shoulder ABduction 35 Degrees  sitting, pain; 45 deg supine     PROM   Overall PROM Comments GH joint end feel through available PROM normal; able to smoothly elevated pt's shoulder once she was relaxed and allowed pt to perform PROM   Left Shoulder Flexion 110 Degrees  gross assessment, in sitting   Left Shoulder ABduction 60 Degrees  gross assessment   Left Shoulder Internal Rotation 45 Degrees  pain   Left Shoulder External Rotation 50 Degrees  pain     Strength   Right Shoulder Flexion 4/5   Right Shoulder ABduction 4/5   Right Shoulder Internal Rotation 4+/5   Right Shoulder External Rotation 4+/5   Left Shoulder Internal Rotation 3+/5  painful   Left Shoulder External Rotation 4-/5  painful   Right Elbow Flexion 5/5   Right Elbow Extension 5/5   Left Elbow Flexion 5/5   Left Elbow Extension 5/5   Right Wrist Flexion 5/5   Right Wrist Extension 5/5   Right Hand Gross Grasp Functional   Right Hand Grip (lbs) 44   Left Hand Gross Grasp Functional   Left Hand Grip (lbs) 38     Palpation   Palpation comment increased soft tissue restrictions and tenderness to palpation  of biceps long head tendon, deltoid, supraspinatus, infraspinatus, UT on LUE     Special Tests    Special Tests Rotator Cuff Impingement   Rotator Cuff Impingment tests Michel Bickers test     Hawkins-Kennedy test   Findings Positive   Side Left   Comments pain with test  Objective measurements completed on examination: See above findings.         PT Education - 05/04/17 1216    Education provided Yes   Education Details exam findings, called pt after evaluation to let her know to stay in sling until she comes in Monday since the referring PA was unavailable on 05/04/17 and he is supposed to call back on Monday.   Person(s) Educated Patient   Methods Explanation   Comprehension Verbalized understanding          PT Short Term Goals - 05/04/17 1217      PT SHORT TERM GOAL #1   Title Pt will be independent with HEP and perform consistently to maximize her return to PLOF and decrease risk of reinjury.   Time 4   Period Weeks   Status New     PT SHORT TERM GOAL #2   Title Pt will be able to perform L shoulder flexion and abd AROM to at least 90 deg to decrease pain and maximzie overall function at home.   Time 4   Period Weeks   Status New     PT SHORT TERM GOAL #3   Title Pt will report being able to sleep through the night without awakening due to pain in order to maximize recovery.   Time 4   Period Weeks   Status New           PT Long Term Goals - 05/04/17 1230      PT LONG TERM GOAL #1   Title Pt will have L shoulder flexion and abd AROM to at least 150 deg each without pain in order to maximize pt's ability to dress and bathe.   Time 8   Period Weeks   Status New     PT LONG TERM GOAL #2   Title Pt will have at least 4+/5 strength in LUE in order to maximize her ability to perform ADLs and IADLs at home with no pain.   Time 8   Period Weeks   Status New     PT LONG TERM GOAL #3   Title Pt will have an improvement in her QuickDASH by 8%  or > to demonstrate improved overall perceived function.   Time 8   Period Weeks   Status New                Plan - 05/04/17 1235    Clinical Impression Statement Pt is pleasant 76 YO F who presents to [facility] with c/o L shoulder pain s/p fall on 04/21/17. Pt significantly guarded throughout session and required multiple cues for relaxation. Pt's AROM significantly impaired due to pain, PROM was also limited due to pain but was > than AROM, and she had increased soft tissue restrictions as well as tenderness to palpation of L shoulder musculature. She also scored a 73% on the QuickDASH indicating significant impairments due to her L shoulder pain. Pt presented to therapy in a sling and stated that the PA who referred her told her she needed to wear it when she was up and moving around so as to "not damage her rotator cuff." Pt also stated that her imaging was negative for any fractures or RTC damage. PT attempted to contact the referring PA, Shelle Iron, but he was in surgery for the day. PT left a message for him to call back on Monday to let the treating therapist know the official results of the imaging that was taken and  if she could take her out of the sling if there was no damage. Pt needs skilled PT intervention to maximize overall ROM, strength, and function to promote return to PLOF and improve her QOL.    History and Personal Factors relevant to plan of care: acute injury, first time injury of L shoulder, HTN, hyperlipidemia, thyroid disease   Clinical Presentation Stable   Clinical Presentation due to: decreased A/PROM, MMT,    Clinical Decision Making Low   Rehab Potential Fair   PT Frequency 2x / week   PT Duration 8 weeks   PT Treatment/Interventions ADLs/Self Care Home Management;Cryotherapy;Electrical Stimulation;Iontophoresis 4mg /ml Dexamethasone;Moist Heat;Ultrasound;Therapeutic activities;Therapeutic exercise;Balance training;Neuromuscular re-education;Patient/family  education;Manual techniques;Passive range of motion;Dry needling;Taping   PT Next Visit Plan review goals, educate on sling use and ensure no precautions once communicated with Shelle Iron, PA-C (her referring physician), manual to shoulder musculature for pain management, PROM in all directions, AAROM with wand, trial sidelying AROM; trial pulleys for flexion/abd   PT Home Exercise Plan issue next session   Consulted and Agree with Plan of Care Patient      Patient will benefit from skilled therapeutic intervention in order to improve the following deficits and impairments:  Decreased activity tolerance, Decreased knowledge of precautions, Decreased range of motion, Decreased strength, Increased muscle spasms, Impaired perceived functional ability, Impaired UE functional use, Postural dysfunction, Pain  Visit Diagnosis: Acute pain of left shoulder - Plan: PT plan of care cert/re-cert  Other symptoms and signs involving the musculoskeletal system - Plan: PT plan of care cert/re-cert  Muscle weakness (generalized) - Plan: PT plan of care cert/re-cert  Abnormal posture - Plan: PT plan of care cert/re-cert      G-Codes - 15/17/61 1246    Functional Assessment Tool Used (Outpatient Only) FOTO, clinical judgement, AROM, PROM, QuickDASH   Functional Limitation Mobility: Walking and moving around   Mobility: Walking and Moving Around Current Status [phone]) At least 60 percent but less than 80 percent impaired, limited or restricted   Mobility: Walking and Moving Around Goal Status [phone]) At least 20 percent but less than 40 percent impaired, limited or restricted       Problem List Patient Active Problem List   Diagnosis Date Noted  . Melena 12/05/2016  . Family hx of colon cancer 06/08/2016  . OA (osteoarthritis) of knee 12/27/2015  . Screening for osteoporosis 02/25/2014  . 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 Allergic reaction 05/13/2013   . Hyperlipemia 04/24/2013  . Hypertension 04/24/2013     Geraldine Solar PT, DPT  [facility] [address] [place], [place], [address] Phone: [phone]   Fax:  [phone]  Name: [name] MRN: [id number] Date of Birth: October 11, 1941

## 2017-05-07 ENCOUNTER — Ambulatory Visit (HOSPITAL_COMMUNITY): Payer: Medicare Other | Admitting: Physical Therapy

## 2017-05-07 DIAGNOSIS — M6281 Muscle weakness (generalized): Secondary | ICD-10-CM | POA: Diagnosis not present

## 2017-05-07 DIAGNOSIS — R29898 Other symptoms and signs involving the musculoskeletal system: Secondary | ICD-10-CM

## 2017-05-07 DIAGNOSIS — M25512 Pain in left shoulder: Secondary | ICD-10-CM

## 2017-05-07 DIAGNOSIS — R293 Abnormal posture: Secondary | ICD-10-CM

## 2017-05-07 NOTE — Patient Instructions (Signed)
ROM: Flexion - Wand (Supine)    Lie on back holding wand. Raise arms over head. Repeat _5_ times per set. Do _2_ sets per session. Do _2__ sessions per day.     Elevation (Active)    Shrug shoulders up, breathing in. Relax, breathing out. Repeat _10__ times. Do __2__ sessions per day.

## 2017-05-07 NOTE — Therapy (Signed)
[place], [place], [address] Phone: [phone]   Fax:  [phone]  Physical Therapy Treatment  Patient Details  Name: [name] MRN: [id number] Date of Birth: [birth year] Referring Provider: Dr. Theda Sers  Encounter Date: 05/07/2017      PT End of Session - 05/07/17 1739    Visit Number 2   Number of Visits 17   Date for PT Re-Evaluation 06/01/17   Authorization Type [employer] Medicare   Authorization Time Period 05/04/17 to 06/29/17   Authorization - Visit Number 2   Authorization - Number of Visits 10   PT Start Time 1648   PT Stop Time 1726   PT Time Calculation (min) 38 min   Activity Tolerance Patient tolerated treatment well;Patient limited by pain   Behavior During Therapy [facility] for tasks assessed/performed      Past Medical History:  Diagnosis Date  . Allergy   . Angioedema   . Arthritis   . GERD (gastroesophageal reflux disease)   . Headache   . Hx of echocardiogram 10/2005   normal  . Hyperlipidemia   . Hypertension   . Thyroid disease    treated with radiation in 1980s     Past Surgical History:  Procedure Laterality Date  . ABDOMINAL HYSTERECTOMY    . CHOLECYSTECTOMY    . COLONOSCOPY  06/15/2011   Procedure: COLONOSCOPY;  Surgeon: Rogene Houston, MD;  [facility];  Service: Endoscopy;  Laterality: N/A;  . COLONOSCOPY N/A 07/27/2016   Procedure: COLONOSCOPY;  Surgeon: Rogene Houston, MD;  [facility];  Service: Endoscopy;  Laterality: N/A;  230  . ESOPHAGOGASTRODUODENOSCOPY N/A 12/08/2016   Procedure: ESOPHAGOGASTRODUODENOSCOPY (EGD);  Surgeon: Rogene Houston, MD;  [facility];  Service: Endoscopy;  Laterality: N/A;  120  . EYE SURGERY     removed cyst from right eye in june 2015. sept 9th surgery for eye lid drop on both eyes  . JOINT REPLACEMENT    . REPLACEMENT TOTAL KNEE Right 2000  . TOTAL KNEE ARTHROPLASTY Left 12/27/2015   Procedure: LEFT  TOTAL KNEE ARTHROPLASTY;  Surgeon: Gaynelle Arabian, MD;  [facility];  Service: Orthopedics;  Laterality: Left;  . TUBAL LIGATION      There were no vitals filed for this visit.      Subjective Assessment - 05/07/17 1733    Subjective MD has not contacted PT clinic or patient.  PT reports pain in Lt shoulder of 5/10 and increases wtih certain activities. PT presents to therapy without her sling this session but states she wears it at home.                          [facility] Adult PT Treatment/Exercise - 05/07/17 0001      Shoulder Exercises: Supine   Flexion AAROM;PROM   Other Supine Exercises supine:wand flexion, AROM IR/ER   Other Supine Exercises PROM for flexion, abduction, IR/ER     Shoulder Exercises: Seated   Elevation Both;10 reps     Shoulder Exercises: Pulleys   Flexion Other (comment)   Flexion Limitations 5 reps   ABduction Other (comment)   ABduction Limitations 5 reps     Shoulder Exercises: Stretch   Other Shoulder Stretches PROM for flexion , abd, IR/ER     Manual Therapy   Manual Therapy Soft tissue mobilization   Manual therapy comments Performed separatelty from all other interventions at  end of session   Soft tissue mobilization Lt upper trap and shoulder musculature                PT Education - 05/07/17 1738    Education provided Yes   Education Details review of evaluation goals, intiation of HEP   Methods Explanation;Demonstration;Tactile cues;Verbal cues;Handout   Comprehension Verbalized understanding;Returned demonstration;Verbal cues required;Tactile cues required          PT Short Term Goals - 05/04/17 1217      PT SHORT TERM GOAL #1   Title Pt will be independent with HEP and perform consistently to maximize her return to PLOF and decrease risk of reinjury.   Time 4   Period Weeks   Status New     PT SHORT TERM GOAL #2   Title Pt will be able to perform L shoulder flexion and abd AROM to at least 90 deg to  decrease pain and maximzie overall function at home.   Time 4   Period Weeks   Status New     PT SHORT TERM GOAL #3   Title Pt will report being able to sleep through the night without awakening due to pain in order to maximize recovery.   Time 4   Period Weeks   Status New           PT Long Term Goals - 05/04/17 1230      PT LONG TERM GOAL #1   Title Pt will have L shoulder flexion and abd AROM to at least 150 deg each without pain in order to maximize pt's ability to dress and bathe.   Time 8   Period Weeks   Status New     PT LONG TERM GOAL #2   Title Pt will have at least 4+/5 strength in LUE in order to maximize her ability to perform ADLs and IADLs at home with no pain.   Time 8   Period Weeks   Status New     PT LONG TERM GOAL #3   Title Pt will have an improvement in her QuickDASH by 8% or > to demonstrate improved overall perceived function.   Time 8   Period Weeks   Status New               Plan - 05/07/17 1740    Clinical Impression Statement Pt wtihout sling, remains intolerable to motion and very guarded.  Intiated soft tissue to upper trap and shoulder musculature to induce relaxation to area.  Pt given AAROM wand and shoulder elevation exercises to begin at home.  PT instructed to only complete exericses that are not painful and remain in that range of motion.  PT verbalized understanding and correct performance of activities.     Rehab Potential Fair   PT Frequency 2x / week   PT Duration 8 weeks   PT Treatment/Interventions ADLs/Self Care Home Management;Cryotherapy;Electrical Stimulation;Iontophoresis 4mg /ml Dexamethasone;Moist Heat;Ultrasound;Therapeutic activities;Therapeutic exercise;Balance training;Neuromuscular re-education;Patient/family education;Manual techniques;Passive range of motion;Dry needling;Taping   PT Next Visit Plan continue to Attempt communication with Shelle Iron, PA-C (her referring physician) to inquire about sling useage.   Continue manual to shoulder musculature for pain management, PROM in all directions, AAROM with wand, trial sidelying AROM.   PT Home Exercise Plan issue next session   Consulted and Agree with Plan of Care Patient      Patient will benefit from skilled therapeutic intervention in order to improve the following deficits and impairments:  Decreased activity tolerance,  Decreased knowledge of precautions, Decreased range of motion, Decreased strength, Increased muscle spasms, Impaired perceived functional ability, Impaired UE functional use, Postural dysfunction, Pain  Visit Diagnosis: Acute pain of left shoulder  Other symptoms and signs involving the musculoskeletal system  Muscle weakness (generalized)  Abnormal posture     Problem List Patient Active Problem List   Diagnosis Date Noted  . Melena 12/05/2016  . Family hx of colon cancer 06/08/2016  . OA (osteoarthritis) of knee 12/27/2015  . Screening for osteoporosis 02/25/2014  . 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 Allergic reaction 05/13/2013  . Hyperlipemia 04/24/2013  . Hypertension 04/24/2013   Teena Irani, PTA/CLT [phone]  Teena Irani 05/07/2017, 5:48 PM  [facility] [address] [place], [place], [address] Phone: [phone]   Fax:  [phone]  Name: [name] MRN: [id number] Date of Birth: 10/05/1941

## 2017-05-08 ENCOUNTER — Telehealth (HOSPITAL_COMMUNITY): Payer: Self-pay | Admitting: Physical Therapy

## 2017-05-08 NOTE — Telephone Encounter (Signed)
Left another message with [facility] regarding results of imaging and use of sling for Lt UE.  REquested to return call. Teena Irani, PTA/CLT [phone]

## 2017-05-09 ENCOUNTER — Ambulatory Visit (HOSPITAL_COMMUNITY): Payer: Medicare Other | Admitting: Physical Therapy

## 2017-05-09 ENCOUNTER — Other Ambulatory Visit: Payer: Self-pay | Admitting: Family Medicine

## 2017-05-09 DIAGNOSIS — R29898 Other symptoms and signs involving the musculoskeletal system: Secondary | ICD-10-CM | POA: Diagnosis not present

## 2017-05-09 DIAGNOSIS — M25512 Pain in left shoulder: Secondary | ICD-10-CM

## 2017-05-09 DIAGNOSIS — M6281 Muscle weakness (generalized): Secondary | ICD-10-CM

## 2017-05-09 DIAGNOSIS — E785 Hyperlipidemia, unspecified: Secondary | ICD-10-CM | POA: Diagnosis not present

## 2017-05-09 DIAGNOSIS — R293 Abnormal posture: Secondary | ICD-10-CM

## 2017-05-09 DIAGNOSIS — I10 Essential (primary) hypertension: Secondary | ICD-10-CM | POA: Diagnosis not present

## 2017-05-09 DIAGNOSIS — Z1231 Encounter for screening mammogram for malignant neoplasm of breast: Secondary | ICD-10-CM

## 2017-05-09 NOTE — Therapy (Signed)
[place], [place], [address] Phone: [phone]   Fax:  [phone]  Physical Therapy Treatment  Patient Details  Name: [name] MRN: [id number] Date of Birth: Jul 27, 1941 Referring Provider: Dr. Theda Sers  Encounter Date: 05/09/2017      PT End of Session - 05/09/17 0851    Visit Number 3   Number of Visits 17   Date for PT Re-Evaluation 06/01/17   Authorization Type [employer]HC Medicare   Authorization Time Period 05/04/17 to 06/29/17   Authorization - Visit Number 3   Authorization - Number of Visits 10   PT Start Time 0819   PT Stop Time 0900   PT Time Calculation (min) 41 min   Activity Tolerance Patient tolerated treatment well;Patient limited by pain   Behavior During Therapy [facility] for tasks assessed/performed      Past Medical History:  Diagnosis Date  . Allergy   . Angioedema   . Arthritis   . GERD (gastroesophageal reflux disease)   . Headache   . Hx of echocardiogram 10/2005   normal  . Hyperlipidemia   . Hypertension   . Thyroid disease    treated with radiation in 1980s     Past Surgical History:  Procedure Laterality Date  . ABDOMINAL HYSTERECTOMY    . CHOLECYSTECTOMY    . COLONOSCOPY  06/15/2011   Procedure: COLONOSCOPY;  Surgeon: Rogene Houston, MD;  [facility];  Service: Endoscopy;  Laterality: N/A;  . COLONOSCOPY N/A 07/27/2016   Procedure: COLONOSCOPY;  Surgeon: Rogene Houston, MD;  [facility];  Service: Endoscopy;  Laterality: N/A;  230  . ESOPHAGOGASTRODUODENOSCOPY N/A 12/08/2016   Procedure: ESOPHAGOGASTRODUODENOSCOPY (EGD);  Surgeon: Rogene Houston, MD;  [facility];  Service: Endoscopy;  Laterality: N/A;  120  . EYE SURGERY     removed cyst from right eye in june 2015. sept 9th surgery for eye lid drop on both eyes  . JOINT REPLACEMENT    . REPLACEMENT TOTAL KNEE Right 2000  . TOTAL KNEE ARTHROPLASTY Left 12/27/2015   Procedure: LEFT  TOTAL KNEE ARTHROPLASTY;  Surgeon: Gaynelle Arabian, MD;  [facility];  Service: Orthopedics;  Laterality: Left;  . TUBAL LIGATION      There were no vitals filed for this visit.      Subjective Assessment - 05/09/17 0823    Subjective Pt reports no pain unless moves a certain way. Pt states she's been working on it.   Per Jerrell Mylar, secretary: Jerene Pitch called Dr. Netta Cedars' office and so Judeen Hammans called back.... Per Dr. Veverly Fells she can wean from her sling, do gentle exercises for ADLs    Currently in Pain? No/denies                         [facility] Adult PT Treatment/Exercise - 05/09/17 0001      Shoulder Exercises: Supine   Flexion AAROM;PROM   Other Supine Exercises supine:wand flexion, AROM IR/ER   Other Supine Exercises PROM for flexion, abduction, IR/ER     Shoulder Exercises: Sidelying   External Rotation AROM;Left;10 reps     Shoulder Exercises: Pulleys   Flexion 1 minute   ABduction 1 minute     Shoulder Exercises: Stretch   Other Shoulder Stretches PROM for flexion , abd, IR/ER     Modalities   Modalities Cryotherapy     Cryotherapy   Number Minutes Cryotherapy 8 Minutes  Cryotherapy Location Shoulder   Type of Cryotherapy Ice pack     Manual Therapy   Manual Therapy Soft tissue mobilization   Manual therapy comments Performed separatelty from all other interventions at end of session   Soft tissue mobilization Lt upper trap and shoulder musculature                PT Education - 05/09/17 0855    Education provided Yes   Education Details educated to wean sling per MD and continue AAROM.  Use of ice to help wtih pain.   Person(s) Educated Patient   Methods Explanation   Comprehension Verbalized understanding          PT Short Term Goals - 05/04/17 1217      PT SHORT TERM GOAL #1   Title Pt will be independent with HEP and perform consistently to maximize her return to PLOF and decrease risk of reinjury.   Time 4   Period  Weeks   Status New     PT SHORT TERM GOAL #2   Title Pt will be able to perform L shoulder flexion and abd AROM to at least 90 deg to decrease pain and maximzie overall function at home.   Time 4   Period Weeks   Status New     PT SHORT TERM GOAL #3   Title Pt will report being able to sleep through the night without awakening due to pain in order to maximize recovery.   Time 4   Period Weeks   Status New           PT Long Term Goals - 05/04/17 1230      PT LONG TERM GOAL #1   Title Pt will have L shoulder flexion and abd AROM to at least 150 deg each without pain in order to maximize pt's ability to dress and bathe.   Time 8   Period Weeks   Status New     PT LONG TERM GOAL #2   Title Pt will have at least 4+/5 strength in LUE in order to maximize her ability to perform ADLs and IADLs at home with no pain.   Time 8   Period Weeks   Status New     PT LONG TERM GOAL #3   Title Pt will have an improvement in her QuickDASH by 8% or > to demonstrate improved overall perceived function.   Time 8   Period Weeks   Status New               Plan - 05/09/17 6606    Clinical Impression Statement Pt with slighttly improved motion with less guarding and pain behaviors as compared to last session.  Pt reports she has been working on it at home.  Continued with AAROM/PROM to Lt shoulder.  Pt able to withstand approx 1/2 ROM in each direction before stopped with pain.  Finished out session with icepack to help decrease pain/symptoms (not included in billing).     Rehab Potential Fair   PT Frequency 2x / week   PT Duration 8 weeks   PT Treatment/Interventions ADLs/Self Care Home Management;Cryotherapy;Electrical Stimulation;Iontophoresis 4mg /ml Dexamethasone;Moist Heat;Ultrasound;Therapeutic activities;Therapeutic exercise;Balance training;Neuromuscular re-education;Patient/family education;Manual techniques;Passive range of motion;Dry needling;Taping   PT Next Visit Plan  Continue manual to shoulder musculature for pain management, PROM in all directions, AAROM. Progress according to pain tolerance.   PT Home Exercise Plan 7/18:  AROM wand flexion, IR, pendulums, shoulder rolls   Consulted and Agree  with Plan of Care Patient      Patient will benefit from skilled therapeutic intervention in order to improve the following deficits and impairments:  Decreased activity tolerance, Decreased knowledge of precautions, Decreased range of motion, Decreased strength, Increased muscle spasms, Impaired perceived functional ability, Impaired UE functional use, Postural dysfunction, Pain  Visit Diagnosis: Acute pain of left shoulder  Other symptoms and signs involving the musculoskeletal system  Muscle weakness (generalized)  Abnormal posture     Problem List Patient Active Problem List   Diagnosis Date Noted  . Melena 12/05/2016  . Family hx of colon cancer 06/08/2016  . OA (osteoarthritis) of knee 12/27/2015  . Screening for osteoporosis 02/25/2014  . 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 Allergic reaction 05/13/2013  . Hyperlipemia 04/24/2013  . Hypertension 04/24/2013    Teena Irani, PTA/CLT [phone]  05/09/2017, 9:02 AM  [place], [place], [address] Phone: [phone]   Fax:  [phone]  Name: [name] MRN: [id number] Date of Birth: May 18, 1941

## 2017-05-09 NOTE — Patient Instructions (Signed)
ROM: External / Internal Rotation - Wand    Holding wand with left hand palm up, push out from body with other hand, palm down. Keep both elbows bent. When stretch is felt, hold _5_ seconds. Repeat _10_ times per set.    ROM: Flexion - Wand (Supine)    Lie on back holding wand. Raise arms over head. Repeat _10__ times per set. Do _2___ sessions per day.   Pendulum Circular    Bend forward 90 at waist, leaning on table for support. Rock body in a circular pattern to move arm clockwise _10___ times then counterclockwise __10__ times.  Elevation (Active)    Elevate shoulders up and down.  REpeate 10 reps 2X day.

## 2017-05-10 ENCOUNTER — Encounter: Payer: Self-pay | Admitting: Family Medicine

## 2017-05-10 LAB — LIPID PANEL
CHOL/HDL RATIO: 4.2 ratio (ref 0.0–4.4)
CHOLESTEROL TOTAL: 167 mg/dL (ref 100–199)
HDL: 40 mg/dL (ref >39)
LDL CALC: 106 mg/dL — AB (ref 0–99)
Triglycerides: 107 mg/dL (ref 0–149)
VLDL Cholesterol Cal: 21 mg/dL (ref 5–40)

## 2017-05-10 LAB — HEPATIC FUNCTION PANEL
ALT: 10 IU/L (ref 0–32)
AST: 12 IU/L (ref 0–40)
Albumin: 4.4 g/dL (ref 3.5–4.8)
Alkaline Phosphatase: 107 IU/L (ref 39–117)
BILIRUBIN TOTAL: 0.3 mg/dL (ref 0.0–1.2)
Bilirubin, Direct: 0.08 mg/dL (ref 0.00–0.40)
Total Protein: 7 g/dL (ref 6.0–8.5)

## 2017-05-11 ENCOUNTER — Ambulatory Visit (HOSPITAL_COMMUNITY)
Admission: RE | Admit: 2017-05-11 | Discharge: 2017-05-11 | Disposition: A | Discharge status: Home / Self Care | Payer: Medicare Other | Source: Ambulatory Visit | Attending: Family Medicine | Admitting: Family Medicine

## 2017-05-11 DIAGNOSIS — Z1231 Encounter for screening mammogram for malignant neoplasm of breast: Secondary | ICD-10-CM | POA: Insufficient documentation

## 2017-05-14 ENCOUNTER — Ambulatory Visit (HOSPITAL_COMMUNITY): Payer: Medicare Other | Admitting: Physical Therapy

## 2017-05-14 DIAGNOSIS — R29898 Other symptoms and signs involving the musculoskeletal system: Secondary | ICD-10-CM

## 2017-05-14 DIAGNOSIS — M25512 Pain in left shoulder: Secondary | ICD-10-CM

## 2017-05-14 DIAGNOSIS — M6281 Muscle weakness (generalized): Secondary | ICD-10-CM | POA: Diagnosis not present

## 2017-05-14 DIAGNOSIS — R293 Abnormal posture: Secondary | ICD-10-CM

## 2017-05-14 NOTE — Therapy (Signed)
[place], [place], [address] Phone: [phone]   Fax:  [phone]  Physical Therapy Treatment  Patient Details  Name: [name] MRN: [id number] Date of Birth: [birth year] Referring Provider: Dr. Theda Sers  Encounter Date: 05/14/2017      PT End of Session - 05/14/17 1647    Visit Number 4   Number of Visits 17   Date for PT Re-Evaluation 06/01/17   Authorization Type [employer] Medicare   Authorization Time Period 05/04/17 to 06/29/17   Authorization - Visit Number 4   Authorization - Number of Visits 10   PT Start Time 5400   PT Stop Time 1641   PT Time Calculation (min) 38 min   Activity Tolerance Patient tolerated treatment well   Behavior During Therapy [facility] for tasks assessed/performed      Past Medical History:  Diagnosis Date  . Allergy   . Angioedema   . Arthritis   . GERD (gastroesophageal reflux disease)   . Headache   . Hx of echocardiogram 10/2005   normal  . Hyperlipidemia   . Hypertension   . Thyroid disease    treated with radiation in 1980s     Past Surgical History:  Procedure Laterality Date  . ABDOMINAL HYSTERECTOMY    . CHOLECYSTECTOMY    . COLONOSCOPY  06/15/2011   Procedure: COLONOSCOPY;  Surgeon: Rogene Houston, MD;  [facility];  Service: Endoscopy;  Laterality: N/A;  . COLONOSCOPY N/A 07/27/2016   Procedure: COLONOSCOPY;  Surgeon: Rogene Houston, MD;  [facility];  Service: Endoscopy;  Laterality: N/A;  230  . ESOPHAGOGASTRODUODENOSCOPY N/A 12/08/2016   Procedure: ESOPHAGOGASTRODUODENOSCOPY (EGD);  Surgeon: Rogene Houston, MD;  [facility];  Service: Endoscopy;  Laterality: N/A;  120  . EYE SURGERY     removed cyst from right eye in june 2015. sept 9th surgery for eye lid drop on both eyes  . JOINT REPLACEMENT    . REPLACEMENT TOTAL KNEE Right 2000  . TOTAL KNEE ARTHROPLASTY Left 12/27/2015   Procedure: LEFT TOTAL KNEE ARTHROPLASTY;   Surgeon: Gaynelle Arabian, MD;  [facility];  Service: Orthopedics;  Laterality: Left;  . TUBAL LIGATION      There were no vitals filed for this visit.      Subjective Assessment - 05/14/17 1606    Subjective patient arrives stating that she is feeling better, she does continue to have pain just above her elbow when she brings her arm up    Pertinent History HTN, hyperlipidemia, arthritis, GERD, thyroid disease   Diagnostic tests per pt, she reports she had x-ray with Dr. Theda Sers which was negative for fractures or RTV damage   Patient Stated Goals get 100% back   Currently in Pain? No/denies                         [facility] Adult PT Treatment/Exercise - 05/14/17 0001      Shoulder Exercises: Supine   Flexion PROM;Left;10 reps   ABduction AAROM;Left;15 reps     Shoulder Exercises: Seated   Retraction Both;20 reps   Other Seated Exercises shoulder rolls up back down 1x20; table stretches for flexion and abduction 1x10 with 10 second holds    Other Seated Exercises supine thoracic excursions 1x10 all directions      Shoulder Exercises: Standing   Other Standing Exercises ER stretch at doorframe 10x10 seconds  Manual Therapy   Manual Therapy Soft tissue mobilization   Manual therapy comments Performed separatelty from all other interventions at end of session   Soft tissue mobilization L biceps muscle belly                 PT Education - 05/14/17 1647    Education provided Yes   Education Details thoracic excurisons HEP    Person(s) Educated Patient   Methods Explanation;Demonstration;Handout   Comprehension Verbalized understanding;Returned demonstration;Need further instruction          PT Short Term Goals - 05/04/17 1217      PT SHORT TERM GOAL #1   Title Pt will be independent with HEP and perform consistently to maximize her return to PLOF and decrease risk of reinjury.   Time 4   Period Weeks   Status New     PT SHORT TERM GOAL  #2   Title Pt will be able to perform L shoulder flexion and abd AROM to at least 90 deg to decrease pain and maximzie overall function at home.   Time 4   Period Weeks   Status New     PT SHORT TERM GOAL #3   Title Pt will report being able to sleep through the night without awakening due to pain in order to maximize recovery.   Time 4   Period Weeks   Status New           PT Long Term Goals - 05/04/17 1230      PT LONG TERM GOAL #1   Title Pt will have L shoulder flexion and abd AROM to at least 150 deg each without pain in order to maximize pt's ability to dress and bathe.   Time 8   Period Weeks   Status New     PT LONG TERM GOAL #2   Title Pt will have at least 4+/5 strength in LUE in order to maximize her ability to perform ADLs and IADLs at home with no pain.   Time 8   Period Weeks   Status New     PT LONG TERM GOAL #3   Title Pt will have an improvement in her QuickDASH by 8% or > to demonstrate improved overall perceived function.   Time 8   Period Weeks   Status New               Plan - 05/14/17 1648    Clinical Impression Statement Patient arrives stating she continues to have pain in her arm when she moves a certain way. Performed some flexion PROM before targeting painful area in biceps region with STM, after which patient reports reduced pain with shoulder motion. Performed shoulder ABD AAROM with tactile cues for correct form, followed by scapular stabilization/postural training and ongoing general shoulder mobility based tasks today.    Rehab Potential Fair   PT Frequency 2x / week   PT Duration 8 weeks   PT Treatment/Interventions ADLs/Self Care Home Management;Cryotherapy;Electrical Stimulation;Iontophoresis 4mg /ml Dexamethasone;Moist Heat;Ultrasound;Therapeutic activities;Therapeutic exercise;Balance training;Neuromuscular re-education;Patient/family education;Manual techniques;Passive range of motion;Dry needling;Taping   PT Next Visit Plan  Continue manual to shoulder and biceps musculature for pain management, PROM in all directions, AAROM. Progress according to pain tolerance.   PT Home Exercise Plan 7/18:  AROM wand flexion, IR, pendulums, shoulder rolls 7/23 3D thoracic excursinos    Consulted and Agree with Plan of Care Patient      Patient will benefit from skilled therapeutic intervention in order to  improve the following deficits and impairments:  Decreased activity tolerance, Decreased knowledge of precautions, Decreased range of motion, Decreased strength, Increased muscle spasms, Impaired perceived functional ability, Impaired UE functional use, Postural dysfunction, Pain  Visit Diagnosis: Acute pain of left shoulder  Other symptoms and signs involving the musculoskeletal system  Muscle weakness (generalized)  Abnormal posture     Problem List Patient Active Problem List   Diagnosis Date Noted  . Melena 12/05/2016  . Family hx of colon cancer 06/08/2016  . OA (osteoarthritis) of knee 12/27/2015  . Screening for osteoporosis 02/25/2014  . 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 Allergic reaction 05/13/2013  . Hyperlipemia 04/24/2013  . Hypertension 04/24/2013    Deniece Ree PT, DPT [facility] [address] [place], [place], [address] Phone: [phone]   Fax:  [phone]  Name: [name] MRN: [id number] Date of Birth: October 07, 1941

## 2017-05-16 ENCOUNTER — Ambulatory Visit (HOSPITAL_COMMUNITY): Payer: Medicare Other

## 2017-05-16 ENCOUNTER — Encounter (HOSPITAL_COMMUNITY): Payer: Self-pay

## 2017-05-16 DIAGNOSIS — M25512 Pain in left shoulder: Secondary | ICD-10-CM

## 2017-05-16 DIAGNOSIS — R293 Abnormal posture: Secondary | ICD-10-CM

## 2017-05-16 DIAGNOSIS — R29898 Other symptoms and signs involving the musculoskeletal system: Secondary | ICD-10-CM | POA: Diagnosis not present

## 2017-05-16 DIAGNOSIS — M6281 Muscle weakness (generalized): Secondary | ICD-10-CM

## 2017-05-16 NOTE — Patient Instructions (Signed)
  Grasp one side of the door with your Left hand. Slowly step through the doorway until a stretch is felt near the front of each shoulder/down the front of your arm.  To release stretch, step back out of doorway.  Perform with your Left arm only and stand a little closer to the doorframe. Just be sure to keep your shoulders straight ahead/don't let them turn.  Perform 1-2x/day, 3-5 stretches holding for 15-30 seconds

## 2017-05-16 NOTE — Therapy (Signed)
[place], [place], [address] Phone: [phone]   Fax:  [phone]  Physical Therapy Treatment  Patient Details  Name: [name] MRN: [id number] Date of Birth: 1941-08-25 Referring Provider: Dr. Theda Sers  Encounter Date: 05/16/2017      PT End of Session - 05/16/17 1606    Visit Number 5   Number of Visits 17   Date for PT Re-Evaluation 06/01/17   Authorization Type U[employer] Medicare   Authorization Time Period 05/04/17 to 06/29/17   Authorization - Visit Number 5   Authorization - Number of Visits 10   PT Start Time 1606   PT Stop Time 1650   PT Time Calculation (min) 44 min   Activity Tolerance Patient tolerated treatment well   Behavior During Therapy [facility] for tasks assessed/performed      Past Medical History:  Diagnosis Date  . Allergy   . Angioedema   . Arthritis   . GERD (gastroesophageal reflux disease)   . Headache   . Hx of echocardiogram 10/2005   normal  . Hyperlipidemia   . Hypertension   . Thyroid disease    treated with radiation in 1980s     Past Surgical History:  Procedure Laterality Date  . ABDOMINAL HYSTERECTOMY    . CHOLECYSTECTOMY    . COLONOSCOPY  06/15/2011   Procedure: COLONOSCOPY;  Surgeon: Rogene Houston, MD;  [facility];  Service: Endoscopy;  Laterality: N/A;  . COLONOSCOPY N/A 07/27/2016   Procedure: COLONOSCOPY;  Surgeon: Rogene Houston, MD;  [facility];  Service: Endoscopy;  Laterality: N/A;  230  . ESOPHAGOGASTRODUODENOSCOPY N/A 12/08/2016   Procedure: ESOPHAGOGASTRODUODENOSCOPY (EGD);  Surgeon: Rogene Houston, MD;  [facility];  Service: Endoscopy;  Laterality: N/A;  120  . EYE SURGERY     removed cyst from right eye in june 2015. sept 9th surgery for eye lid drop on both eyes  . JOINT REPLACEMENT    . REPLACEMENT TOTAL KNEE Right 2000  . TOTAL KNEE ARTHROPLASTY Left 12/27/2015   Procedure: LEFT TOTAL KNEE ARTHROPLASTY;   Surgeon: Gaynelle Arabian, MD;  [facility]: [facility];  Service: Orthopedics;  Laterality: Left;  . TUBAL LIGATION      There were no vitals filed for this visit.      Subjective Assessment - 05/16/17 1607    Subjective Pt states that she feels better. She states that she is a little sore from Monday but she feels like she can drive better.   Pertinent History HTN, hyperlipidemia, arthritis, GERD, thyroid disease   Diagnostic tests per pt, she reports she had x-ray with Dr. Theda Sers which was negative for fractures or RTV damage   Patient Stated Goals get 100% back   Currently in Pain? No/denies             [facility] Adult PT Treatment/Exercise - 05/16/17 0001      Shoulder Exercises: Supine   External Rotation PROM;Left;15 reps   Flexion PROM;Left;12 reps     Shoulder Exercises: Sidelying   Flexion AAROM;Left;5 reps  cues for form; long and short lever arm     Shoulder Exercises: Pulleys   Flexion 3 minutes   Flexion Limitations cues for form   ABduction 3 minutes   ABduction Limitations cues for form     Shoulder Exercises: Stretch   Other Shoulder Stretches L biceps stretch in doorway (hand low on doorframe) 2 x 15 sec  Manual Therapy   Manual Therapy Soft tissue mobilization;Joint mobilization   Manual therapy comments Performed separatelty from all other interventions at end of session   Joint Mobilization Grade I-II posterior joint mobs for flexion; manual traction    Soft tissue mobilization L biceps muscle belly              PT Education - 05/16/17 1651    Education provided Yes   Education Details added biceps stretch in doorway, encouraged to get pulleys to help with ROM while on vacation   Person(s) Educated Patient   Methods Explanation;Demonstration;Handout   Comprehension Verbalized understanding;Returned demonstration          PT Short Term Goals - 05/04/17 1217      PT SHORT TERM GOAL #1   Title Pt will be independent with HEP and perform  consistently to maximize her return to PLOF and decrease risk of reinjury.   Time 4   Period Weeks   Status New     PT SHORT TERM GOAL #2   Title Pt will be able to perform L shoulder flexion and abd AROM to at least 90 deg to decrease pain and maximzie overall function at home.   Time 4   Period Weeks   Status New     PT SHORT TERM GOAL #3   Title Pt will report being able to sleep through the night without awakening due to pain in order to maximize recovery.   Time 4   Period Weeks   Status New           PT Long Term Goals - 05/04/17 1230      PT LONG TERM GOAL #1   Title Pt will have L shoulder flexion and abd AROM to at least 150 deg each without pain in order to maximize pt's ability to dress and bathe.   Time 8   Period Weeks   Status New     PT LONG TERM GOAL #2   Title Pt will have at least 4+/5 strength in LUE in order to maximize her ability to perform ADLs and IADLs at home with no pain.   Time 8   Period Weeks   Status New     PT LONG TERM GOAL #3   Title Pt will have an improvement in her QuickDASH by 8% or > to demonstrate improved overall perceived function.   Time 8   Period Weeks   Status New               Plan - 05/16/17 1652    Clinical Impression Statement Pt states that she is continuing to feel better. During session, pt became upset and somber regarding her late husband as today would have been their anniversary. Because pt was upset and tearful throughout session, did not push pt too hard, just focused on PROM and decreasing pain. She did well with Grade I-II posterior joint mobs and reported that manual traction felt good. Pt will not be here next week due to being on vacation so PT provided pt with biceps stretch to add to HEP and encouraged pt to consider getting pulley's to help with improving ROM. She verbalized understanding.   Rehab Potential Fair   PT Frequency 2x / week   PT Duration 8 weeks   PT Treatment/Interventions  ADLs/Self Care Home Management;Cryotherapy;Electrical Stimulation;Iontophoresis 4mg /ml Dexamethasone;Moist Heat;Ultrasound;Therapeutic activities;Therapeutic exercise;Balance training;Neuromuscular re-education;Patient/family education;Manual techniques;Passive range of motion;Dry needling;Taping   PT Next Visit Plan Continue manual to shoulder  and biceps musculature for pain management; continue mobs and traction for pain; PROM in all directions, AAROM. Progress according to pain tolerance; sidelying flexion and abd with emphasis on decreasing shoulder hike   PT Home Exercise Plan 7/18:  AROM wand flexion, IR, pendulums, shoulder rolls 7/23 3D thoracic excursions; 7/24: biceps stretch in doorway, pulleys   Consulted and Agree with Plan of Care Patient      Patient will benefit from skilled therapeutic intervention in order to improve the following deficits and impairments:  Decreased activity tolerance, Decreased knowledge of precautions, Decreased range of motion, Decreased strength, Increased muscle spasms, Impaired perceived functional ability, Impaired UE functional use, Postural dysfunction, Pain  Visit Diagnosis: Acute pain of left shoulder  Other symptoms and signs involving the musculoskeletal system  Muscle weakness (generalized)  Abnormal posture     Problem List Patient Active Problem List   Diagnosis Date Noted  . Melena 12/05/2016  . Family hx of colon cancer 06/08/2016  . OA (osteoarthritis) of knee 12/27/2015  . Screening for osteoporosis 02/25/2014  . 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 Allergic reaction 05/13/2013  . Hyperlipemia 04/24/2013  . Hypertension 04/24/2013     Geraldine Solar PT, DPT  [place] [address] [place], [place], [address] Phone: [phone]   Fax:  [phone]  Name: [name] MRN: [id number] Date of Birth: December 03, 1940

## 2017-05-29 ENCOUNTER — Ambulatory Visit (HOSPITAL_COMMUNITY): Payer: Medicare Other | Attending: Physician Assistant

## 2017-05-29 DIAGNOSIS — R29898 Other symptoms and signs involving the musculoskeletal system: Secondary | ICD-10-CM | POA: Diagnosis not present

## 2017-05-29 DIAGNOSIS — M6281 Muscle weakness (generalized): Secondary | ICD-10-CM | POA: Insufficient documentation

## 2017-05-29 DIAGNOSIS — R293 Abnormal posture: Secondary | ICD-10-CM | POA: Insufficient documentation

## 2017-05-29 DIAGNOSIS — M25512 Pain in left shoulder: Secondary | ICD-10-CM | POA: Insufficient documentation

## 2017-05-29 NOTE — Therapy (Signed)
[place], [place], [address] Phone: [phone]   Fax:  [phone]  Physical Therapy Treatment  Patient Details  Name: [name] MRN: [id number] Date of Birth: 01-22-1941 Referring Provider: Dr. Theda Sers  Encounter Date: 05/29/2017      PT End of Session - 05/29/17 1355    Visit Number 6   Number of Visits 17   Date for PT Re-Evaluation 06/01/17   Authorization Type UHC Medicare   Authorization Time Period 05/04/17 to 06/29/17   Authorization - Visit Number 6   Authorization - Number of Visits 10   PT Start Time 5277   PT Stop Time 1431   PT Time Calculation (min) 40 min   Activity Tolerance Patient tolerated treatment well;No increased pain   Behavior During Therapy WFL for tasks assessed/performed      Past Medical History:  Diagnosis Date  . Allergy   . Angioedema   . Arthritis   . GERD (gastroesophageal reflux disease)   . Headache   . Hx of echocardiogram 10/2005   normal  . Hyperlipidemia   . Hypertension   . Thyroid disease    treated with radiation in 1980s     Past Surgical History:  Procedure Laterality Date  . ABDOMINAL HYSTERECTOMY    . CHOLECYSTECTOMY    . COLONOSCOPY  06/15/2011   Procedure: COLONOSCOPY;  Surgeon: Rogene Houston, MD;  [facility];  Service: Endoscopy;  Laterality: N/A;  . COLONOSCOPY N/A 07/27/2016   Procedure: COLONOSCOPY;  Surgeon: Rogene Houston, MD;  [facility];  Service: Endoscopy;  Laterality: N/A;  230  . ESOPHAGOGASTRODUODENOSCOPY N/A 12/08/2016   Procedure: ESOPHAGOGASTRODUODENOSCOPY (EGD);  Surgeon: Rogene Houston, MD;  [facility];  Service: Endoscopy;  Laterality: N/A;  120  . EYE SURGERY     removed cyst from right eye in june 2015. sept 9th surgery for eye lid drop on both eyes  . JOINT REPLACEMENT    . REPLACEMENT TOTAL KNEE Right 2000  . TOTAL KNEE ARTHROPLASTY Left 12/27/2015   Procedure: LEFT TOTAL KNEE  ARTHROPLASTY;  Surgeon: Gaynelle Arabian, MD;  [facility];  Service: Orthopedics;  Laterality: Left;  . TUBAL LIGATION      There were no vitals filed for this visit.      Subjective Assessment - 05/29/17 1353    Subjective Pt reports she feels her shoulder is progressing well.  Reports she continued her shoulder ROM while on cruise.  Continues to have difficutly raising arm in air.   Pertinent History HTN, hyperlipidemia, arthritis, GERD, thyroid disease   Patient Stated Goals get 100% back   Currently in Pain? No/denies                         [facility] Adult PT Treatment/Exercise - 05/29/17 0001      Shoulder Exercises: Supine   Flexion PROM;Left;12 reps   ABduction AAROM;Left;15 reps     Shoulder Exercises: Sidelying   External Rotation AROM;Left;15 reps   Flexion AAROM;15 reps   ABduction AAROM;5 reps   ABduction Limitations cueing for form     Shoulder Exercises: Pulleys   Flexion 3 minutes   Flexion Limitations cues for form   ABduction 3 minutes   ABduction Limitations cues for form     Manual Therapy   Manual Therapy Soft tissue mobilization;Passive ROM   Manual therapy comments Performed separatelty from all other  interventions at end of session   Soft tissue mobilization L biceps muscle belly, pec major, tricep proximal   Passive ROM PROM supine flexion and abduction 10x with 10" holds at end range                  PT Short Term Goals - 05/04/17 1217      PT SHORT TERM GOAL #1   Title Pt will be independent with HEP and perform consistently to maximize her return to PLOF and decrease risk of reinjury.   Time 4   Period Weeks   Status New     PT SHORT TERM GOAL #2   Title Pt will be able to perform L shoulder flexion and abd AROM to at least 90 deg to decrease pain and maximzie overall function at home.   Time 4   Period Weeks   Status New     PT SHORT TERM GOAL #3   Title Pt will report being able to sleep through the night  without awakening due to pain in order to maximize recovery.   Time 4   Period Weeks   Status New           PT Long Term Goals - 05/04/17 1230      PT LONG TERM GOAL #1   Title Pt will have L shoulder flexion and abd AROM to at least 150 deg each without pain in order to maximize pt's ability to dress and bathe.   Time 8   Period Weeks   Status New     PT LONG TERM GOAL #2   Title Pt will have at least 4+/5 strength in LUE in order to maximize her ability to perform ADLs and IADLs at home with no pain.   Time 8   Period Weeks   Status New     PT LONG TERM GOAL #3   Title Pt will have an improvement in her QuickDASH by 8% or > to demonstrate improved overall perceived function.   Time 8   Period Weeks   Status New               Plan - 05/29/17 1611    Clinical Impression Statement Pt reports she is feeling improvements every session with shoulder mobility and decreased pain.  Continued session foucs on shoulder mobility and AAROM for strengthening.  Pt continues to require cueing to reduce shoulder elevation with abduction movements, AAROM to improve form.  Pt able to complete whole session with no reports of pain.  Noted tightness in pecs major, bicep and proximal triceps, manual soft tissue mobilizaiton complete to assist.     Rehab Potential Fair   PT Frequency 2x / week   PT Duration 8 weeks   PT Treatment/Interventions ADLs/Self Care Home Management;Cryotherapy;Electrical Stimulation;Iontophoresis 4mg /ml Dexamethasone;Moist Heat;Ultrasound;Therapeutic activities;Therapeutic exercise;Balance training;Neuromuscular re-education;Patient/family education;Manual techniques;Passive range of motion;Dry needling;Taping   PT Next Visit Plan Continue manual to shoulder and biceps musculature for pain management; continue mobs and traction for pain; PROM in all directions, AAROM. Progress according to pain tolerance; sidelying flexion and abd with emphasis on decreasing shoulder  hike.  Next session begin slides (seated or standing) for mobility.   PT Home Exercise Plan 7/18:  AROM wand flexion, IR, pendulums, shoulder rolls 7/23 3D thoracic excursions; 7/24: biceps stretch in doorway, pulleys      Patient will benefit from skilled therapeutic intervention in order to improve the following deficits and impairments:  Decreased activity tolerance, Decreased knowledge  of precautions, Decreased range of motion, Decreased strength, Increased muscle spasms, Impaired perceived functional ability, Impaired UE functional use, Postural dysfunction, Pain  Visit Diagnosis: Acute pain of left shoulder  Other symptoms and signs involving the musculoskeletal system  Muscle weakness (generalized)  Abnormal posture     Problem List Patient Active Problem List   Diagnosis Date Noted  . Melena 12/05/2016  . Family hx of colon cancer 06/08/2016  . OA (osteoarthritis) of knee 12/27/2015  . Screening for osteoporosis 02/25/2014  . 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 Allergic reaction 05/13/2013  . Hyperlipemia 04/24/2013  . Hypertension 04/24/2013   Ihor Austin, [place]; [place]  Aldona Lento 05/29/2017, 4:20 PM  [place] [address] [place], [place], [address] Phone: [phone]   Fax:  [phone]  Name: [name] MRN: [id number] Date of Birth: 12-Dec-1940

## 2017-05-31 ENCOUNTER — Encounter (HOSPITAL_COMMUNITY): Payer: Self-pay

## 2017-05-31 ENCOUNTER — Ambulatory Visit (HOSPITAL_COMMUNITY): Payer: Medicare Other

## 2017-05-31 DIAGNOSIS — R293 Abnormal posture: Secondary | ICD-10-CM | POA: Diagnosis not present

## 2017-05-31 DIAGNOSIS — M25512 Pain in left shoulder: Secondary | ICD-10-CM | POA: Diagnosis not present

## 2017-05-31 DIAGNOSIS — R29898 Other symptoms and signs involving the musculoskeletal system: Secondary | ICD-10-CM | POA: Diagnosis not present

## 2017-05-31 DIAGNOSIS — M6281 Muscle weakness (generalized): Secondary | ICD-10-CM | POA: Diagnosis not present

## 2017-05-31 NOTE — Therapy (Signed)
[place], [place], [address] Phone: [phone]   Fax:  [phone]  Physical Therapy Treatment  Patient Details  Name: [name] MRN: [id number] Date of Birth: June 16, 1941 Referring Provider: Dr. Theda Sers  Encounter Date: 05/31/2017      PT End of Session - 05/31/17 1350    Visit Number 7   Number of Visits 17   Date for PT Re-Evaluation 06/01/17   Authorization Type [employer]HC Medicare   Authorization Time Period 05/04/17 to 06/29/17   Authorization - Visit Number 7   Authorization - Number of Visits 10   PT Start Time 1350   PT Stop Time 1430   PT Time Calculation (min) 40 min   Activity Tolerance Patient tolerated treatment well;No increased pain   Behavior During Therapy WFL for tasks assessed/performed      Past Medical History:  Diagnosis Date  . Allergy   . Angioedema   . Arthritis   . GERD (gastroesophageal reflux disease)   . Headache   . Hx of echocardiogram 10/2005   normal  . Hyperlipidemia   . Hypertension   . Thyroid disease    treated with radiation in 1980s     Past Surgical History:  Procedure Laterality Date  . ABDOMINAL HYSTERECTOMY    . CHOLECYSTECTOMY    . COLONOSCOPY  06/15/2011   Procedure: COLONOSCOPY;  Surgeon: Rogene Houston, MD;  [facility];  Service: Endoscopy;  Laterality: N/A;  . COLONOSCOPY N/A 07/27/2016   Procedure: COLONOSCOPY;  Surgeon: Rogene Houston, MD;  [facility];  Service: Endoscopy;  Laterality: N/A;  230  . ESOPHAGOGASTRODUODENOSCOPY N/A 12/08/2016   Procedure: ESOPHAGOGASTRODUODENOSCOPY (EGD);  Surgeon: Rogene Houston, MD;  [facility];  Service: Endoscopy;  Laterality: N/A;  120  . EYE SURGERY     removed cyst from right eye in june 2015. sept 9th surgery for eye lid drop on both eyes  . JOINT REPLACEMENT    . REPLACEMENT TOTAL KNEE Right 2000  . TOTAL KNEE ARTHROPLASTY Left 12/27/2015   Procedure: LEFT TOTAL KNEE  ARTHROPLASTY;  Surgeon: Gaynelle Arabian, MD;  [facility];  Service: Orthopedics;  Laterality: Left;  . TUBAL LIGATION      There were no vitals filed for this visit.      Subjective Assessment - 05/31/17 1350    Subjective Pt states that her L shoulder is achey/irritating today. It started after her last treatment session and after doing her HEP yesterday.   Pertinent History HTN, hyperlipidemia, arthritis, GERD, thyroid disease   Patient Stated Goals get 100% back   Currently in Pain? Yes   Pain Score 4    Pain Location Shoulder   Pain Orientation Left   Pain Descriptors / Indicators Aching   Pain Type Acute pain   Pain Onset 1 to 4 weeks ago   Pain Frequency Constant   Aggravating Factors  moving it, cold air   Pain Relieving Factors rest, ice   Effect of Pain on Daily Activities increases               [facility] Adult PT Treatment/Exercise - 05/31/17 0001      Shoulder Exercises: Seated   Flexion AAROM;Left;20 reps  with large physioball   Flexion Limitations seated AAROM with wand   Abduction AAROM;Left;10 reps  with wand   Other Seated Exercises UBE fwd/retro x2 mins each, L1     Shoulder Exercises: Sidelying  Flexion Left;15 reps  2 sets, cues for form, short lever arm (elbow bent)   ABduction AAROM;Left;15 reps  2 sets, cues for form, short lever arm     Manual Therapy   Manual Therapy Soft tissue mobilization   Manual therapy comments Performed separatelty from all other interventions at end of session   Soft tissue mobilization L UT, biceps long head and muscle belly, anterior and middle delt             PT Education - 05/31/17 1431    Education provided Yes   Education Details continue HEP, potential RTC involvement due to positive tests   Person(s) Educated Patient   Methods Explanation;Demonstration   Comprehension Verbalized understanding;Returned demonstration          PT Short Term Goals - 05/04/17 1217      PT SHORT TERM GOAL  #1   Title Pt will be independent with HEP and perform consistently to maximize her return to PLOF and decrease risk of reinjury.   Time 4   Period Weeks   Status New     PT SHORT TERM GOAL #2   Title Pt will be able to perform L shoulder flexion and abd AROM to at least 90 deg to decrease pain and maximzie overall function at home.   Time 4   Period Weeks   Status New     PT SHORT TERM GOAL #3   Title Pt will report being able to sleep through the night without awakening due to pain in order to maximize recovery.   Time 4   Period Weeks   Status New           PT Long Term Goals - 05/04/17 1230      PT LONG TERM GOAL #1   Title Pt will have L shoulder flexion and abd AROM to at least 150 deg each without pain in order to maximize pt's ability to dress and bathe.   Time 8   Period Weeks   Status New     PT LONG TERM GOAL #2   Title Pt will have at least 4+/5 strength in LUE in order to maximize her ability to perform ADLs and IADLs at home with no pain.   Time 8   Period Weeks   Status New     PT LONG TERM GOAL #3   Title Pt will have an improvement in her QuickDASH by 8% or > to demonstrate improved overall perceived function.   Time 8   Period Weeks   Status New               Plan - 05/31/17 1432    Clinical Impression Statement Pt presented to therapy with mild increased reports of pain. She did well throughout therapy and was noted to have almost full ROM of L flexion when performing flexion with the wand in sitting. Able to perform RTC testing this date; pt positive for drop arm and painful arc, but negative for belly press and bear hug, indicating some RTC involvement. However, educated pt that RTC is more than likely not fully torn as she can still raise her arm OH. Pt verbalize decreased pain at EOS. Continue POC as planned.   Rehab Potential Fair   PT Frequency 2x / week   PT Duration 8 weeks   PT Treatment/Interventions ADLs/Self Care Home  Management;Cryotherapy;Electrical Stimulation;Iontophoresis 4mg /ml Dexamethasone;Moist Heat;Ultrasound;Therapeutic activities;Therapeutic exercise;Balance training;Neuromuscular re-education;Patient/family education;Manual techniques;Passive range of motion;Dry needling;Taping   PT Next  Visit Plan Continue manual to shoulder and biceps musculature for pain management; continue mobs and traction for pain; PROM in all directions, AAROM. Progress according to pain tolerance; sidelying flexion and abd with emphasis on decreasing shoulder hike.  continue ball rollouts for flexion; add wall walking next session   PT Home Exercise Plan 7/18:  AROM wand flexion, IR, pendulums, shoulder rolls 7/23 3D thoracic excursions; 7/24: biceps stretch in doorway, pulleys   Consulted and Agree with Plan of Care Patient      Patient will benefit from skilled therapeutic intervention in order to improve the following deficits and impairments:  Decreased activity tolerance, Decreased knowledge of precautions, Decreased range of motion, Decreased strength, Increased muscle spasms, Impaired perceived functional ability, Impaired UE functional use, Postural dysfunction, Pain  Visit Diagnosis: Acute pain of left shoulder  Other symptoms and signs involving the musculoskeletal system  Muscle weakness (generalized)  Abnormal posture     Problem List Patient Active Problem List   Diagnosis Date Noted  . Melena 12/05/2016  . Family hx of colon cancer 06/08/2016  . OA (osteoarthritis) of knee 12/27/2015  . Screening for osteoporosis 02/25/2014  . 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 Allergic reaction 05/13/2013  . Hyperlipemia 04/24/2013  . Hypertension 04/24/2013     Geraldine Solar PT, DPT  [place] [address] [place], [place], [address] Phone: [phone]   Fax:  [phone]  Name: BELLE CHARLIE MRN:  [id number] Date of Birth: 1941-01-16

## 2017-06-04 ENCOUNTER — Telehealth (HOSPITAL_COMMUNITY): Payer: Self-pay | Admitting: Physical Therapy

## 2017-06-04 NOTE — Telephone Encounter (Signed)
patient called to move  her August 22nd 1 O'clock appt to 8:15 on thesame day.

## 2017-06-05 ENCOUNTER — Encounter (HOSPITAL_COMMUNITY): Payer: Self-pay

## 2017-06-05 ENCOUNTER — Ambulatory Visit (HOSPITAL_COMMUNITY): Payer: Medicare Other

## 2017-06-05 DIAGNOSIS — R29898 Other symptoms and signs involving the musculoskeletal system: Secondary | ICD-10-CM

## 2017-06-05 DIAGNOSIS — R293 Abnormal posture: Secondary | ICD-10-CM

## 2017-06-05 DIAGNOSIS — M25512 Pain in left shoulder: Secondary | ICD-10-CM

## 2017-06-05 DIAGNOSIS — M6281 Muscle weakness (generalized): Secondary | ICD-10-CM | POA: Diagnosis not present

## 2017-06-05 NOTE — Patient Instructions (Signed)
  External Rotation Door Pivot Stretch  Standing in a door frame, place a small pillow or rolled up towel underneath the elbow of your affected shoulder. Place your wrist on the outside of the door frame, elbow should be bent at 90 degrees, then begin rotating inward toward unaffected shoulder/away from affected shoulder until you feel a stretch in the shoulder.   Perform 1-2x/day with your other exercises, 10-15 stretches holding for 15-30 seconds each.

## 2017-06-05 NOTE — Therapy (Signed)
[place], [place], [address] Phone: [phone]   Fax:  [phone]  Physical Therapy Treatment  Patient Details  Name: [name] MRN: [id number] Date of Birth: December 25, 1940 Referring Provider: Dr. Theda Sers  Encounter Date: 06/05/2017      PT End of Session - 06/05/17 1349    Visit Number 8   Number of Visits 17   Date for PT Re-Evaluation 06/29/17   Authorization Type [employer] Medicare   Authorization Time Period 05/04/17 to 06/29/17   Authorization - Visit Number 8   Authorization - Number of Visits 10   PT Start Time 3794   PT Stop Time 1427   PT Time Calculation (min) 42 min   Activity Tolerance Patient tolerated treatment well;No increased pain   Behavior During Therapy WFL for tasks assessed/performed      Past Medical History:  Diagnosis Date  . Allergy   . Angioedema   . Arthritis   . GERD (gastroesophageal reflux disease)   . Headache   . Hx of echocardiogram 10/2005   normal  . Hyperlipidemia   . Hypertension   . Thyroid disease    treated with radiation in 1980s     Past Surgical History:  Procedure Laterality Date  . ABDOMINAL HYSTERECTOMY    . CHOLECYSTECTOMY    . COLONOSCOPY  06/15/2011   Procedure: COLONOSCOPY;  Surgeon: Rogene Houston, MD;  [facility];  Service: Endoscopy;  Laterality: N/A;  . COLONOSCOPY N/A 07/27/2016   Procedure: COLONOSCOPY;  Surgeon: Rogene Houston, MD;  [facility];  Service: Endoscopy;  Laterality: N/A;  230  . ESOPHAGOGASTRODUODENOSCOPY N/A 12/08/2016   Procedure: ESOPHAGOGASTRODUODENOSCOPY (EGD);  Surgeon: Rogene Houston, MD;  [facility];  Service: Endoscopy;  Laterality: N/A;  120  . EYE SURGERY     removed cyst from right eye in june 2015. sept 9th surgery for eye lid drop on both eyes  . JOINT REPLACEMENT    . REPLACEMENT TOTAL KNEE Right 2000  . TOTAL KNEE ARTHROPLASTY Left 12/27/2015   Procedure: LEFT TOTAL KNEE  ARTHROPLASTY;  Surgeon: Gaynelle Arabian, MD;  [facility];  Service: Orthopedics;  Laterality: Left;  . TUBAL LIGATION      There were no vitals filed for this visit.      Subjective Assessment - 06/05/17 1347    Subjective Pt states that her L arm is "some better." She's still doing her HEP. She states that it continues to wake her up.   Pertinent History HTN, hyperlipidemia, arthritis, GERD, thyroid disease   Patient Stated Goals get 100% back   Currently in Pain? No/denies   Pain Onset 1 to 4 weeks ago            [facility] PT Assessment - 06/05/17 0001      AROM   Overall AROM Comments L AAROM in supine with wand: flexion 128deg, abd 50 deg   Left Shoulder Flexion 53 Degrees  pain in SST region; supine:46 deg; sidelying: 74deg   Left Shoulder ABduction 46 Degrees  pain in SST region; in supine: 55 deg     PROM   Left Shoulder Flexion 120 Degrees   Left Shoulder ABduction 90 Degrees   Left Shoulder Internal Rotation 70 Degrees  was 45 deg; in 3deg abd, no pain   Left Shoulder External Rotation 55 Degrees  was  50; in 35 deg abd, pain     Strength  Right Shoulder Flexion 4+/5  was 4   Right Shoulder ABduction 4+/5  was 4   Right Shoulder Internal Rotation 5/5  wa 4+   Right Shoulder External Rotation 5/5  was 4+   Left Shoulder Internal Rotation 4+/5  pain; was 3+   Left Shoulder External Rotation 4+/5  min pain; was 4-              [facility] Adult PT Treatment/Exercise - 06/05/17 0001      Shoulder Exercises: Stretch   External Rotation Stretch 3 reps;30 seconds  in doorway     Manual Therapy   Manual Therapy Soft tissue mobilization   Manual therapy comments Performed separatelty from all other interventions at end of session   Soft tissue mobilization L distal UT and SST muscle belly and insertion                PT Education - 06/05/17 1431    Education provided Yes   Education Details reassessment findings, added standing doorway ER  stretch to HEP   Person(s) Educated Patient   Methods Explanation;Demonstration;Handout   Comprehension Verbalized understanding;Returned demonstration          PT Short Term Goals - 06/05/17 1349      PT SHORT TERM GOAL #1   Title Pt will be independent with HEP and perform consistently to maximize her return to PLOF and decrease risk of reinjury.   Baseline 8/14: performing 2x/day   Time 4   Period Weeks   Status Achieved     PT SHORT TERM GOAL #2   Title Pt will be able to perform L shoulder flexion and abd AROM to at least 90 deg to decrease pain and maximzie overall function at home.   Time 4   Period Weeks   Status On-going     PT SHORT TERM GOAL #3   Title Pt will report being able to sleep through the night without awakening due to pain in order to maximize recovery.   Baseline 8/14: awakes her 2x/night   Time 4   Period Weeks   Status On-going           PT Long Term Goals - 06/05/17 1350      PT LONG TERM GOAL #1   Title Pt will have L shoulder flexion and abd AROM to at least 150 deg each without pain in order to maximize pt's ability to dress and bathe.   Time 8   Period Weeks   Status On-going     PT LONG TERM GOAL #2   Title Pt will have at least 4+/5 strength in LUE in order to maximize her ability to perform ADLs and IADLs at home with no pain.   Time 8   Period Weeks   Status Partially Met     PT LONG TERM GOAL #3   Title Pt will have an improvement in her QuickDASH by 8% or > to demonstrate improved overall perceived function.   Time 8   Period Weeks   Status Achieved               Plan - 06/05/17 1440    Clinical Impression Statement PT reassessed pt's goals and outcome measures this date. Pt has made good progress towards all goals, however, her pain and AROM are her main limiting factors. Her ROM is slowly improving in all AROM, PROM, and AAROM categories as evidenced by her measurements this date. Pt continues to demo increased  shoulder shrug with elevation and requires cues for correction. She also continues with increased soft tissue restrictions throughout shoulder region and is tender to palpation. Pt given ER stretch as part of HEP this date to improve that ROM in order to promote improved OH elevation ROM. Her QuickDASH significantly improved to 45% indicating Continue POC as planned.    Rehab Potential Fair   PT Frequency 2x / week   PT Duration 8 weeks   PT Treatment/Interventions ADLs/Self Care Home Management;Cryotherapy;Electrical Stimulation;Iontophoresis 52m/ml Dexamethasone;Moist Heat;Ultrasound;Therapeutic activities;Therapeutic exercise;Balance training;Neuromuscular re-education;Patient/family education;Manual techniques;Passive range of motion;Dry needling;Taping   PT Next Visit Plan Continue manual to shoulder and biceps musculature for pain management; continue mobs and traction for pain; PROM in all directions, AAROM. Progress according to pain tolerance; sidelying flexion and abd with emphasis on decreasing shoulder hike.  continue ball rollouts for flexion; add wall walking next session   PT Home Exercise Plan 7/18:  AROM wand flexion, IR, pendulums, shoulder rolls 7/23 3D thoracic excursions; 7/24: biceps stretch in doorway, pulleys; 808-22-2024 ER doorway stretch   Consulted and Agree with Plan of Care Patient      Patient will benefit from skilled therapeutic intervention in order to improve the following deficits and impairments:  Decreased activity tolerance, Decreased knowledge of precautions, Decreased range of motion, Decreased strength, Increased muscle spasms, Impaired perceived functional ability, Impaired UE functional use, Postural dysfunction, Pain  Visit Diagnosis: Acute pain of left shoulder  Other symptoms and signs involving the musculoskeletal system  Muscle weakness (generalized)  Abnormal posture       G-Codes - 0Aug 22, 20181441    Functional Assessment Tool Used (Outpatient  Only) FOTO, clinical judgement, AROM, PROM, QuickDASH   Functional Limitation Mobility: Walking and moving around   Mobility: Walking and Moving Around Current Status [phone] At least 40 percent but less than 60 percent impaired, limited or restricted   Mobility: Walking and Moving Around Goal Status ([phone] At least 20 percent but less than 40 percent impaired, limited or restricted      Problem List Patient Active Problem List   Diagnosis Date Noted  . Melena 12/05/2016  . Family hx of colon cancer 06/08/2016  . OA (osteoarthritis) of knee 12/27/2015  . Screening for osteoporosis 02/25/2014  . 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 Allergic reaction 05/13/2013  . Hyperlipemia 04/24/2013  . Hypertension 04/24/2013     BGeraldine SolarPT, DPT  [place]SL[place] [address]Phone: [phone]  Fax:  [phone] Name: Dawn LESUEURMRN: [id number] of Birth: 6November 21, 1942

## 2017-06-07 ENCOUNTER — Encounter (HOSPITAL_COMMUNITY): Payer: Medicare Other

## 2017-06-08 ENCOUNTER — Ambulatory Visit (HOSPITAL_COMMUNITY): Payer: Medicare Other

## 2017-06-08 DIAGNOSIS — M25512 Pain in left shoulder: Secondary | ICD-10-CM

## 2017-06-08 DIAGNOSIS — R293 Abnormal posture: Secondary | ICD-10-CM

## 2017-06-08 DIAGNOSIS — M6281 Muscle weakness (generalized): Secondary | ICD-10-CM | POA: Diagnosis not present

## 2017-06-08 DIAGNOSIS — R29898 Other symptoms and signs involving the musculoskeletal system: Secondary | ICD-10-CM | POA: Diagnosis not present

## 2017-06-08 NOTE — Therapy (Signed)
[place], [place], [address] Phone: [phone]   Fax:  [phone]  Physical Therapy Treatment  Patient Details  Name: [name] MRN: [id number] Date of Birth: 1941-02-24 Referring Provider: Dr. Theda Sers  Encounter Date: 06/08/2017      PT End of Session - 06/08/17 1629    Visit Number 9   Number of Visits 17   Date for PT Re-Evaluation 06/29/17   Authorization Type UHC Medicare   Authorization Time Period 05/04/17 to 06/29/17   Authorization - Visit Number 9   Authorization - Number of Visits 10   PT Start Time 5102   PT Stop Time 5852   PT Time Calculation (min) 38 min   Activity Tolerance Patient tolerated treatment well;No increased pain   Behavior During Therapy WFL for tasks assessed/performed      Past Medical History:  Diagnosis Date  . Allergy   . Angioedema   . Arthritis   . GERD (gastroesophageal reflux disease)   . Headache   . Hx of echocardiogram 10/2005   normal  . Hyperlipidemia   . Hypertension   . Thyroid disease    treated with radiation in 1980s     Past Surgical History:  Procedure Laterality Date  . ABDOMINAL HYSTERECTOMY    . CHOLECYSTECTOMY    . COLONOSCOPY  06/15/2011   Procedure: COLONOSCOPY;  Surgeon: Rogene Houston, MD;  [facility];  Service: Endoscopy;  Laterality: N/A;  . COLONOSCOPY N/A 07/27/2016   Procedure: COLONOSCOPY;  Surgeon: Rogene Houston, MD;  [facility];  Service: Endoscopy;  Laterality: N/A;  230  . ESOPHAGOGASTRODUODENOSCOPY N/A 12/08/2016   Procedure: ESOPHAGOGASTRODUODENOSCOPY (EGD);  Surgeon: Rogene Houston, MD;  [facility];  Service: Endoscopy;  Laterality: N/A;  120  . EYE SURGERY     removed cyst from right eye in june 2015. sept 9th surgery for eye lid drop on both eyes  . JOINT REPLACEMENT    . REPLACEMENT TOTAL KNEE Right 2000  . TOTAL KNEE ARTHROPLASTY Left 12/27/2015   Procedure: LEFT TOTAL KNEE  ARTHROPLASTY;  Surgeon: Gaynelle Arabian, MD;  [facility];  Service: Orthopedics;  Laterality: Left;  . TUBAL LIGATION      There were no vitals filed for this visit.      Subjective Assessment - 06/08/17 1617    Subjective Pt stated her arm is feeling good today, no reports of pain currently.  Reports compliance with HEP daily.     Diagnostic tests per pt, she reports she had x-ray with Dr. Theda Sers which was negative for fractures or RTV damage   Patient Stated Goals get 100% back   Currently in Pain? No/denies                         [facility] Adult PT Treatment/Exercise - 06/08/17 0001      Shoulder Exercises: Supine   Other Supine Exercises supine:wand flexion, AROM IR/ER     Shoulder Exercises: Sidelying   External Rotation AROM;Left;15 reps   Flexion Left;15 reps   ABduction AAROM;Left;15 reps   ABduction Limitations cueing for form     Shoulder Exercises: Standing   Other Standing Exercises wall walking for flexion     Shoulder Exercises: Therapy Ball   Flexion 10 reps   ABduction 10 reps   Right/Left 10 reps   Right/Left Limitations IR/ER     Manual Therapy  Manual Therapy Soft tissue mobilization   Manual therapy comments Performed separatelty from all other interventions at end of session   Soft tissue mobilization L distal UT and SST muscle belly and insertion   Passive ROM PROM supine flexion and abduction 10x with 10" holds at end range                  PT Short Term Goals - 06/05/17 1349      PT SHORT TERM GOAL #1   Title Pt will be independent with HEP and perform consistently to maximize her return to PLOF and decrease risk of reinjury.   Baseline 8/14: performing 2x/day   Time 4   Period Weeks   Status Achieved     PT SHORT TERM GOAL #2   Title Pt will be able to perform L shoulder flexion and abd AROM to at least 90 deg to decrease pain and maximzie overall function at home.   Time 4   Period Weeks   Status On-going      PT SHORT TERM GOAL #3   Title Pt will report being able to sleep through the night without awakening due to pain in order to maximize recovery.   Baseline 8/14: awakes her 2x/night   Time 4   Period Weeks   Status On-going           PT Long Term Goals - 06/05/17 1350      PT LONG TERM GOAL #1   Title Pt will have L shoulder flexion and abd AROM to at least 150 deg each without pain in order to maximize pt's ability to dress and bathe.   Time 8   Period Weeks   Status On-going     PT LONG TERM GOAL #2   Title Pt will have at least 4+/5 strength in LUE in order to maximize her ability to perform ADLs and IADLs at home with no pain.   Time 8   Period Weeks   Status Partially Met     PT LONG TERM GOAL #3   Title Pt will have an improvement in her QuickDASH by 8% or > to demonstrate improved overall perceived function.   Time 8   Period Weeks   Status Achieved               Plan - 06/08/17 1657    Clinical Impression Statement Session focus on shoulder mobility and education for appropraite musculature activation wtih movement.  Added abduction and IR/ER with ball and wall walking to progress ROM and strengthening.  Cueing to improve mechanics with AAROM therex.  EOS with manual to address soft tissue mobilization restrictions mainly upper trap and over all shoulder region.  No reports of pain at EOS.     Rehab Potential Fair   PT Frequency 2x / week   PT Duration 8 weeks   PT Treatment/Interventions ADLs/Self Care Home Management;Cryotherapy;Electrical Stimulation;Iontophoresis 27m/ml Dexamethasone;Moist Heat;Ultrasound;Therapeutic activities;Therapeutic exercise;Balance training;Neuromuscular re-education;Patient/family education;Manual techniques;Passive range of motion;Dry needling;Taping   PT Next Visit Plan Continue manual to shoulder and biceps musculature for pain management; continue mobs and traction for pain; PROM in all directions, AAROM. Progress according  to pain tolerance; sidelying flexion and abd with emphasis on decreasing shoulder hike.  continue ball rollout and wall walking next session   PT Home Exercise Plan 7/18:  AROM wand flexion, IR, pendulums, shoulder rolls 7/23 3D thoracic excursions; 7/24: biceps stretch in doorway, pulleys; 8/14: ER doorway stretch  Patient will benefit from skilled therapeutic intervention in order to improve the following deficits and impairments:  Decreased activity tolerance, Decreased knowledge of precautions, Decreased range of motion, Decreased strength, Increased muscle spasms, Impaired perceived functional ability, Impaired UE functional use, Postural dysfunction, Pain  Visit Diagnosis: Acute pain of left shoulder  Other symptoms and signs involving the musculoskeletal system  Muscle weakness (generalized)  Abnormal posture     Problem List Patient Active Problem List   Diagnosis Date Noted  . Melena 12/05/2016  . Family hx of colon cancer 06/08/2016  . OA (osteoarthritis) of knee 12/27/2015  . Screening for osteoporosis 02/25/2014  . 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 Allergic reaction 05/13/2013  . Hyperlipemia 04/24/2013  . Hypertension 04/24/2013   Ihor Austin, [place]; [place]  Aldona Lento 06/08/2017, [place], [place], [address] Phone: [phone]   Fax:  [phone]  Name: MARRAH VANEVERY MRN: [id number] Date of Birth: [birth year]

## 2017-06-11 ENCOUNTER — Ambulatory Visit (HOSPITAL_COMMUNITY): Payer: Medicare Other | Admitting: Physical Therapy

## 2017-06-11 DIAGNOSIS — R29898 Other symptoms and signs involving the musculoskeletal system: Secondary | ICD-10-CM | POA: Diagnosis not present

## 2017-06-11 DIAGNOSIS — M25512 Pain in left shoulder: Secondary | ICD-10-CM | POA: Diagnosis not present

## 2017-06-11 DIAGNOSIS — M6281 Muscle weakness (generalized): Secondary | ICD-10-CM | POA: Diagnosis not present

## 2017-06-11 DIAGNOSIS — R293 Abnormal posture: Secondary | ICD-10-CM | POA: Diagnosis not present

## 2017-06-11 NOTE — Patient Instructions (Signed)
   Triceps release (with lax ball)  Place a lacrosse ball in the back of the arm and roll over any sore spots with a lacrosse ball.  Keep the arm bent to about 90 deg to encourage the stretch. Use the opposite arm to stabilize the forearm of the affected side.

## 2017-06-11 NOTE — Therapy (Signed)
[place], [place], [address] Phone: [phone]   Fax:  [phone]  Physical Therapy Treatment  Patient Details  Name: [name] MRN: [id number] Date of Birth: 17-Jan-1941 Referring Provider: Dr. Theda Sers  Encounter Date: 06/11/2017      PT End of Session - 06/11/17 1428    Visit Number 10   Number of Visits 17   Date for PT Re-Evaluation 06/29/17   Authorization Type [employer] Medicare   Authorization Time Period 05/04/17 to 06/29/17   Authorization - Visit Number 10   Authorization - Number of Visits 18   PT Start Time 1346   PT Stop Time 1424   PT Time Calculation (min) 38 min   Activity Tolerance Patient tolerated treatment well   Behavior During Therapy [facility] for tasks assessed/performed      Past Medical History:  Diagnosis Date  . Allergy   . Angioedema   . Arthritis   . GERD (gastroesophageal reflux disease)   . Headache   . Hx of echocardiogram 10/2005   normal  . Hyperlipidemia   . Hypertension   . Thyroid disease    treated with radiation in 1980s     Past Surgical History:  Procedure Laterality Date  . ABDOMINAL HYSTERECTOMY    . CHOLECYSTECTOMY    . COLONOSCOPY  06/15/2011   Procedure: COLONOSCOPY;  Surgeon: Rogene Houston, MD;  [facility];  Service: Endoscopy;  Laterality: N/A;  . COLONOSCOPY N/A 07/27/2016   Procedure: COLONOSCOPY;  Surgeon: Rogene Houston, MD;  [facility];  Service: Endoscopy;  Laterality: N/A;  230  . ESOPHAGOGASTRODUODENOSCOPY N/A 12/08/2016   Procedure: ESOPHAGOGASTRODUODENOSCOPY (EGD);  Surgeon: Rogene Houston, MD;  [facility];  Service: Endoscopy;  Laterality: N/A;  120  . EYE SURGERY     removed cyst from right eye in june 2015. sept 9th surgery for eye lid drop on both eyes  . JOINT REPLACEMENT    . REPLACEMENT TOTAL KNEE Right 2000  . TOTAL KNEE ARTHROPLASTY Left 12/27/2015   Procedure: LEFT TOTAL KNEE ARTHROPLASTY;   Surgeon: Gaynelle Arabian, MD;  [facility];  Service: Orthopedics;  Laterality: Left;  . TUBAL LIGATION      There were no vitals filed for this visit.      Subjective Assessment - 06/11/17 1348    Subjective Patient states her shoulder is hurting a little more today, the weather today is still making it hurt and she still has trouble bringing her arm up.    Pertinent History HTN, hyperlipidemia, arthritis, GERD, thyroid disease   Patient Stated Goals get 100% back   Currently in Pain? Yes   Pain Score 4    Pain Location Shoulder   Pain Orientation Left   Pain Descriptors / Indicators Aching;Nagging   Pain Type Acute pain   Pain Radiating Towards none    Pain Onset 1 to 4 weeks ago   Pain Frequency Intermittent   Aggravating Factors  bad weather, moving it certain ways    Pain Relieving Factors rest and ice    Effect of Pain on Daily Activities moderate limitation                          [facility] Adult PT Treatment/Exercise - 06/11/17 0001      Shoulder Exercises: Supine   Other Supine Exercises supine wand flexon 1x20; L triceps stretch 3x30 seconds supine  Other Supine Exercises L shoulder distraction and oscillation for pain relief      Shoulder Exercises: Seated   Other Seated Exercises seated towel stretches for shoulder flexion and ABD 10x10 seconds each      Shoulder Exercises: Standing   Other Standing Exercises wall walking for flexion x15 cues to reduce compensations    Other Standing Exercises triceps self massage with ball (HEP review)     Manual Therapy   Manual Therapy Soft tissue mobilization   Manual therapy comments Performed separatelty from all other interventions at end of session   Soft tissue mobilization L triceps in sidelying        UBE 3 minutes forward, then 3 minutes backward not included in billling at EOS          PT Education - 06/11/17 1428    Education provided Yes   Education Details self triceps release with  ball on wall    Person(s) Educated Patient   Methods Explanation;Demonstration;Handout   Comprehension Verbalized understanding;Returned demonstration;Need further instruction          PT Short Term Goals - 06/05/17 1349      PT SHORT TERM GOAL #1   Title Pt will be independent with HEP and perform consistently to maximize her return to PLOF and decrease risk of reinjury.   Baseline 8/14: performing 2x/day   Time 4   Period Weeks   Status Achieved     PT SHORT TERM GOAL #2   Title Pt will be able to perform L shoulder flexion and abd AROM to at least 90 deg to decrease pain and maximzie overall function at home.   Time 4   Period Weeks   Status On-going     PT SHORT TERM GOAL #3   Title Pt will report being able to sleep through the night without awakening due to pain in order to maximize recovery.   Baseline 8/14: awakes her 2x/night   Time 4   Period Weeks   Status On-going           PT Long Term Goals - 06/05/17 1350      PT LONG TERM GOAL #1   Title Pt will have L shoulder flexion and abd AROM to at least 150 deg each without pain in order to maximize pt's ability to dress and bathe.   Time 8   Period Weeks   Status On-going     PT LONG TERM GOAL #2   Title Pt will have at least 4+/5 strength in LUE in order to maximize her ability to perform ADLs and IADLs at home with no pain.   Time 8   Period Weeks   Status Partially Met     PT LONG TERM GOAL #3   Title Pt will have an improvement in her QuickDASH by 8% or > to demonstrate improved overall perceived function.   Time 8   Period Weeks   Status Achieved               Plan - 06/11/17 1429    Clinical Impression Statement Patient arrives continuing to complain of difficulty in raising her L UE up overhead. Began session with STM to L triceps due to patient complaints of restriction in this area, also noted active muscle fasciculation in this muscle group which did not improve with manual. Continued  with AAROM as able and PROM when appropriate, otherwise continued ROM/strength/flexibility exercises/activities as tolerated today. Suspect that triceps is playing a role  in functional impairments at this time and recommend further follow-up/intervention regarding this.    Rehab Potential Fair   PT Frequency 2x / week   PT Duration 8 weeks   PT Treatment/Interventions ADLs/Self Care Home Management;Cryotherapy;Electrical Stimulation;Iontophoresis 37m/ml Dexamethasone;Moist Heat;Ultrasound;Therapeutic activities;Therapeutic exercise;Balance training;Neuromuscular re-education;Patient/family education;Manual techniques;Passive range of motion;Dry needling;Taping   PT Next Visit Plan F/U on triceps limitations. Continue manual to shoulder and biceps musculature for pain management; continue mobs and traction for pain; PROM in all directions, AAROM. Progress according to pain tolerance; sidelying flexion and abd with emphasis on decreasing shoulder hike.  continue ball rollout and wall walking next session   PT Home Exercise Plan 7/18:  AROM wand flexion, IR, pendulums, shoulder rolls 7/23 3D thoracic excursions; 7/24: biceps stretch in doorway, pulleys; 8/14: ER doorway stretch 8/20: self triceps STM with ball    Consulted and Agree with Plan of Care Patient      Patient will benefit from skilled therapeutic intervention in order to improve the following deficits and impairments:  Decreased activity tolerance, Decreased knowledge of precautions, Decreased range of motion, Decreased strength, Increased muscle spasms, Impaired perceived functional ability, Impaired UE functional use, Postural dysfunction, Pain  Visit Diagnosis: Acute pain of left shoulder  Other symptoms and signs involving the musculoskeletal system  Muscle weakness (generalized)  Abnormal posture     Problem List Patient Active Problem List   Diagnosis Date Noted  . Melena 12/05/2016  . Family hx of colon cancer 06/08/2016   . OA (osteoarthritis) of knee 12/27/2015  . Screening for osteoporosis 02/25/2014  . 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 Allergic reaction 05/13/2013  . Hyperlipemia 04/24/2013  . Hypertension 04/24/2013    KDeniece ReePT, DPT [address] StreetSEast [place] [address]Phone: [phone]  Fax:  [phone] Name: Dawn CLECKLEYMRN: [id number] of Birth: [birth year]

## 2017-06-13 ENCOUNTER — Ambulatory Visit (HOSPITAL_COMMUNITY): Payer: Medicare Other

## 2017-06-13 DIAGNOSIS — R293 Abnormal posture: Secondary | ICD-10-CM | POA: Diagnosis not present

## 2017-06-13 DIAGNOSIS — R29898 Other symptoms and signs involving the musculoskeletal system: Secondary | ICD-10-CM | POA: Diagnosis not present

## 2017-06-13 DIAGNOSIS — M6281 Muscle weakness (generalized): Secondary | ICD-10-CM

## 2017-06-13 DIAGNOSIS — M25512 Pain in left shoulder: Secondary | ICD-10-CM

## 2017-06-13 NOTE — Therapy (Signed)
[place], [place], [address] Phone: [phone]   Fax:  [phone]  Physical Therapy Treatment  Patient Details  Name: [name] MRN: [id number] Date of Birth: Oct 28, 1940 Referring Provider: Dr. Theda Sers  Encounter Date: 06/13/2017      PT End of Session - 06/13/17 0903    Visit Number 11   Number of Visits 17   Date for PT Re-Evaluation 06/29/17   Authorization Type U[employer] Medicare   Authorization Time Period 05/04/17 to 06/29/17   Authorization - Visit Number 11   Authorization - Number of Visits 18   PT Start Time 0819   PT Stop Time 0906   PT Time Calculation (min) 47 min   Activity Tolerance Patient tolerated treatment well;No increased pain   Behavior During Therapy WFL for tasks assessed/performed      Past Medical History:  Diagnosis Date  . Allergy   . Angioedema   . Arthritis   . GERD (gastroesophageal reflux disease)   . Headache   . Hx of echocardiogram 10/2005   normal  . Hyperlipidemia   . Hypertension   . Thyroid disease    treated with radiation in 1980s     Past Surgical History:  Procedure Laterality Date  . ABDOMINAL HYSTERECTOMY    . CHOLECYSTECTOMY    . COLONOSCOPY  06/15/2011   Procedure: COLONOSCOPY;  Surgeon: Rogene Houston, MD;  [facility];  Service: Endoscopy;  Laterality: N/A;  . COLONOSCOPY N/A 07/27/2016   Procedure: COLONOSCOPY;  Surgeon: Rogene Houston, MD;  [facility];  Service: Endoscopy;  Laterality: N/A;  230  . ESOPHAGOGASTRODUODENOSCOPY N/A 12/08/2016   Procedure: ESOPHAGOGASTRODUODENOSCOPY (EGD);  Surgeon: Rogene Houston, MD;  [facility];  Service: Endoscopy;  Laterality: N/A;  120  . EYE SURGERY     removed cyst from right eye in june 2015. sept 9th surgery for eye lid drop on both eyes  . JOINT REPLACEMENT    . REPLACEMENT TOTAL KNEE Right 2000  . TOTAL KNEE ARTHROPLASTY Left 12/27/2015   Procedure: LEFT TOTAL  KNEE ARTHROPLASTY;  Surgeon: Gaynelle Arabian, MD;  [facility];  Service: Orthopedics;  Laterality: Left;  . TUBAL LIGATION      There were no vitals filed for this visit.      Subjective Assessment - 06/13/17 0831    Subjective Pt stated her shoulder is feeling good today, no reoprts of pain currently.  Reports compliance with HEP daily, has busy day later today.   Pertinent History HTN, hyperlipidemia, arthritis, GERD, thyroid disease   Patient Stated Goals get 100% back   Currently in Pain? No/denies                         [facility] Adult PT Treatment/Exercise - 06/13/17 0001      Shoulder Exercises: Supine   Flexion AROM;10 reps   ABduction AAROM;Left;15 reps   Other Supine Exercises supine wand flexon 1x20; L triceps stretch 3x30 seconds supine    Other Supine Exercises PNF D2 AAROM 15x     Shoulder Exercises: Sidelying   ABduction AROM;15 reps   ABduction Limitations cueing for form     Shoulder Exercises: Standing   Other Standing Exercises wall walking for flexion x15 cues to reduce compensations      Shoulder Exercises: Therapy Ball   Flexion 10 reps   ABduction 10 reps   Right/Left 15 reps  Right/Left Limitations IR/ER     Shoulder Exercises: ROM/Strengthening   UBE (Upper Arm Bike) 3' forward, 3' backward L1 (not included in charges)     Manual Therapy   Manual Therapy Soft tissue mobilization   Manual therapy comments Performed separatelty from all other interventions at end of session   Joint Mobilization L shoulder distraction and oscillation for pain relief    Soft tissue mobilization L triceps in sidelying                   PT Short Term Goals - 06/05/17 1349      PT SHORT TERM GOAL #1   Title Pt will be independent with HEP and perform consistently to maximize her return to PLOF and decrease risk of reinjury.   Baseline 8/14: performing 2x/day   Time 4   Period Weeks   Status Achieved     PT SHORT TERM GOAL #2    Title Pt will be able to perform L shoulder flexion and abd AROM to at least 90 deg to decrease pain and maximzie overall function at home.   Time 4   Period Weeks   Status On-going     PT SHORT TERM GOAL #3   Title Pt will report being able to sleep through the night without awakening due to pain in order to maximize recovery.   Baseline 8/14: awakes her 2x/night   Time 4   Period Weeks   Status On-going           PT Long Term Goals - 06/05/17 1350      PT LONG TERM GOAL #1   Title Pt will have L shoulder flexion and abd AROM to at least 150 deg each without pain in order to maximize pt's ability to dress and bathe.   Time 8   Period Weeks   Status On-going     PT LONG TERM GOAL #2   Title Pt will have at least 4+/5 strength in LUE in order to maximize her ability to perform ADLs and IADLs at home with no pain.   Time 8   Period Weeks   Status Partially Met     PT LONG TERM GOAL #3   Title Pt will have an improvement in her QuickDASH by 8% or > to demonstrate improved overall perceived function.   Time 8   Period Weeks   Status Achieved               Plan - 06/13/17 7672    Clinical Impression Statement Pt continues to c/o difficulty with Lt UE flexion overhead.  Session focus on shoulder mobility with additional therex with AROM for strengthening.  Manual soft tissue mobilization complete to Lt triceps to assist with flexion mobility and distraction joint mobs complete for increased ease/pain control.   Pt reports compliance with self massage with ball against wall with HEP.  No reports of pain through session.   Rehab Potential Fair   PT Frequency 2x / week   PT Duration 8 weeks   PT Treatment/Interventions ADLs/Self Care Home Management;Cryotherapy;Electrical Stimulation;Iontophoresis 107m/ml Dexamethasone;Moist Heat;Ultrasound;Therapeutic activities;Therapeutic exercise;Balance training;Neuromuscular re-education;Patient/family education;Manual  techniques;Passive range of motion;Dry needling;Taping   PT Next Visit Plan F/U on triceps limitations. Continue manual to shoulder and biceps musculature for pain management; continue mobs and traction for pain; PROM in all directions, AAROM. Progress according to pain tolerance; sidelying flexion and abd with emphasis on decreasing shoulder hike.  continue ball rollout and wall walking next  session   PT Home Exercise Plan 7/18:  AROM wand flexion, IR, pendulums, shoulder rolls 7/23 3D thoracic excursions; 7/24: biceps stretch in doorway, pulleys; 8/14: ER doorway stretch 8/20: self triceps STM with ball       Patient will benefit from skilled therapeutic intervention in order to improve the following deficits and impairments:  Decreased activity tolerance, Decreased knowledge of precautions, Decreased range of motion, Decreased strength, Increased muscle spasms, Impaired perceived functional ability, Impaired UE functional use, Postural dysfunction, Pain  Visit Diagnosis: Acute pain of left shoulder  Other symptoms and signs involving the musculoskeletal system  Muscle weakness (generalized)  Abnormal posture     Problem List Patient Active Problem List   Diagnosis Date Noted  . Melena 12/05/2016  . Family hx of colon cancer 06/08/2016  . OA (osteoarthritis) of knee 12/27/2015  . Screening for osteoporosis 02/25/2014  . 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 Allergic reaction 05/13/2013  . Hyperlipemia 04/24/2013  . Hypertension 04/24/2013   Ihor Austin, [place]; [place]  Aldona Lento 06/13/2017, 9:04 AM  [place] [address] [place], [place], [address] Phone: [phone]   Fax:  [phone]  Name: [name] MRN: [id number] Date of Birth: 09/14/1941

## 2017-06-18 ENCOUNTER — Ambulatory Visit (HOSPITAL_COMMUNITY): Payer: Medicare Other | Admitting: Physical Therapy

## 2017-06-18 DIAGNOSIS — R293 Abnormal posture: Secondary | ICD-10-CM

## 2017-06-18 DIAGNOSIS — M25512 Pain in left shoulder: Secondary | ICD-10-CM | POA: Diagnosis not present

## 2017-06-18 DIAGNOSIS — M6281 Muscle weakness (generalized): Secondary | ICD-10-CM

## 2017-06-18 DIAGNOSIS — R29898 Other symptoms and signs involving the musculoskeletal system: Secondary | ICD-10-CM

## 2017-06-18 NOTE — Therapy (Signed)
[facility] [address] [place], [place], [address] Phone: [phone]   Fax:  [phone]  Physical Therapy Treatment  Patient Details  Name: [name] MRN: [id number] Date of Birth: 09/25/1941 Referring Provider: Dr. Collins  Encounter Date: 06/18/2017      PT End of Session - 06/18/17 1657    Visit Number 12   Number of Visits 17   Date for PT Re-Evaluation 06/29/17   Authorization Type UHC Medicare   Authorization Time Period 05/04/17 to 06/29/17   Authorization - Visit Number 12   Authorization - Number of Visits 18   PT Start Time 1600   PT Stop Time 1639   PT Time Calculation (min) 39 min   Activity Tolerance Patient tolerated treatment well;Patient limited by pain   Behavior During Therapy WFL for tasks assessed/performed      Past Medical History:  Diagnosis Date  . Allergy   . Angioedema   . Arthritis   . GERD (gastroesophageal reflux disease)   . Headache   . Hx of echocardiogram 10/2005   normal  . Hyperlipidemia   . Hypertension   . Thyroid disease    treated with radiation in 1980s     Past Surgical History:  Procedure Laterality Date  . ABDOMINAL HYSTERECTOMY    . CHOLECYSTECTOMY    . COLONOSCOPY  06/15/2011   Procedure: COLONOSCOPY;  Surgeon: Najeeb U Rehman, MD;  [facility];  Service: Endoscopy;  Laterality: N/A;  . COLONOSCOPY N/A 07/27/2016   Procedure: COLONOSCOPY;  Surgeon: Najeeb U Rehman, MD;  [facility];  Service: Endoscopy;  Laterality: N/A;  230  . ESOPHAGOGASTRODUODENOSCOPY N/A 12/08/2016   Procedure: ESOPHAGOGASTRODUODENOSCOPY (EGD);  Surgeon: Najeeb U Rehman, MD;  [facility];  Service: Endoscopy;  Laterality: N/A;  120  . EYE SURGERY     removed cyst from right eye in june 2015. sept 9th surgery for eye lid drop on both eyes  . JOINT REPLACEMENT    . REPLACEMENT TOTAL KNEE Right 2000  . TOTAL KNEE ARTHROPLASTY Left 12/27/2015   Procedure: LEFT  TOTAL KNEE ARTHROPLASTY;  Surgeon: Frank Aluisio, MD;  [facility];  Service: Orthopedics;  Laterality: Left;  . TUBAL LIGATION      There were no vitals filed for this visit.      Subjective Assessment - 06/18/17 1602    Subjective Patient states she is alright today, she is feeling OK and the work on her triceps seems to be helping but it is still difficult for her to bring her arm up    Pertinent History HTN, hyperlipidemia, arthritis, GERD, thyroid disease   Diagnostic tests per pt, she reports she had x-ray with Dr. Collins which was negative for fractures or RTV damage   Patient Stated Goals get 100% back   Currently in Pain? No/denies            [facility] PT Assessment - 06/18/17 0001      Observation/Other Assessments   Observations ongoing fasciculation L triceps; general pectoralis MMT approx 3/5 limited by pain; pain distal pectoral region L; oppostion exercise appears WNL;; RAM normal and limited by L shoulder pain/stiffness rather than neuro presentation      Sensation   Light Touch Impaired by gross assessment;Impaired Detail   Light Touch Impaired Details --  T1 dermatome                        [facility] Adult PT Treatment/Exercise - 06/18/17 0001      Shoulder Exercises: Standing   Other Standing Exercises wall walking x15 with cues for reduction of compensations    Other Standing Exercises attempted standing and supine shoulder stabilization however limited by pain      Manual Therapy   Manual Therapy Soft tissue mobilization;Joint mobilization   Manual therapy comments Performed separatelty from all other interventions at end of session   Joint Mobilization L shoulder distraction/oscillation/PROM    Soft tissue mobilization L triceps and L distal pectoral region supine                 PT Education - 06/18/17 1654    Education provided Yes   Education Details ongoing muscle fasciculation in tricepse, findings of special tests today;  recommend seeing MD at least regarding ongoing fasciculation in triceps    Person(s) Educated Patient   Methods Explanation   Comprehension Verbalized understanding          PT Short Term Goals - 06/05/17 1349      PT SHORT TERM GOAL #1   Title Pt will be independent with HEP and perform consistently to maximize her return to PLOF and decrease risk of reinjury.   Baseline 8/14: performing 2x/day   Time 4   Period Weeks   Status Achieved     PT SHORT TERM GOAL #2   Title Pt will be able to perform L shoulder flexion and abd AROM to at least 90 deg to decrease pain and maximzie overall function at home.   Time 4   Period Weeks   Status On-going     PT SHORT TERM GOAL #3   Title Pt will report being able to sleep through the night without awakening due to pain in order to maximize recovery.   Baseline 8/14: awakes her 2x/night   Time 4   Period Weeks   Status On-going           PT Long Term Goals - 06/05/17 1350      PT LONG TERM GOAL #1   Title Pt will have L shoulder flexion and abd AROM to at least 150 deg each without pain in order to maximize pt's ability to dress and bathe.   Time 8   Period Weeks   Status On-going     PT LONG TERM GOAL #2   Title Pt will have at least 4+/5 strength in LUE in order to maximize her ability to perform ADLs and IADLs at home with no pain.   Time 8   Period Weeks   Status Partially Met     PT LONG TERM GOAL #3   Title Pt will have an improvement in her QuickDASH by 8% or > to demonstrate improved overall perceived function.   Time 8   Period Weeks   Status Achieved               Plan - 06/18/17 1657    Clinical Impression Statement Patient arrives today continuing to report ongoing difficulty with shoulder elevation. Continued to note fasciculation in L triceps as well as muscle knotting; addressed with STM for muscle knotting however continued to note fasciculation following manual with no apparent change in  intensity/strength. Also noted significant tenderness on distal pectoralis attachments L as well as significant tissue knotting in this area and difficulty with pectoralis MMT tests. Attempted shoulder stabilization exercises with ongoing pain in anterior and posterior shoulder. Educated patient on possible benefits  of seeing MD regarding ongoing fasciculation pattern in L triceps especially given deficit in light touch testing T1 dermatome left.    Rehab Potential Fair   PT Frequency 2x / week   PT Duration 8 weeks   PT Treatment/Interventions ADLs/Self Care Home Management;Cryotherapy;Electrical Stimulation;Iontophoresis 4mg/ml Dexamethasone;Moist Heat;Ultrasound;Therapeutic activities;Therapeutic exercise;Balance training;Neuromuscular re-education;Patient/family education;Manual techniques;Passive range of motion;Dry needling;Taping   PT Next Visit Plan conintue working on STM to L UE including triceps, distal pectorals, biceps; continue mobs and traction, ROM exercises with progression as appropriate. Cotninue ball rolls annd wall walks.    PT Home Exercise Plan 7/18:  AROM wand flexion, IR, pendulums, shoulder rolls 7/23 3D thoracic excursions; 7/24: biceps stretch in doorway, pulleys; 8/14: ER doorway stretch 8/20: self triceps STM with ball    Consulted and Agree with Plan of Care Patient      Patient will benefit from skilled therapeutic intervention in order to improve the following deficits and impairments:  Decreased activity tolerance, Decreased knowledge of precautions, Decreased range of motion, Decreased strength, Increased muscle spasms, Impaired perceived functional ability, Impaired UE functional use, Postural dysfunction, Pain  Visit Diagnosis: Acute pain of left shoulder  Other symptoms and signs involving the musculoskeletal system  Muscle weakness (generalized)  Abnormal posture     Problem List Patient Active Problem List   Diagnosis Date Noted  . Dawn 12/05/2016   . Family hx of colon cancer 06/08/2016  . OA (osteoarthritis) of knee 12/27/2015  . Screening for osteoporosis 02/25/2014  . 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 Allergic reaction 05/13/2013  . Hyperlipemia 04/24/2013  . Hypertension 04/24/2013     Kristen Unger PT, DPT [phone]  [facility] [address] [place], [place], [address] Phone: [phone]   Fax:  [phone]  Name: [name] MRN: [id number] Date of Birth: 12/04/1940   

## 2017-06-19 ENCOUNTER — Telehealth (HOSPITAL_COMMUNITY): Payer: Self-pay | Admitting: Family Medicine

## 2017-06-19 NOTE — Telephone Encounter (Signed)
06/19/17  Pt cx but no reason was given

## 2017-06-20 ENCOUNTER — Ambulatory Visit (HOSPITAL_COMMUNITY): Payer: Medicare Other | Admitting: Physical Therapy

## 2017-06-20 DIAGNOSIS — S4982XD Other specified injuries of left shoulder and upper arm, subsequent encounter: Secondary | ICD-10-CM | POA: Diagnosis not present

## 2017-06-26 ENCOUNTER — Ambulatory Visit (HOSPITAL_COMMUNITY): Payer: Medicare Other | Attending: Physician Assistant

## 2017-06-26 DIAGNOSIS — M25512 Pain in left shoulder: Secondary | ICD-10-CM | POA: Diagnosis not present

## 2017-06-26 DIAGNOSIS — R29898 Other symptoms and signs involving the musculoskeletal system: Secondary | ICD-10-CM

## 2017-06-26 DIAGNOSIS — M6281 Muscle weakness (generalized): Secondary | ICD-10-CM

## 2017-06-26 DIAGNOSIS — R293 Abnormal posture: Secondary | ICD-10-CM | POA: Diagnosis not present

## 2017-06-26 NOTE — Therapy (Signed)
[place], [place], [address] Phone: [phone]   Fax:  [phone]  Physical Therapy Treatment  Patient Details  Name: [name] MRN: [id number] Date of Birth: 23-May-1941 Referring Provider: Dr. Theda Sers  Encounter Date: 06/26/2017      PT End of Session - 06/26/17 1510    Visit Number 13   Number of Visits 17   Date for PT Re-Evaluation 06/29/17   Authorization Type [employer] Medicare   Authorization Time Period 05/04/17 to 06/29/17   Authorization - Visit Number 13   Authorization - Number of Visits 18   PT Start Time 9702   PT Stop Time 6378   PT Time Calculation (min) 38 min   Activity Tolerance Patient tolerated treatment well;No increased pain   Behavior During Therapy WFL for tasks assessed/performed      Past Medical History:  Diagnosis Date  . Allergy   . Angioedema   . Arthritis   . GERD (gastroesophageal reflux disease)   . Headache   . Hx of echocardiogram 10/2005   normal  . Hyperlipidemia   . Hypertension   . Thyroid disease    treated with radiation in 1980s     Past Surgical History:  Procedure Laterality Date  . ABDOMINAL HYSTERECTOMY    . CHOLECYSTECTOMY    . COLONOSCOPY  06/15/2011   Procedure: COLONOSCOPY;  Surgeon: Rogene Houston, MD;  [facility];  Service: Endoscopy;  Laterality: N/A;  . COLONOSCOPY N/A 07/27/2016   Procedure: COLONOSCOPY;  Surgeon: Rogene Houston, MD;  [facility];  Service: Endoscopy;  Laterality: N/A;  230  . ESOPHAGOGASTRODUODENOSCOPY N/A 12/08/2016   Procedure: ESOPHAGOGASTRODUODENOSCOPY (EGD);  Surgeon: Rogene Houston, MD;  [facility];  Service: Endoscopy;  Laterality: N/A;  120  . EYE SURGERY     removed cyst from right eye in june 2015. sept 9th surgery for eye lid drop on both eyes  . JOINT REPLACEMENT    . REPLACEMENT TOTAL KNEE Right 2000  . TOTAL KNEE ARTHROPLASTY Left 12/27/2015   Procedure: LEFT TOTAL  KNEE ARTHROPLASTY;  Surgeon: Gaynelle Arabian, MD;  [facility];  Service: Orthopedics;  Laterality: Left;  . TUBAL LIGATION      There were no vitals filed for this visit.      Subjective Assessment - 06/26/17 1438    Subjective Pt reports she is feeling good today, no reoprts of pain.  Went to MD following last apt and reports no damage in rotator cuff but noted arthritis in shoulder.  Given shot in shoulder wtih reports of relief following and encouraged to continue wiht PT sessions for 2 more apt then continue wiht HEP at home.     Patient Stated Goals get 100% back   Currently in Pain? No/denies                         [facility] Adult PT Treatment/Exercise - 06/26/17 0001      Shoulder Exercises: Supine   Flexion 15 reps;AAROM   Flexion Limitations with wand   Other Supine Exercises PNF D2 AAROM 15x     Shoulder Exercises: Standing   Other Standing Exercises wall walking x15 with cues for reduction of compensations      Shoulder Exercises: Therapy Ball   Flexion 10 reps   ABduction 10 reps   Right/Left 15 reps   Right/Left Limitations IR/ER  Shoulder Exercises: ROM/Strengthening   Other ROM/Strengthening Exercises wall walking 15x     Manual Therapy   Manual Therapy Soft tissue mobilization;Joint mobilization   Manual therapy comments Performed separatelty from all other interventions at end of session   Joint Mobilization L shoulder distraction/oscillation/PROM    Soft tissue mobilization Supine position Lt upper traps, biceps and triceps/ distal pectoral region with minimal spasms/tightness noted                  PT Short Term Goals - 06/05/17 1349      PT SHORT TERM GOAL #1   Title Pt will be independent with HEP and perform consistently to maximize her return to PLOF and decrease risk of reinjury.   Baseline 8/14: performing 2x/day   Time 4   Period Weeks   Status Achieved     PT SHORT TERM GOAL #2   Title Pt will be able to  perform L shoulder flexion and abd AROM to at least 90 deg to decrease pain and maximzie overall function at home.   Time 4   Period Weeks   Status On-going     PT SHORT TERM GOAL #3   Title Pt will report being able to sleep through the night without awakening due to pain in order to maximize recovery.   Baseline 8/14: awakes her 2x/night   Time 4   Period Weeks   Status On-going           PT Long Term Goals - 06/05/17 1350      PT LONG TERM GOAL #1   Title Pt will have L shoulder flexion and abd AROM to at least 150 deg each without pain in order to maximize pt's ability to dress and bathe.   Time 8   Period Weeks   Status On-going     PT LONG TERM GOAL #2   Title Pt will have at least 4+/5 strength in LUE in order to maximize her ability to perform ADLs and IADLs at home with no pain.   Time 8   Period Weeks   Status Partially Met     PT LONG TERM GOAL #3   Title Pt will have an improvement in her QuickDASH by 8% or > to demonstrate improved overall perceived function.   Time 8   Period Weeks   Status Achieved               Plan - 06/26/17 1511    Clinical Impression Statement Began session with manual soft tissue mobilization technqiues to address muscle knotting in Lt upper traps, triceps and biceps.  Following reports of recent MD apt and to continue PT for 2 more session then DC to HEP.  Reviewed compliance with HEP and gave pt list of local massage therapist to address soft tissue limitations.  Therex focus on AAROM/AROM to address strengthening deficits.  Pt does continue to show severe weakness with shoulder movements over head though does report increase ease with tasks over head including washing/brushing hair.     Rehab Potential Fair   PT Frequency 2x / week   PT Duration 8 weeks   PT Treatment/Interventions ADLs/Self Care Home Management;Cryotherapy;Electrical Stimulation;Iontophoresis 50m/ml Dexamethasone;Moist Heat;Ultrasound;Therapeutic  activities;Therapeutic exercise;Balance training;Neuromuscular re-education;Patient/family education;Manual techniques;Passive range of motion;Dry needling;Taping   PT Next Visit Plan Reassess next session.  conintue working on STM to L UE including triceps, distal pectorals, biceps; continue mobs and traction, ROM exercises with progression as appropriate. Cotninue ball rolls annd wall  walks.    PT Home Exercise Plan 7/18:  AROM wand flexion, IR, pendulums, shoulder rolls 7/23 3D thoracic excursions; 7/24: biceps stretch in doorway, pulleys; 8/14: ER doorway stretch 8/20: self triceps STM with ball       Patient will benefit from skilled therapeutic intervention in order to improve the following deficits and impairments:  Decreased activity tolerance, Decreased knowledge of precautions, Decreased range of motion, Decreased strength, Increased muscle spasms, Impaired perceived functional ability, Impaired UE functional use, Postural dysfunction, Pain  Visit Diagnosis: Acute pain of left shoulder  Other symptoms and signs involving the musculoskeletal system  Muscle weakness (generalized)  Abnormal posture     Problem List Patient Active Problem List   Diagnosis Date Noted  . Melena 12/05/2016  . Family hx of colon cancer 06/08/2016  . OA (osteoarthritis) of knee 12/27/2015  . Screening for osteoporosis 02/25/2014  . 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 Allergic reaction 05/13/2013  . Hyperlipemia 04/24/2013  . Hypertension 04/24/2013   Ihor Austin, [place]; [place]  Aldona Lento 06/26/2017, 3:24 PM  [place], [place], [address] Phone: [phone]   Fax:  [phone]  Name: [name] MRN: [id number] Date of Birth: 02/18/1941

## 2017-06-28 ENCOUNTER — Encounter (HOSPITAL_COMMUNITY): Payer: Self-pay

## 2017-06-28 ENCOUNTER — Ambulatory Visit (HOSPITAL_COMMUNITY): Payer: Medicare Other

## 2017-06-28 DIAGNOSIS — R293 Abnormal posture: Secondary | ICD-10-CM | POA: Diagnosis not present

## 2017-06-28 DIAGNOSIS — M25512 Pain in left shoulder: Secondary | ICD-10-CM | POA: Diagnosis not present

## 2017-06-28 DIAGNOSIS — R29898 Other symptoms and signs involving the musculoskeletal system: Secondary | ICD-10-CM

## 2017-06-28 DIAGNOSIS — M6281 Muscle weakness (generalized): Secondary | ICD-10-CM

## 2017-06-28 NOTE — Therapy (Signed)
[facility] [address] [place], , [address] Phone: [phone]   Fax:  [phone]  Physical Therapy Treatment/Discharge Summary  Patient Details  Name: [name] MRN: [id number] Date of Birth: 06/11/1941 Referring Provider: Dr. Collins  Encounter Date: 06/28/2017      PT End of Session - 06/28/17 1349    Visit Number 14   Number of Visits 17   Date for PT Re-Evaluation 06/29/17   Authorization Type [employer]   Authorization Time Period 05/04/17 to 06/29/17   Authorization - Visit Number 14   Authorization - Number of Visits 18   PT Start Time 1349   PT Stop Time 1425   PT Time Calculation (min) 36 min   Activity Tolerance Patient tolerated treatment well;No increased pain   Behavior During Therapy WFL for tasks assessed/performed      Past Medical History:  Diagnosis Date  . Allergy   . Angioedema   . Arthritis   . GERD (gastroesophageal reflux disease)   . Headache   . Hx of echocardiogram 10/2005   normal  . Hyperlipidemia   . Hypertension   . Thyroid disease    treated with radiation in 1980s     Past Surgical History:  Procedure Laterality Date  . ABDOMINAL HYSTERECTOMY    . CHOLECYSTECTOMY    . COLONOSCOPY  06/15/2011   Procedure: COLONOSCOPY;  Surgeon: Najeeb U Rehman, MD;  Location: [facility];  Service: Endoscopy;  Laterality: N/A;  . COLONOSCOPY N/A 07/27/2016   Procedure: COLONOSCOPY;  Surgeon: Najeeb U Rehman, MD;  Location: [facility];  Service: Endoscopy;  Laterality: N/A;  230  . ESOPHAGOGASTRODUODENOSCOPY N/A 12/08/2016   Procedure: ESOPHAGOGASTRODUODENOSCOPY (EGD);  Surgeon: Najeeb U Rehman, MD;  Location: [facility];  Service: Endoscopy;  Laterality: N/A;  120  . EYE SURGERY     removed cyst from right eye in june 2015. sept 9th surgery for eye lid drop on both eyes  . JOINT REPLACEMENT    . REPLACEMENT TOTAL KNEE Right 2000  . TOTAL KNEE ARTHROPLASTY Left 12/27/2015    Procedure: LEFT TOTAL KNEE ARTHROPLASTY;  Surgeon: Frank Aluisio, MD;  Location: [facility];  Service: Orthopedics;  Laterality: Left;  . TUBAL LIGATION      There were no vitals filed for this visit.      Subjective Assessment - 06/28/17 1350    Subjective Pt states that she is "doing" today. Her shoulder feels pretty good, she still doesn't have the ROM like she wants. She cancelled her MRI because she got a shot in her shoulder last Wednesday and it helped. Her doctor told her that if she felt like the shot helped then she could cancel the MRI so that's what she did.    Patient Stated Goals get 100% back   Currently in Pain? No/denies            [facility] PT Assessment - 06/28/17 0001      AROM   Left Shoulder Flexion 124 Degrees  was 53   Left Shoulder ABduction 85 Degrees  was 46     PROM   Overall PROM Comments flexion and abd WFL for PROM, min feelings of stiffness     Strength   Right Shoulder Flexion 5/5   Right Shoulder ABduction 4+/5   Left Shoulder Internal Rotation 4+/5  through available range   Left Shoulder External Rotation 4+/5  through available range                PT Education - 06/28/17 1425    Education provided Yes   Education Details discharge plans, HEP, can return with referral if she has a decline in function   Person(s) Educated Patient   Methods Explanation;Demonstration;Handout   Comprehension Verbalized understanding;Returned demonstration          PT Short Term Goals - 06/28/17 1354      PT SHORT TERM GOAL #1   Title Pt will be independent with HEP and perform consistently to maximize her return to PLOF and decrease risk of reinjury.   Baseline 8/14: performing 2x/day   Time 4   Period Weeks   Status Achieved     PT SHORT TERM GOAL #2   Title Pt will be able to perform L shoulder flexion and abd AROM to at least 90 deg to decrease pain and maximzie overall function at home.   Baseline 9/6: flexion 124 with UT compensation, abd  85 deg   Time 4   Period Weeks   Status Partially Met     PT SHORT TERM GOAL #3   Title Pt will report being able to sleep through the night without awakening due to pain in order to maximize recovery.   Baseline 9/6: able to sleep through the night   Time 4   Period Weeks   Status Achieved           PT Long Term Goals - 06/28/17 1354      PT LONG TERM GOAL #1   Title Pt will have L shoulder flexion and abd AROM to at least 150 deg each without pain in order to maximize pt's ability to dress and bathe.   Baseline 9/6: flexion 124 with UT compensation, abd 85 deg   Time 8   Period Weeks   Status On-going     PT LONG TERM GOAL #2   Title Pt will have at least 4+/5 strength in LUE in order to maximize her ability to perform ADLs and IADLs at home with no pain.   Time 8   Period Weeks   Status Partially Met     PT LONG TERM GOAL #3   Title Pt will have an improvement in her QuickDASH by 8% or > to demonstrate improved overall perceived function.   Time 8   Period Weeks   Status Achieved               Plan - 06/28/17 1426    Clinical Impression Statement Reassessment performed this date. Pt as made good improvements since last treatment session as her AROM has improved significantly, though she has to compensate with UT throughout elevation. Her strength is still limited as well but it has improved since her initial evaluation. Pt states she got a shot in her shoulder last week and it has helped with the pain so she cancelled her MRI. Pt reported T1 light touch testing as no difference between her UEs. PT explained to pt that she would beneift from continued therapy but she wished to be discharged to and HEP to see how she does with that. She was given an updated HEP  for ROM and strengthening and she demonstrated understanding. PT explained that if pt has a decline in function she will to get a referral to return to therapy; she verbalized understanding.    Rehab  Potential Fair   PT Frequency 2x / week   PT Duration 8 weeks   PT Treatment/Interventions ADLs/Self Care Home Management;Cryotherapy;Electrical Stimulation;Iontophoresis 49m/ml Dexamethasone;Moist  Heat;Ultrasound;Therapeutic activities;Therapeutic exercise;Balance training;Neuromuscular re-education;Patient/family education;Manual techniques;Passive range of motion;Dry needling;Taping   PT Next Visit Plan discharged   PT Home Exercise Plan 7/18:  AROM wand flexion, IR, pendulums, shoulder rolls 7/23 3D thoracic excursions; 7/24: biceps stretch in doorway, pulleys; 8/14: ER doorway stretch 8/20: self triceps STM with ball; 2023/07/10: AAROM with cane for flexion and abd, seated flexion on counter, standing scap retraction, low rows, IR/ER with towel   Consulted and Agree with Plan of Care Patient      Patient will benefit from skilled therapeutic intervention in order to improve the following deficits and impairments:  Decreased activity tolerance, Decreased knowledge of precautions, Decreased range of motion, Decreased strength, Increased muscle spasms, Impaired perceived functional ability, Impaired UE functional use, Postural dysfunction, Pain  Visit Diagnosis: Acute pain of left shoulder  Other symptoms and signs involving the musculoskeletal system  Muscle weakness (generalized)  Abnormal posture       G-Codes - 07-09-2017 1430    Functional Limitation Mobility: Walking and moving around   Mobility: Walking and Moving Around Goal Status [phone]) At least 20 percent but less than 40 percent impaired, limited or restricted   Mobility: Walking and Moving Around Discharge Status [phone]) At least 40 percent but less than 60 percent impaired, limited or restricted      Problem List Patient Active Problem List   Diagnosis Date Noted  . Melena 12/05/2016  . Family hx of colon cancer 06/08/2016  . OA (osteoarthritis) of knee 12/27/2015  . Screening for osteoporosis 02/25/2014  .  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 Allergic reaction 05/13/2013  . Hyperlipemia 04/24/2013  . Hypertension 04/24/2013     PHYSICAL THERAPY DISCHARGE SUMMARY  Visits from Start of Care: 14  Current functional level related to goals / functional outcomes: See clinical impression above   Remaining deficits: See clinical impression above   Education / Equipment: Updated HEP Plan: Patient agrees to discharge.  Patient goals were partially met. Patient is being discharged due to being pleased with the current functional level.  ?????       Geraldine Solar PT, [facility] [address] [place], [place], [address] Phone: [phone]   Fax:  [phone]  Name: [name] MRN: [id number] Date of Birth: 1941/01/21

## 2017-06-28 NOTE — Patient Instructions (Signed)
  PROM Shoulder Flexion on Table  Rest your affected shoulder's hand on a counter or table as pictured. Lean forward allowing your hand to slide forward increasing the range of motion at your shoulder. Lean back and repeat. Perform this exercise in a relatively pain-free range of motion  Perform 1x/day, 20-30 reps     Shoulder Abduction Stretch  Start position holding wand palm up in hand of arm to be stretched, palm down in opposite hand. Keep affected arm straight while pushing to the side and up with opposing arm as far as affected arm will stretch without pain.    Perform 1x/day, 3 sets of 10-15 reps   shoulder flexion with wand  start with cane in both hands,arms down below waist.then raise cane using both arms with non injured shoulder assisting injured shoulder to 90 degrees and back down slowly  Perform 1x/day, 3 sets of 10-15 reps   ELASTIC BAND SCAPULAR RETRACTIONS WITH MINI SHOULDER EXTENSIONS  While holding an elastic band with both arms in front of you with your elbows straight, squeeze your shoulder blades together as you pull the band back. Be sure your shoulders do not raise up.    Perform 1x/day, 3 sets of 10-15 reps   Scapular Retraction  Wrap an elastic band around a door knob or banister. Grab the ends of the band with both hands with your arms extended. With good posture, pull the band backwards and squeeze your shoulder blades together for 3 seconds. Make sure your elbows stay close to your body.    Perform 1x/day, 3 sets of 10-15 reps   ELASTIC BAND SHOULDER EXTERNAL ROTATION - ER  While holding an elastic band at your side with your elbow bent, start with your hand near your stomach and then pull the band away. Keep your elbow at your side the entire time.  Perform 1x/day, 3 sets of 10-15 reps   ELASTIC BAND SHOULDER INTERNAL ROTATION - IR  While holding an elastic band at your side with your elbow bent, start with your hand away from your stomach,  then pull the band towards your stomach. Keep your elbow near your side the entire time.   Perform 1x/day, 3 sets of 10-15 reps

## 2017-07-18 ENCOUNTER — Other Ambulatory Visit: Payer: Self-pay | Admitting: Family Medicine

## 2017-09-28 ENCOUNTER — Other Ambulatory Visit: Payer: Self-pay | Admitting: Family Medicine

## 2017-10-25 ENCOUNTER — Other Ambulatory Visit: Payer: Self-pay | Admitting: Family Medicine

## 2017-11-02 ENCOUNTER — Encounter: Payer: Self-pay | Admitting: Family Medicine

## 2017-11-02 ENCOUNTER — Ambulatory Visit (INDEPENDENT_AMBULATORY_CARE_PROVIDER_SITE_OTHER): Payer: Medicare Other | Admitting: Family Medicine

## 2017-11-02 VITALS — BP 122/80 | Temp 98.5°F | Ht 68.0 in | Wt 196.0 lb

## 2017-11-02 DIAGNOSIS — E785 Hyperlipidemia, unspecified: Secondary | ICD-10-CM | POA: Diagnosis not present

## 2017-11-02 DIAGNOSIS — I10 Essential (primary) hypertension: Secondary | ICD-10-CM | POA: Diagnosis not present

## 2017-11-02 DIAGNOSIS — J019 Acute sinusitis, unspecified: Secondary | ICD-10-CM | POA: Diagnosis not present

## 2017-11-02 MED ORDER — VERAPAMIL HCL ER 240 MG PO TBCR: EXTENDED_RELEASE_TABLET | ORAL | 1 refills | Status: DC

## 2017-11-02 MED ORDER — BISOPROLOL-HYDROCHLOROTHIAZIDE 2.5-6.25 MG PO TABS: ORAL_TABLET | ORAL | 1 refills | Status: DC

## 2017-11-02 MED ORDER — AZITHROMYCIN 250 MG PO TABS: ORAL_TABLET | ORAL | 0 refills | Status: DC

## 2017-11-02 NOTE — Progress Notes (Signed)
   Subjective:    Patient ID: [name], female    DOB: 1941/06/07, 77 y.o.   MRN: [id number]  HPI Patient is here today for a follow up on Htn. She is taking Verapamil 240 mg one daily. She tries to eat healthy and she gets some exercise.She does not see any specialists.  She thinks she has a sinus head ache, when she blows nose it is tinged with blood. Taking goody powders seem to help.  She denies any one-sided numbness or weak or tingling.  Denies any wheezing difficulty breathing. Review of Systems  Constitutional: Negative for activity change, appetite change and fatigue.  HENT: Negative for congestion and rhinorrhea.   Eyes: Negative for discharge.  Respiratory: Negative for cough, chest tightness and wheezing.   Cardiovascular: Negative for chest pain.  Gastrointestinal: Negative for abdominal pain, blood in stool and vomiting.  Endocrine: Negative for polyphagia.  Genitourinary: Negative for difficulty urinating and frequency.  Musculoskeletal: Negative for neck pain.  Skin: Negative for color change.  Allergic/Immunologic: Negative for environmental allergies and food allergies.  Neurological: Positive for headaches. Negative for weakness.  Psychiatric/Behavioral: Negative for agitation and behavioral problems.       Objective:   Physical Exam  Constitutional: She appears well-developed and well-nourished. No distress.  HENT:  Head: Normocephalic and atraumatic.  Eyes: Right eye exhibits no discharge. Left eye exhibits no discharge.  Neck: No tracheal deviation present.  Cardiovascular: Normal rate, regular rhythm and normal heart sounds.  No murmur heard. Pulmonary/Chest: Effort normal and breath sounds normal. No respiratory distress. She has no wheezes. She has no rales.  Musculoskeletal: She exhibits no edema.  Lymphadenopathy:    She has no cervical adenopathy.  Neurological: She is alert. She exhibits normal muscle tone.  Skin: Skin is warm and dry. No  erythema.  Psychiatric: Her behavior is normal.  Vitals reviewed.         Assessment & Plan:  Blood pressure good control continue current measures  Sinus congestion drainage coughing sinus pressure and headache Zithromax as directed follow-up if progressive troubles  Hyperlipidemia continue red yeast rice extract patient does not tolerate statins  Follow-up 6 months sooner problems

## 2018-01-16 DIAGNOSIS — E785 Hyperlipidemia, unspecified: Secondary | ICD-10-CM | POA: Diagnosis not present

## 2018-01-16 DIAGNOSIS — I10 Essential (primary) hypertension: Secondary | ICD-10-CM | POA: Diagnosis not present

## 2018-01-17 LAB — BASIC METABOLIC PANEL
BUN/Creatinine Ratio: 16 (ref 12–28)
BUN: 13 mg/dL (ref 8–27)
CALCIUM: 9.3 mg/dL (ref 8.7–10.3)
CHLORIDE: 105 mmol/L (ref 96–106)
CO2: 24 mmol/L (ref 20–29)
Creatinine, Ser: 0.83 mg/dL (ref 0.57–1.00)
GFR calc non Af Amer: 68 mL/min/{1.73_m2} (ref >59)
GFR, EST AFRICAN AMERICAN: 78 mL/min/{1.73_m2} (ref >59)
Glucose: 83 mg/dL (ref 65–99)
POTASSIUM: 4.3 mmol/L (ref 3.5–5.2)
SODIUM: 145 mmol/L — AB (ref 134–144)

## 2018-01-17 LAB — LIPID PANEL
CHOL/HDL RATIO: 4.4 ratio (ref 0.0–4.4)
Cholesterol, Total: 191 mg/dL (ref 100–199)
HDL: 43 mg/dL (ref >39)
LDL CALC: 126 mg/dL — AB (ref 0–99)
TRIGLYCERIDES: 111 mg/dL (ref 0–149)
VLDL CHOLESTEROL CAL: 22 mg/dL (ref 5–40)

## 2018-01-21 ENCOUNTER — Encounter: Payer: Self-pay | Admitting: Family Medicine

## 2018-05-01 ENCOUNTER — Ambulatory Visit: Payer: Medicare Other | Admitting: Family Medicine

## 2018-05-07 ENCOUNTER — Encounter: Payer: Self-pay | Admitting: Family Medicine

## 2018-05-07 ENCOUNTER — Ambulatory Visit (INDEPENDENT_AMBULATORY_CARE_PROVIDER_SITE_OTHER): Payer: Medicare Other | Admitting: Family Medicine

## 2018-05-07 VITALS — BP 138/70 | Ht 68.0 in | Wt 196.0 lb

## 2018-05-07 DIAGNOSIS — I10 Essential (primary) hypertension: Secondary | ICD-10-CM

## 2018-05-07 DIAGNOSIS — Z1231 Encounter for screening mammogram for malignant neoplasm of breast: Secondary | ICD-10-CM

## 2018-05-07 MED ORDER — MUPIROCIN 2 % EX OINT: 1.0000 "application " | TOPICAL_OINTMENT | Freq: Every day | CUTANEOUS | 2 refills | Status: DC

## 2018-05-07 MED ORDER — VERAPAMIL HCL ER 240 MG PO TBCR: EXTENDED_RELEASE_TABLET | ORAL | 1 refills | Status: DC

## 2018-05-07 MED ORDER — BISOPROLOL-HYDROCHLOROTHIAZIDE 2.5-6.25 MG PO TABS: ORAL_TABLET | ORAL | 1 refills | Status: DC

## 2018-05-07 MED ORDER — ZOSTER VAC RECOMB ADJUVANTED 50 MCG/0.5ML IM SUSR: 0.5000 mL | Freq: Once | INTRAMUSCULAR | 1 refills | Status: AC

## 2018-05-07 NOTE — Progress Notes (Signed)
   Subjective:    Patient ID: [name], female    DOB: Oct 04, 1941, 77 y.o.   MRN: [id number]  Hypertension  This is a chronic problem. Pertinent negatives include no chest pain or shortness of breath. There are no compliance problems (takes meds every day, eats healthy, exercises).    Needs refill on mupirocin. Uses for bug bites.  She reports multiple bug bites that blister and she needs his Bactroban on this to prevent infection  Patient for blood pressure check up.  The patient does have hypertension.  The patient is on medication.  Patient relates compliance with meds. Todays BP reviewed with the patient. Patient denies issues with medication. Patient relates reasonable diet. Patient tries to minimize salt. Patient aware of BP goals.   Review of Systems  Constitutional: Negative for activity change, appetite change and fatigue.  HENT: Negative for congestion and rhinorrhea.   Respiratory: Negative for cough and shortness of breath.   Cardiovascular: Negative for chest pain and leg swelling.  Gastrointestinal: Negative for abdominal pain and diarrhea.  Endocrine: Negative for polydipsia and polyphagia.  Skin: Negative for color change.  Neurological: Negative for weakness.  Psychiatric/Behavioral: Negative for confusion.       Objective:   Physical Exam  Constitutional: She appears well-nourished. No distress.  HENT:  Head: Normocephalic and atraumatic.  Eyes: Right eye exhibits no discharge. Left eye exhibits no discharge.  Cardiovascular: Normal rate, regular rhythm and normal heart sounds.  No murmur heard. Pulmonary/Chest: Effort normal and breath sounds normal. No respiratory distress.  Musculoskeletal: She exhibits no edema.  Lymphadenopathy:    She has no cervical adenopathy.  Neurological: She is alert. She exhibits normal muscle tone.  Psychiatric: Her behavior is normal.  Vitals reviewed.         Assessment & Plan:  HTN- Patient was seen today as  part of a visit regarding hypertension. The importance of healthy diet and regular physical activity was discussed. The importance of compliance with medications discussed.  Ideal goal is to keep blood pressure low elevated levels certainly below 702/63 when possible.  The patient was counseled that keeping blood pressure under control lessen his risk of complications.  The importance of regular follow-ups was discussed with the patient.  Low-salt diet such as DASH recommended.  Regular physical activity was recommended as well.  Patient was advised to keep regular follow-ups.  15 minutes was spent with patient today discussing healthcare issues which they came.  More than 50% of this visit-total duration of visit-was spent in counseling and coordination of care.  Please see diagnosis regarding the focus of this coordination and care  Bactroban may use small amount on bug bites with a blister up  Mammogram recommended

## 2018-05-19 ENCOUNTER — Encounter (HOSPITAL_COMMUNITY): Payer: Self-pay | Admitting: Emergency Medicine

## 2018-05-19 ENCOUNTER — Other Ambulatory Visit: Payer: Self-pay

## 2018-05-19 ENCOUNTER — Emergency Department (HOSPITAL_COMMUNITY)
Admission: EM | Admit: 2018-05-19 | Discharge: 2018-05-19 | Disposition: A | Discharge status: Home / Self Care | Payer: Medicare Other | Attending: Emergency Medicine | Admitting: Emergency Medicine

## 2018-05-19 DIAGNOSIS — T783XXA Angioneurotic edema, initial encounter: Secondary | ICD-10-CM | POA: Diagnosis not present

## 2018-05-19 DIAGNOSIS — Z96651 Presence of right artificial knee joint: Secondary | ICD-10-CM | POA: Diagnosis not present

## 2018-05-19 DIAGNOSIS — E079 Disorder of thyroid, unspecified: Secondary | ICD-10-CM | POA: Insufficient documentation

## 2018-05-19 DIAGNOSIS — I10 Essential (primary) hypertension: Secondary | ICD-10-CM | POA: Diagnosis not present

## 2018-05-19 DIAGNOSIS — R220 Localized swelling, mass and lump, head: Secondary | ICD-10-CM | POA: Diagnosis not present

## 2018-05-19 DIAGNOSIS — Z79899 Other long term (current) drug therapy: Secondary | ICD-10-CM | POA: Insufficient documentation

## 2018-05-19 DIAGNOSIS — Z96652 Presence of left artificial knee joint: Secondary | ICD-10-CM | POA: Insufficient documentation

## 2018-05-19 MED ORDER — FAMOTIDINE IN NACL 20-0.9 MG/50ML-% IV SOLN
20.0000 mg | INTRAVENOUS | Status: AC
  Administered 2018-05-19: 20 mg via INTRAVENOUS
  Filled 2018-05-19: qty 50

## 2018-05-19 MED ORDER — METHYLPREDNISOLONE SODIUM SUCC 125 MG IJ SOLR
125.0000 mg | Freq: Once | INTRAMUSCULAR | Status: AC
  Administered 2018-05-19: 125 mg via INTRAVENOUS
  Filled 2018-05-19: qty 2

## 2018-05-19 MED ORDER — FAMOTIDINE 20 MG PO TABS: 20.0000 mg | ORAL_TABLET | Freq: Two times a day (BID) | ORAL | 0 refills | Status: DC

## 2018-05-19 MED ORDER — PREDNISONE 20 MG PO TABS: 40.0000 mg | ORAL_TABLET | Freq: Every day | ORAL | 0 refills | Status: DC

## 2018-05-19 MED ORDER — DIPHENHYDRAMINE HCL 50 MG/ML IJ SOLN
25.0000 mg | Freq: Once | INTRAMUSCULAR | Status: AC
  Administered 2018-05-19: 25 mg via INTRAVENOUS
  Filled 2018-05-19: qty 1

## 2018-05-19 NOTE — Discharge Instructions (Signed)
If you should develop increasing difficulty breathing, swallowing, speaking or worsening swelling of your face or lips you should return immediately to the emergency department See your doctor within 48 hours for recheck Prednisone, 40 mg daily for 5 days Benadryl, 25 mg every 6 hours as needed for itching or swelling Pepcid, 20 mg twice a day for the next week

## 2018-05-19 NOTE — ED Triage Notes (Signed)
Pt states that she woke up with swelling in her FACE getting worse as the day has gone on

## 2018-05-19 NOTE — ED Provider Notes (Signed)
[facility] EMERGENCY DEPARTMENT Provider Note   CSN: [id number] Arrival date & time: 05/19/18  1339     History   Chief Complaint Chief Complaint  Patient presents with  . Facial Swelling    HPI [name] is a 77 y.o. female.  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  She reports that she awoke this morning and had some swelling of her lips and the right side of her face.  She states that she has had ongoing swelling, there is no swelling of the throat, no abdominal fullness, no chest pain or palpitations.  The symptoms are moderate, she has had much worse with tongue swelling in the past, she has never required intubation or intensive care unit stay.  Past Medical History:  Diagnosis Date  . Allergy   . Angioedema   . Arthritis   . GERD (gastroesophageal reflux disease)   . Headache   . Hx of echocardiogram 10/2005   normal  . Hyperlipidemia   . Hypertension   . 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 Melena 12/05/2016  . Family hx of colon cancer 06/08/2016  . OA (osteoarthritis) of knee 12/27/2015  . Screening for osteoporosis 02/25/2014  . 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 Allergic reaction 05/13/2013  . Hyperlipemia 04/24/2013  . Hypertension 04/24/2013    Past Surgical History:  Procedure Laterality Date  . ABDOMINAL HYSTERECTOMY    . CHOLECYSTECTOMY    . COLONOSCOPY  06/15/2011   Procedure: COLONOSCOPY;  Surgeon: Rogene Houston, MD;  [facility]: [facility];  Service: Endoscopy;  Laterality: N/A;  . COLONOSCOPY N/A 07/27/2016   Procedure:  COLONOSCOPY;  Surgeon: Rogene Houston, MD;  Location: [facility];  Service: Endoscopy;  Laterality: N/A;  230  . ESOPHAGOGASTRODUODENOSCOPY N/A 12/08/2016   Procedure: ESOPHAGOGASTRODUODENOSCOPY (EGD);  Surgeon: Rogene Houston, MD;  Location: [facility];  Service: Endoscopy;  Laterality: N/A;  120  . EYE SURGERY     removed cyst from right eye in june 2015. sept 9th surgery for eye lid drop on both eyes  . JOINT REPLACEMENT    . REPLACEMENT TOTAL KNEE Right 2000  . 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 TUBAL LIGATION       OB History   None      Home Medications    Prior to Admission medications   Medication Sig Start Date End Date Taking? Authorizing Provider  bisoprolol-hydrochlorothiazide (ZIAC) 2.5-6.25 MG tablet TAKE ONE (1) TABLET BY MOUTH EVERY DAY 05/07/18  Yes Luking, Elayne Snare, MD  diphenhydrAMINE (BENADRYL) 25 MG tablet Take 25 mg by mouth every 6 (six) hours as needed for allergies (swelling).   Yes [provider]  mupirocin ointment (BACTROBAN) 2 % Apply 1 application topically daily. As needed 05/07/18  Yes Luking, Elayne Snare, MD  Red Yeast Rice Extract (RED YEAST RICE PO) Take by mouth. 2 BID   Yes [provider]  RESTASIS MULTIDOSE 0.05 % ophthalmic emulsion Place 1 drop into both eyes 2 (two) times daily.  01/12/17  Yes [provider]  verapamil (CALAN-SR) 240 MG CR tablet TAKE ONE (1) TABLET BY MOUTH EVERY DAY 05/07/18  Yes Luking, Elayne Snare, MD  famotidine (PEPCID) 20 MG tablet Take 1 tablet (20 mg total) by mouth 2 (two) times daily. 05/19/18   Noemi Chapel, MD  predniSONE (DELTASONE) 20 MG tablet Take 2 tablets (40 mg total) by mouth daily. 05/19/18   Noemi Chapel, MD    Family History Family History  Problem Relation Age of Onset  . Hypertension Mother   . Coronary artery disease Mother   .  Hypertension Father   . Coronary artery disease Father   . Heart failure Sister   . Hypertension Sister   . Cancer Brother   . Diabetes Brother     Social History Social History   Tobacco Use  . Smoking status: Never Smoker  . Smokeless tobacco: Never Used  Substance Use Topics  . Alcohol use: No  . Drug use: No     Allergies   Other; Penicillins; Salmon [fish allergy]; and Statins   Review of Systems Review of Systems  All other systems reviewed and are negative.    Physical Exam Updated Vital Signs BP 130/62 (BP Location: Left Arm)   Pulse (!) 55   Temp 98 F (36.7 C) (Oral)   Resp (!) 185   Ht 5' 8.5" (1.74 m)   Wt 85.7 kg (189 lb)   SpO2 97%   BMI 28.32 kg/m   Physical Exam  Constitutional: She appears well-developed and well-nourished. No distress.  HENT:  Head: Normocephalic and atraumatic.  Mouth/Throat: Oropharynx is clear and moist. No oropharyngeal exudate.  There is mild angioedema of the right cheek in the bilateral lips upper and lower.  There is no intraoral swelling, the tongue is normal in appearance, there is normal phonation, uvula palate and tonsillar pillars appear normal.  No trismus or torticollis  Eyes: Pupils are equal, round, and reactive to light. Conjunctivae and EOM are normal. Right eye exhibits no discharge. Left eye exhibits no discharge. No scleral icterus.  Neck: Normal range of motion. Neck supple. No JVD present. No thyromegaly present.  Cardiovascular: Normal rate, regular rhythm, normal heart sounds and intact distal pulses. Exam reveals no gallop and no friction rub.  No murmur heard. Pulmonary/Chest: Effort normal and breath sounds normal. No respiratory distress. She has no wheezes. She has no rales.  Abdominal: Soft. Bowel sounds are normal. She exhibits no distension and no mass. There is no tenderness.  Musculoskeletal: Normal range of motion. She exhibits no edema or tenderness.  Lymphadenopathy:    She has no  cervical adenopathy.  Neurological: She is alert. Coordination normal.  Skin: Skin is warm and dry. No rash noted. No erythema.  Psychiatric: She has a normal mood and affect. Her behavior is normal.  Nursing note and vitals reviewed.    ED Treatments / Results  Labs (all labs ordered are listed, but only abnormal results are displayed) Labs Reviewed - No data to display  EKG None  Radiology No results found.  Procedures Procedures (including critical care time)  Medications Ordered in ED Medications  methylPREDNISolone sodium succinate (SOLU-MEDROL) 125 mg/2 mL injection 125 mg (125 mg Intravenous Given 05/19/18 1406)  famotidine (PEPCID) IVPB 20 mg premix (0 mg Intravenous Stopped 05/19/18 1445)  diphenhydrAMINE (BENADRYL) injection 25 mg (25 mg Intravenous Given 05/19/18  1406)     Initial Impression / Assessment and Plan / ED Course  I have reviewed the triage vital signs and the nursing notes.  Pertinent labs & imaging results that were available during my care of the patient were reviewed by me and considered in my medical decision making (see chart for details).  Clinical Course as of May 19 1724  Sun May 19, 2018  1446 Rechecked at 2:45 - no worsening of the swelling - no airway or throat / pharyngeal involvement.   [BM]  1559 Rechecked again, 3:55 PM, still doing well and feels like her swelling is getting slightly better.  Again no swelling of the oropharynx or the tongue   [BM]    Clinical Course User Index [BM] Noemi Chapel, MD   The patient's exam is consistent with recurrent angioedema, unfortunately at this point it is unclear what is caused her symptoms recurrently in the past, she has no ACE inhibitor on her medication list, she is on a beta-blocker, hydrochlorothiazide and verapamil and has been on this for quite some time.  We will proceed with Solu-Medrol Pepcid and Benadryl though the benefit of this interaction may be mild, the patient is willing to  undergo observation in the emergency department.  No indication for admission or advanced airway at this time.  The patient was rechecked again at 5:30 PM, she still appears well, swelling has gone down slightly, she feels comfortable for discharge and I agree that she should be ready to go, she agrees to return should symptoms worsen.  Final Clinical Impressions(s) / ED Diagnoses   Final diagnoses:  Angioedema, initial encounter    ED Discharge Orders        Ordered    predniSONE (DELTASONE) 20 MG tablet  Daily     05/19/18 1721    famotidine (PEPCID) 20 MG tablet  2 times daily     05/19/18 1721       Noemi Chapel, MD 05/19/18 1725

## 2018-05-19 NOTE — ED Notes (Signed)
Pt states that she feels better.

## 2018-05-22 ENCOUNTER — Ambulatory Visit (HOSPITAL_COMMUNITY)
Admission: RE | Admit: 2018-05-22 | Discharge: 2018-05-22 | Disposition: A | Discharge status: Home / Self Care | Payer: Medicare Other | Source: Ambulatory Visit | Attending: Family Medicine | Admitting: Family Medicine

## 2018-05-22 DIAGNOSIS — Z1231 Encounter for screening mammogram for malignant neoplasm of breast: Secondary | ICD-10-CM | POA: Insufficient documentation

## 2018-08-20 ENCOUNTER — Other Ambulatory Visit: Payer: Self-pay

## 2018-08-20 ENCOUNTER — Emergency Department (HOSPITAL_COMMUNITY)
Admission: EM | Admit: 2018-08-20 | Discharge: 2018-08-20 | Disposition: A | Discharge status: Home / Self Care | Payer: Medicare Other | Attending: Emergency Medicine | Admitting: Emergency Medicine

## 2018-08-20 ENCOUNTER — Encounter (HOSPITAL_COMMUNITY): Payer: Self-pay

## 2018-08-20 ENCOUNTER — Encounter: Payer: Self-pay | Admitting: Family Medicine

## 2018-08-20 ENCOUNTER — Ambulatory Visit (INDEPENDENT_AMBULATORY_CARE_PROVIDER_SITE_OTHER): Payer: Medicare Other | Admitting: Family Medicine

## 2018-08-20 ENCOUNTER — Telehealth: Payer: Self-pay | Admitting: Family Medicine

## 2018-08-20 ENCOUNTER — Emergency Department (HOSPITAL_COMMUNITY): Payer: Medicare Other

## 2018-08-20 VITALS — BP 124/70 | Temp 98.3°F | Ht 68.5 in | Wt 197.0 lb

## 2018-08-20 DIAGNOSIS — R270 Ataxia, unspecified: Secondary | ICD-10-CM

## 2018-08-20 DIAGNOSIS — Z96651 Presence of right artificial knee joint: Secondary | ICD-10-CM | POA: Diagnosis not present

## 2018-08-20 DIAGNOSIS — I10 Essential (primary) hypertension: Secondary | ICD-10-CM | POA: Insufficient documentation

## 2018-08-20 DIAGNOSIS — H8110 Benign paroxysmal vertigo, unspecified ear: Secondary | ICD-10-CM | POA: Diagnosis not present

## 2018-08-20 DIAGNOSIS — R42 Dizziness and giddiness: Secondary | ICD-10-CM | POA: Diagnosis not present

## 2018-08-20 DIAGNOSIS — R262 Difficulty in walking, not elsewhere classified: Secondary | ICD-10-CM | POA: Diagnosis not present

## 2018-08-20 DIAGNOSIS — Z96652 Presence of left artificial knee joint: Secondary | ICD-10-CM | POA: Diagnosis not present

## 2018-08-20 DIAGNOSIS — I6501 Occlusion and stenosis of right vertebral artery: Secondary | ICD-10-CM | POA: Diagnosis not present

## 2018-08-20 DIAGNOSIS — Z79899 Other long term (current) drug therapy: Secondary | ICD-10-CM | POA: Insufficient documentation

## 2018-08-20 LAB — URINALYSIS, ROUTINE W REFLEX MICROSCOPIC
BACTERIA UA: NONE SEEN
Bilirubin Urine: NEGATIVE
GLUCOSE, UA: NEGATIVE mg/dL
KETONES UR: NEGATIVE mg/dL
LEUKOCYTES UA: NEGATIVE
Nitrite: NEGATIVE
PROTEIN: NEGATIVE mg/dL
Specific Gravity, Urine: 1.008 (ref 1.005–1.030)
pH: 6 (ref 5.0–8.0)

## 2018-08-20 LAB — COMPREHENSIVE METABOLIC PANEL
ALT: 14 U/L (ref 0–44)
ANION GAP: 7 (ref 5–15)
AST: 16 U/L (ref 15–41)
Albumin: 4.5 g/dL (ref 3.5–5.0)
Alkaline Phosphatase: 79 U/L (ref 38–126)
BILIRUBIN TOTAL: 0.9 mg/dL (ref 0.3–1.2)
BUN: 14 mg/dL (ref 8–23)
CHLORIDE: 106 mmol/L (ref 98–111)
CO2: 27 mmol/L (ref 22–32)
Calcium: 9.2 mg/dL (ref 8.9–10.3)
Creatinine, Ser: 0.74 mg/dL (ref 0.44–1.00)
GLUCOSE: 89 mg/dL (ref 70–99)
POTASSIUM: 3.6 mmol/L (ref 3.5–5.1)
Sodium: 140 mmol/L (ref 135–145)
Total Protein: 8 g/dL (ref 6.5–8.1)

## 2018-08-20 LAB — CBC WITH DIFFERENTIAL/PLATELET
Abs Immature Granulocytes: 0.01 10*3/uL (ref 0.00–0.07)
Basophils Absolute: 0 10*3/uL (ref 0.0–0.1)
Basophils Relative: 1 %
Eosinophils Absolute: 0.2 10*3/uL (ref 0.0–0.5)
Eosinophils Relative: 4 %
HEMATOCRIT: 44.8 % (ref 36.0–46.0)
Hemoglobin: 14.2 g/dL (ref 12.0–15.0)
IMMATURE GRANULOCYTES: 0 %
LYMPHS ABS: 1.4 10*3/uL (ref 0.7–4.0)
Lymphocytes Relative: 22 %
MCH: 29.8 pg (ref 26.0–34.0)
MCHC: 31.7 g/dL (ref 30.0–36.0)
MCV: 93.9 fL (ref 80.0–100.0)
MONOS PCT: 7 %
Monocytes Absolute: 0.4 10*3/uL (ref 0.1–1.0)
NEUTROS ABS: 4 10*3/uL (ref 1.7–7.7)
NEUTROS PCT: 66 %
PLATELETS: 209 10*3/uL (ref 150–400)
RBC: 4.77 MIL/uL (ref 3.87–5.11)
RDW: 13 % (ref 11.5–15.5)
WBC: 6 10*3/uL (ref 4.0–10.5)
nRBC: 0 % (ref 0.0–0.2)

## 2018-08-20 MED ORDER — MECLIZINE HCL 12.5 MG PO TABS
25.0000 mg | ORAL_TABLET | Freq: Once | ORAL | Status: AC
  Administered 2018-08-20: 25 mg via ORAL
  Filled 2018-08-20: qty 2

## 2018-08-20 MED ORDER — MECLIZINE HCL 25 MG PO TABS: 25.0000 mg | ORAL_TABLET | Freq: Three times a day (TID) | ORAL | 0 refills | Status: DC | PRN

## 2018-08-20 MED ORDER — ONDANSETRON 4 MG PO TBDP: 4.0000 mg | ORAL_TABLET | Freq: Three times a day (TID) | ORAL | 0 refills | Status: DC | PRN

## 2018-08-20 MED ORDER — ONDANSETRON HCL 4 MG/2ML IJ SOLN
4.0000 mg | Freq: Once | INTRAMUSCULAR | Status: AC
  Administered 2018-08-20: 4 mg via INTRAVENOUS
  Filled 2018-08-20: qty 2

## 2018-08-20 MED ORDER — SODIUM CHLORIDE 0.9 % IV BOLUS
1000.0000 mL | Freq: Once | INTRAVENOUS | Status: AC
  Administered 2018-08-20: 1000 mL via INTRAVENOUS

## 2018-08-20 NOTE — ED Notes (Signed)
Pt to MRI

## 2018-08-20 NOTE — ED Notes (Signed)
Ambulated to restroom with standby assist

## 2018-08-20 NOTE — Telephone Encounter (Signed)
Patient was seen in the ER after we saw her she was having severe ataxia vertigo concerning for the possibility of a stroke MRI came back negative patient was released to home on Tuesday evening Please reach out to the patient I would like to do a follow-up visit on Thursday morning if that is possible for her otherwise on Friday

## 2018-08-20 NOTE — ED Triage Notes (Signed)
Pt has been having dizziness and sick on her stomach today once she woke up. After lunch, pt vomited. Dr. Wolfgang Phoenix is concerned for a possible vertebral stroke. Pt states she walks with an unsteady gait.

## 2018-08-20 NOTE — Progress Notes (Signed)
   Subjective:    Patient ID: [name], female    DOB: 12/04/1940, 77 y.o.   MRN: [id number]  HPIdizziness, headache and vomiting. Started today. Has not tried any treatments. This patient started off this morning getting up and finding is unsteady balance she also relates she felt like the room was swaying on her.  She did have some nausea she tried to go her morning she went to the senior center and then she had lunch after lunch she got nauseous and threw up she states she feels off balance and she relates some moderate headache that started last evening She typically does not get headaches She does states she still feels nauseous and on balance she also states she feels weak generally but no focal weakness or numbness She denies the room spinning on her PMH HTN   Review of Systems  Constitutional: Negative for activity change, fatigue and fever.  HENT: Negative for congestion and rhinorrhea.   Respiratory: Negative for cough, chest tightness and shortness of breath.   Cardiovascular: Negative for chest pain and leg swelling.  Gastrointestinal: Negative for abdominal pain and nausea.  Skin: Negative for color change.  Neurological: Positive for dizziness and headaches.  Psychiatric/Behavioral: Negative for agitation and behavioral problems.       Objective:   Physical Exam  Constitutional: She appears well-nourished. No distress.  HENT:  Head: Normocephalic and atraumatic.  Eyes: Right eye exhibits no discharge. Left eye exhibits no discharge.  Neck: No tracheal deviation present.  Cardiovascular: Normal rate, regular rhythm and normal heart sounds.  No murmur heard. Pulmonary/Chest: Effort normal and breath sounds normal. No respiratory distress.  Musculoskeletal: She exhibits no edema.  Lymphadenopathy:    She has no cervical adenopathy.  Neurological: She is alert. Coordination normal.  Skin: Skin is warm and dry.  Psychiatric: She has a normal mood and affect.  Her behavior is normal.  Vitals reviewed.  Romberg-she wavers she has a difficult time holding her balance Horizontal nystagmus minimal in both directions Visual gaze/head impulse test-patient has inability to keep her eyes locked on the examiner while turning her head gently from left to right Moderate ataxia with walking with a short shuffling gait which is very abnormal for this patient  Bi-lateral strength is good     Assessment & Plan:  Even though this could be in her ear there are 2 any signs of point toward the possibility of a small stroke Obviously missing a stroke would not be good I believe the patient would be best served by going ahead seen by the ER and having MRI possibly MRA If this work-up is negative then we will need to follow-up this week If it is positive then she will need further interventions via neurology Family understands they will take her to the ER ER doctor spoke

## 2018-08-20 NOTE — ED Provider Notes (Signed)
[facility] EMERGENCY DEPARTMENT Provider Note   CSN: [id number] Arrival date & time: 08/20/18  1517     History   Chief Complaint Chief Complaint  Patient presents with  . MRI    HPI [name] is a 77 y.o. female.  Pt presents to the ED today with dizziness.  Pt said sx started today after waking up.  She feels like the room is spinning and that she can't walk correctly.  She has had BPV in the past.  She has not yet taken anything for her sx.  She did go to her pcp's office who recommended that she come here for a MRI to r/o vertebral CVA.  Pt said sx have improved, but she is still feeling dizzy.     Past Medical History:  Diagnosis Date  . Allergy   . Angioedema   . Arthritis   . GERD (gastroesophageal reflux disease)   . Headache   . Hx of echocardiogram 10/2005   normal  . Hyperlipidemia   . Hypertension   . 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 Melena 12/05/2016  . Family hx of colon cancer 06/08/2016  . OA (osteoarthritis) of knee 12/27/2015  . Screening for osteoporosis 02/25/2014  . 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 Allergic reaction 05/13/2013  . Hyperlipemia 04/24/2013  . Hypertension 04/24/2013    Past Surgical History:  Procedure Laterality Date  . ABDOMINAL HYSTERECTOMY    . CHOLECYSTECTOMY    . COLONOSCOPY  06/15/2011   Procedure: COLONOSCOPY;  Surgeon: Rogene Houston, MD;  Location: [facility];  Service: Endoscopy;  Laterality: N/A;  . COLONOSCOPY N/A 07/27/2016   Procedure: COLONOSCOPY;  Surgeon: Rogene Houston, MD;  Location: [facility];  Service: Endoscopy;  Laterality: N/A;  230  . ESOPHAGOGASTRODUODENOSCOPY N/A 12/08/2016   Procedure: ESOPHAGOGASTRODUODENOSCOPY (EGD);  Surgeon: Rogene Houston, MD;  Location: [facility];  Service: Endoscopy;  Laterality: N/A;  120  . EYE SURGERY     removed cyst from  right eye in june 2015. sept 9th surgery for eye lid drop on both eyes  . JOINT REPLACEMENT    . REPLACEMENT TOTAL KNEE Right 2000  . 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 TUBAL LIGATION       OB History   None      Home Medications    Prior to Admission medications   Medication Sig Start Date End Date Taking? Authorizing Provider  bisoprolol-hydrochlorothiazide (ZIAC) 2.5-6.25 MG tablet TAKE ONE (1) TABLET BY MOUTH EVERY DAY 05/07/18   Kathyrn Drown, MD  diphenhydrAMINE (BENADRYL) 25 MG tablet Take 25 mg by mouth every 6 (six) hours as needed for allergies (swelling).    [provider]  famotidine (PEPCID) 20 MG tablet Take 1 tablet (20 mg total) by mouth 2 (two) times daily. 05/19/18   Noemi Chapel, MD  meclizine (ANTIVERT) 25 MG tablet Take 1 tablet (25 mg total) by mouth 3 (three) times daily as needed for dizziness. 08/20/18   Isla Pence, MD  mupirocin ointment (BACTROBAN) 2 % Apply 1 application topically daily. As needed Patient not taking: Reported on 08/20/2018 05/07/18   Kathyrn Drown, MD  ondansetron (ZOFRAN ODT) 4 MG disintegrating  tablet Take 1 tablet (4 mg total) by mouth every 8 (eight) hours as needed. 08/20/18   Isla Pence, MD  predniSONE (DELTASONE) 20 MG tablet Take 2 tablets (40 mg total) by mouth daily. Patient not taking: Reported on 08/20/2018 05/19/18   Noemi Chapel, MD  Red Yeast Rice Extract (RED YEAST RICE PO) Take by mouth. 2 BID    [provider]  RESTASIS MULTIDOSE 0.05 % ophthalmic emulsion Place 1 drop into both eyes 2 (two) times daily.  01/12/17   [provider]  verapamil (CALAN-SR) 240 MG CR tablet TAKE ONE (1) TABLET BY MOUTH EVERY DAY 05/07/18   Kathyrn Drown, MD    Family History Family History  Problem Relation Age of Onset  . Hypertension Mother   . Coronary artery  disease Mother   . Hypertension Father   . Coronary artery disease Father   . Heart failure Sister   . Hypertension Sister   . Cancer Brother   . Diabetes Brother     Social History Social History   Tobacco Use  . Smoking status: Never Smoker  . Smokeless tobacco: Never Used  Substance Use Topics  . Alcohol use: No  . Drug use: No     Allergies   Other; Penicillins; Salmon [fish allergy]; and Statins   Review of Systems Review of Systems  Gastrointestinal: Positive for nausea and vomiting.  Neurological: Positive for dizziness.  All other systems reviewed and are negative.    Physical Exam Updated Vital Signs BP (!) 188/86 (BP Location: Right Arm)   Pulse (!) 56   Temp 98 F (36.7 C) (Oral)   Resp 20   Ht 5\' 8"  (1.727 m)   Wt 89.4 kg   SpO2 98%   BMI 29.95 kg/m   Physical Exam  Constitutional: She is oriented to person, place, and time. She appears well-developed and well-nourished.  HENT:  Head: Normocephalic and atraumatic.  Right Ear: External ear normal.  Left Ear: External ear normal.  Nose: Nose normal.  Mouth/Throat: Oropharynx is clear and moist.  Eyes: Pupils are equal, round, and reactive to light. Conjunctivae are normal.  Dizziness with EOM  Neck: Normal range of motion. Neck supple.  Cardiovascular: Regular rhythm, normal heart sounds and intact distal pulses. Bradycardia present.  Pulmonary/Chest: Effort normal and breath sounds normal.  Abdominal: Soft. Bowel sounds are normal.  Musculoskeletal: Normal range of motion.  Neurological: She is alert and oriented to person, place, and time.  Skin: Skin is warm. Capillary refill takes less than 2 seconds.  Psychiatric: She has a normal mood and affect. Her behavior is normal. Judgment and thought content normal.  Nursing note and vitals reviewed.    ED Treatments / Results  Labs (all labs ordered are listed, but only abnormal results are displayed) Labs Reviewed  URINALYSIS, ROUTINE W  REFLEX MICROSCOPIC - Abnormal; Notable for the following components:      Result Value   Color, Urine STRAW (*)    Hgb urine dipstick SMALL (*)    All other components within normal limits  COMPREHENSIVE METABOLIC PANEL  CBC WITH DIFFERENTIAL/PLATELET    EKG EKG Interpretation  Date/Time:  Tuesday August 20 2018 17:16:32 EDT Ventricular Rate:  51 PR Interval:    QRS Duration: 95 QT Interval:  494 QTC Calculation: 455 R Axis:   50 Text Interpretation:  Sinus rhythm RSR' in V1 or V2, probably normal variant No significant change since last tracing Confirmed by Isla Pence [id number]) on 08/20/2018  5:26:23 PM   Radiology Mr Virgel Paling ZO Contrast  Result Date: 08/20/2018 CLINICAL DATA:  Altered level of consciousness. Dizziness. Nausea. Unsteady gait. EXAM: MRI HEAD WITHOUT CONTRAST MRA HEAD WITHOUT CONTRAST TECHNIQUE: Multiplanar, multiecho pulse sequences of the brain and surrounding structures were obtained without intravenous contrast. Angiographic images of the head were obtained using MRA technique without contrast. COMPARISON:  None. FINDINGS: MRI HEAD FINDINGS Brain: The diffusion-weighted images demonstrate no acute or subacute infarction. Mild atrophy and white matter changes are within normal limits for age. Dilated perivascular spaces are present in the basal ganglia. T2 changes are present in the thalami bilaterally, left greater than right. White matter changes extend into the brainstem. The internal canals are within normal limits bilaterally. Vascular: Flow is present in the major intracranial arteries. Skull and upper cervical spine: Skull base is within normal limits. Craniocervical junction is normal. Sinuses/Orbits: The paranasal sinuses and mastoid air cells are clear. Globes and orbits are within normal limits. MRA HEAD FINDINGS Atherosclerotic changes are noted within the cavernous internal carotid arteries bilaterally without a significant stenosis. The A1 and M1  segments are normal. MCA bifurcations are intact. There is asymmetric attenuation of distal MCA branch vessels on the left compared to the right. ACA branch vessels are within limits. There is mild narrowing of the right vertebral artery V4 segment. The left vertebral artery is the dominant vessel. There is some irregularity on the left as well without a significant stenosis. The basilar artery is normal. There is moderate attenuation of distal PCA branch vessels. IMPRESSION: 1. No acute intracranial abnormality. 2. 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 Next 3. Anterior circulation territory is within normal limits for age. 4. Mild narrowing the distal right vertebral artery without a significant stenosis. 5. Diffuse small vessel disease on MRA is most evident in the posterior circulation and left greater than right MCA territories. Electronically Signed   By: San Morelle M.D.   On: 08/20/2018 17:07   Mr Brain Wo Contrast  Result Date: 08/20/2018 CLINICAL DATA:  Altered level of consciousness. Dizziness. Nausea. Unsteady gait. EXAM: MRI HEAD WITHOUT CONTRAST MRA HEAD WITHOUT CONTRAST TECHNIQUE: Multiplanar, multiecho pulse sequences of the brain and surrounding structures were obtained without intravenous contrast. Angiographic images of the head were obtained using MRA technique without contrast. COMPARISON:  None. FINDINGS: MRI HEAD FINDINGS Brain: The diffusion-weighted images demonstrate no acute or subacute infarction. Mild atrophy and white matter changes are within normal limits for age. Dilated perivascular spaces are present in the basal ganglia. T2 changes are present in the thalami bilaterally, left greater than right. White matter changes extend into the brainstem. The internal canals are within normal limits bilaterally. Vascular: Flow is present in the major intracranial arteries. Skull and upper cervical spine: Skull base  is within normal limits. Craniocervical junction is normal. Sinuses/Orbits: The paranasal sinuses and mastoid air cells are clear. Globes and orbits are within normal limits. MRA HEAD FINDINGS Atherosclerotic changes are noted within the cavernous internal carotid arteries bilaterally without a significant stenosis. The A1 and M1 segments are normal. MCA bifurcations are intact. There is asymmetric attenuation of distal MCA branch vessels on the left compared to the right. ACA branch vessels are within limits. There is mild narrowing of the right vertebral artery V4 segment. The left vertebral artery is the dominant vessel. There is some irregularity on the left as well without a significant stenosis. The basilar artery is normal. There  is moderate attenuation of distal PCA branch vessels. IMPRESSION: 1. No acute intracranial abnormality. 2. 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 Next 3. Anterior circulation territory is within normal limits for age. 4. Mild narrowing the distal right vertebral artery without a significant stenosis. 5. Diffuse small vessel disease on MRA is most evident in the posterior circulation and left greater than right MCA territories. Electronically Signed   By: San Morelle M.D.   On: 08/20/2018 17:07    Procedures Procedures (including critical care time)  Medications Ordered in ED Medications  sodium chloride 0.9 % bolus 1,000 mL (1,000 mLs Intravenous New Bag/Given 08/20/18 1806)  ondansetron (ZOFRAN) injection 4 mg (4 mg Intravenous Given 08/20/18 1806)  meclizine (ANTIVERT) tablet 25 mg (25 mg Oral Given 08/20/18 1809)     Initial Impression / Assessment and Plan / ED Course  I have reviewed the triage vital signs and the nursing notes.  Pertinent labs & imaging results that were available during my care of the patient were reviewed by me and considered in my medical decision making (see chart  for details).    No evidence of stroke on MRI.  Pt is feeling better after treatment.  She is able to ambulate and does not feel nauseous.  She knows to return if worse.  Final Clinical Impressions(s) / ED Diagnoses   Final diagnoses:  Benign paroxysmal positional vertigo, unspecified laterality    ED Discharge Orders         Ordered    meclizine (ANTIVERT) 25 MG tablet  3 times daily PRN     08/20/18 1942    ondansetron (ZOFRAN ODT) 4 MG disintegrating tablet  Every 8 hours PRN     08/20/18 1942           Isla Pence, MD 08/20/18 1943

## 2018-08-21 NOTE — Telephone Encounter (Signed)
Friday is fine thank you

## 2018-08-21 NOTE — Telephone Encounter (Signed)
Discussed with pt. Pt states she would rather come in on Friday. She does not currently need anything right now. Doing better today. Still has a headache that she states is from sinus because she gets them every fall.

## 2018-08-23 ENCOUNTER — Encounter: Payer: Self-pay | Admitting: Family Medicine

## 2018-08-23 ENCOUNTER — Ambulatory Visit (INDEPENDENT_AMBULATORY_CARE_PROVIDER_SITE_OTHER): Payer: Medicare Other | Admitting: Family Medicine

## 2018-08-23 VITALS — BP 134/82 | Ht 68.0 in | Wt 199.8 lb

## 2018-08-23 DIAGNOSIS — R42 Dizziness and giddiness: Secondary | ICD-10-CM

## 2018-08-23 DIAGNOSIS — Z23 Encounter for immunization: Secondary | ICD-10-CM

## 2018-08-23 MED ORDER — AZITHROMYCIN 250 MG PO TABS: ORAL_TABLET | ORAL | 0 refills | Status: DC

## 2018-08-23 NOTE — Patient Instructions (Signed)
May need to use a cane until feeling back to normal  Should be able to return to activities this Monday and may drive at that time

## 2018-08-23 NOTE — Progress Notes (Signed)
   Subjective:    Patient ID: [name], female    DOB: Dec 27, 1940, 77 y.o.   MRN: [id number]  HPI Pt here today for hospital follow up. Pt states she is feeling better but her head still feels dizzy at times. Pt states she was dehydrated and the ER gave her fluids.  Patient was in the ER Having some intermittent spells of dizziness Not severe Denies any other major issues When she walks she feels a little unsteady but not in a severe way Room feels unsteady no true spinning no vomiting  Review of Systems  Constitutional: Negative for activity change, fatigue and fever.  HENT: Negative for congestion and rhinorrhea.   Respiratory: Negative for cough, chest tightness and shortness of breath.   Cardiovascular: Negative for chest pain and leg swelling.  Gastrointestinal: Negative for abdominal pain and nausea.  Skin: Negative for color change.  Neurological: Positive for dizziness. Negative for headaches.  Psychiatric/Behavioral: Negative for agitation and behavioral problems.       Objective:   Physical Exam  Constitutional: She appears well-developed and well-nourished. No distress.  HENT:  Head: Normocephalic and atraumatic.  Eyes: Right eye exhibits no discharge. Left eye exhibits no discharge.  Neck: No tracheal deviation present.  Cardiovascular: Normal rate, regular rhythm and normal heart sounds.  No murmur heard. Pulmonary/Chest: Effort normal and breath sounds normal. No respiratory distress. She has no wheezes. She has no rales.  Musculoskeletal: She exhibits no edema.  Lymphadenopathy:    She has no cervical adenopathy.  Neurological: She is alert. She exhibits normal muscle tone.  Skin: Skin is warm and dry. No erythema.  Psychiatric: Her behavior is normal.  Vitals reviewed.         Assessment & Plan:  Dizziness Inner ear Blood pressure good Continue current measures Consider using a cane when walking around May resume driving on  Monday Follow-up sooner problems

## 2018-10-09 ENCOUNTER — Ambulatory Visit: Payer: Medicare Other | Admitting: Family Medicine

## 2018-10-13 MED ORDER — SODIUM CHLORIDE 0.9% FLUSH
INTRAVENOUS | Status: AC
  Filled 2018-10-13: qty 20

## 2018-10-14 ENCOUNTER — Ambulatory Visit (INDEPENDENT_AMBULATORY_CARE_PROVIDER_SITE_OTHER): Payer: Medicare Other | Admitting: Family Medicine

## 2018-10-14 VITALS — BP 124/68 | Ht 68.0 in | Wt 197.2 lb

## 2018-10-14 DIAGNOSIS — I10 Essential (primary) hypertension: Secondary | ICD-10-CM | POA: Diagnosis not present

## 2018-10-14 DIAGNOSIS — E785 Hyperlipidemia, unspecified: Secondary | ICD-10-CM | POA: Diagnosis not present

## 2018-10-14 MED ORDER — BISOPROLOL-HYDROCHLOROTHIAZIDE 2.5-6.25 MG PO TABS: ORAL_TABLET | ORAL | 1 refills | Status: DC

## 2018-10-14 MED ORDER — ZOSTER VAC RECOMB ADJUVANTED 50 MCG/0.5ML IM SUSR: 0.5000 mL | Freq: Once | INTRAMUSCULAR | 1 refills | Status: AC

## 2018-10-14 MED ORDER — VERAPAMIL HCL ER 240 MG PO TBCR: EXTENDED_RELEASE_TABLET | ORAL | 1 refills | Status: DC

## 2018-10-14 NOTE — Progress Notes (Signed)
   Subjective:    Patient ID: [name], female    DOB: 07-30-1941, 77 y.o.   MRN: [id number]  Hypertension  This is a chronic problem. Pertinent negatives include no chest pain or shortness of breath. (Pt states her BP has been doing well) There are no compliance problems.   She is taking her blood pressure medicines as directed denies excessive salt use staying physically active denies being depressed but under some stress  Pt here today for follow up. Pt states she has had several deaths in the family and has had lots going on.    Review of Systems  Constitutional: Negative for activity change and appetite change.  HENT: Negative for congestion and rhinorrhea.   Respiratory: Negative for cough and shortness of breath.   Cardiovascular: Negative for chest pain and leg swelling.  Gastrointestinal: Negative for abdominal pain, nausea and vomiting.  Skin: Negative for color change.  Neurological: Negative for dizziness and weakness.  Psychiatric/Behavioral: Negative for agitation and confusion.       Objective:   Physical Exam Vitals signs reviewed.  Constitutional:      General: She is not in acute distress. HENT:     Head: Normocephalic and atraumatic.  Eyes:     General:        Right eye: No discharge.        Left eye: No discharge.  Neck:     Trachea: No tracheal deviation.  Cardiovascular:     Rate and Rhythm: Normal rate and regular rhythm.     Heart sounds: Normal heart sounds. No murmur.  Pulmonary:     Effort: Pulmonary effort is normal. No respiratory distress.     Breath sounds: Normal breath sounds.  Lymphadenopathy:     Cervical: No cervical adenopathy.  Skin:    General: Skin is warm and dry.  Neurological:     Mental Status: She is alert.     Coordination: Coordination normal.  Psychiatric:        Behavior: Behavior normal.      Shin Grix vaccine recommended     Assessment & Plan:  HTN- Patient was seen today as part of a visit regarding  hypertension. The importance of healthy diet and regular physical activity was discussed. The importance of compliance with medications discussed.  Ideal goal is to keep blood pressure low elevated levels certainly below 720/94 when possible.  The patient was counseled that keeping blood pressure under control lessen his risk of complications.  The importance of regular follow-ups was discussed with the patient.  Low-salt diet such as DASH recommended.  Regular physical activity was recommended as well.  Patient was advised to keep regular follow-ups.  Under some stress not depressed currently continue current medication she will notify us if progressive troubles otherwise follow-up in 4 to 6 months lab work at that time

## 2018-10-14 NOTE — Patient Instructions (Signed)

## 2019-02-13 ENCOUNTER — Ambulatory Visit: Payer: Medicare Other | Admitting: Family Medicine

## 2019-03-13 ENCOUNTER — Telehealth: Payer: Self-pay | Admitting: *Deleted

## 2019-03-13 NOTE — Telephone Encounter (Signed)
Patient is wondering what you recommend OTC for leg cramps

## 2019-03-13 NOTE — Telephone Encounter (Signed)
Discussed with pt. Pt verbalized understanding.  °

## 2019-03-13 NOTE — Telephone Encounter (Signed)
Stretching, massaging on a regular basis, also may try mustard-sometimes a teaspoon of mustard can help

## 2019-03-31 ENCOUNTER — Telehealth: Payer: Self-pay | Admitting: Family Medicine

## 2019-03-31 DIAGNOSIS — I10 Essential (primary) hypertension: Secondary | ICD-10-CM

## 2019-03-31 DIAGNOSIS — Z1329 Encounter for screening for other suspected endocrine disorder: Secondary | ICD-10-CM

## 2019-03-31 DIAGNOSIS — E785 Hyperlipidemia, unspecified: Secondary | ICD-10-CM

## 2019-03-31 NOTE — Telephone Encounter (Signed)
Last labs 01/16/18 lipid, liver. Pt wants to have liver and thyroid checked

## 2019-03-31 NOTE — Telephone Encounter (Signed)
Labs orders placed. Tried to contact patient; phone kept ringing. No answering service

## 2019-03-31 NOTE — Telephone Encounter (Signed)
Would like to have labwork done to check Thyroid and Liver functions at [facility].

## 2019-03-31 NOTE — Telephone Encounter (Signed)
Lip liv tsh met7 and  Ov with dr Nicki Reaper if not already sched

## 2019-04-02 NOTE — Telephone Encounter (Signed)
Pt daughter will give patient lab orders.

## 2019-04-02 NOTE — Telephone Encounter (Signed)
Tried to call no answer

## 2019-04-07 DIAGNOSIS — Z1329 Encounter for screening for other suspected endocrine disorder: Secondary | ICD-10-CM | POA: Diagnosis not present

## 2019-04-07 DIAGNOSIS — I10 Essential (primary) hypertension: Secondary | ICD-10-CM | POA: Diagnosis not present

## 2019-04-07 DIAGNOSIS — E785 Hyperlipidemia, unspecified: Secondary | ICD-10-CM | POA: Diagnosis not present

## 2019-04-08 LAB — LIPID PANEL
Chol/HDL Ratio: 4.3 ratio (ref 0.0–4.4)
Cholesterol, Total: 179 mg/dL (ref 100–199)
HDL: 42 mg/dL (ref >39)
LDL Calculated: 116 mg/dL — ABNORMAL HIGH (ref 0–99)
Triglycerides: 106 mg/dL (ref 0–149)
VLDL Cholesterol Cal: 21 mg/dL (ref 5–40)

## 2019-04-08 LAB — BASIC METABOLIC PANEL
BUN/Creatinine Ratio: 15 (ref 12–28)
BUN: 13 mg/dL (ref 8–27)
CO2: 25 mmol/L (ref 20–29)
Calcium: 9.2 mg/dL (ref 8.7–10.3)
Chloride: 104 mmol/L (ref 96–106)
Creatinine, Ser: 0.84 mg/dL (ref 0.57–1.00)
GFR calc Af Amer: 76 mL/min/{1.73_m2} (ref >59)
GFR calc non Af Amer: 66 mL/min/{1.73_m2} (ref >59)
Glucose: 89 mg/dL (ref 65–99)
Potassium: 4.2 mmol/L (ref 3.5–5.2)
Sodium: 142 mmol/L (ref 134–144)

## 2019-04-08 LAB — HEPATIC FUNCTION PANEL
ALT: 10 IU/L (ref 0–32)
AST: 14 IU/L (ref 0–40)
Albumin: 4.2 g/dL (ref 3.7–4.7)
Alkaline Phosphatase: 77 IU/L (ref 39–117)
Bilirubin Total: 0.4 mg/dL (ref 0.0–1.2)
Bilirubin, Direct: 0.09 mg/dL (ref 0.00–0.40)
Total Protein: 6.5 g/dL (ref 6.0–8.5)

## 2019-04-08 LAB — TSH: TSH: 4.02 u[IU]/mL (ref 0.450–4.500)

## 2019-04-10 ENCOUNTER — Encounter: Payer: Self-pay | Admitting: Family Medicine

## 2019-04-15 ENCOUNTER — Other Ambulatory Visit: Payer: Self-pay | Admitting: Family Medicine

## 2019-04-21 ENCOUNTER — Other Ambulatory Visit: Payer: Self-pay

## 2019-04-21 ENCOUNTER — Ambulatory Visit (INDEPENDENT_AMBULATORY_CARE_PROVIDER_SITE_OTHER): Payer: Medicare Other | Admitting: Family Medicine

## 2019-04-21 DIAGNOSIS — E785 Hyperlipidemia, unspecified: Secondary | ICD-10-CM | POA: Diagnosis not present

## 2019-04-21 DIAGNOSIS — I10 Essential (primary) hypertension: Secondary | ICD-10-CM | POA: Diagnosis not present

## 2019-04-21 MED ORDER — VERAPAMIL HCL ER 240 MG PO TBCR: EXTENDED_RELEASE_TABLET | ORAL | 1 refills | Status: DC

## 2019-04-21 MED ORDER — BISOPROLOL-HYDROCHLOROTHIAZIDE 2.5-6.25 MG PO TABS: ORAL_TABLET | ORAL | 1 refills | Status: DC

## 2019-04-21 NOTE — Progress Notes (Signed)
   Subjective:    Patient ID: [name], female    DOB: 21-Feb-1941, 78 y.o.   MRN: [id number] Very nice patient video not possible phone visit only Hypertension This is a chronic problem. The current episode started more than 1 year ago. Pertinent negatives include no chest pain, headaches or shortness of breath. Risk factors for coronary artery disease include post-menopausal state. Treatments tried: ziac, calan. There are no compliance problems.   Patient doing a good job taking her medicine also we did discuss ways of minimizing the risk of coronavirus.  Patient overall seems to understand this  Virtual Visit via Video Note  I connected with [name] on 04/21/19 at  8:30 AM EDT by a video enabled telemedicine application and verified that I am speaking with the correct person using two identifiers.  Location: Patient: home Provider: office   I discussed the limitations of evaluation and management by telemedicine and the availability of in person appointments. The patient expressed understanding and agreed to proceed.  History of Present Illness:    Observations/Objective:   Assessment and Plan:   Follow Up Instructions:    I discussed the assessment and treatment plan with the patient. The patient was provided an opportunity to ask questions and all were answered. The patient agreed with the plan and demonstrated an understanding of the instructions.   The patient was advised to call back or seek an in-person evaluation if the symptoms worsen or if the condition fails to improve as anticipated.  I provided 15 minutes of non-face-to-face time during this encounter.      Review of Systems  Constitutional: Negative for activity change, fatigue and fever.  HENT: Negative for congestion and rhinorrhea.   Respiratory: Negative for cough, chest tightness and shortness of breath.   Cardiovascular: Negative for chest pain and leg swelling.  Gastrointestinal:  Negative for abdominal pain and nausea.  Skin: Negative for color change.  Neurological: Negative for dizziness and headaches.  Psychiatric/Behavioral: Negative for agitation and behavioral problems.       Objective:   Physical Exam  Today's visit was via telephone Physical exam was not possible for this visit       Assessment & Plan:  HTN- Patient was seen today as part of a visit regarding hypertension. The importance of healthy diet and regular physical activity was discussed. The importance of compliance with medications discussed.  Ideal goal is to keep blood pressure low elevated levels certainly below 423/53 when possible.  The patient was counseled that keeping blood pressure under control lessen his risk of complications.  The importance of regular follow-ups was discussed with the patient.  Low-salt diet such as DASH recommended.  Regular physical activity was recommended as well.  Patient was advised to keep regular follow-ups.   Patient doing a great job eating staying healthy.  Recently had her 78th birthday.  She will continue everything as is.  Follow-up in approximately 6 months

## 2019-05-27 ENCOUNTER — Other Ambulatory Visit (HOSPITAL_COMMUNITY): Payer: Self-pay | Admitting: Family Medicine

## 2019-05-27 DIAGNOSIS — Z1231 Encounter for screening mammogram for malignant neoplasm of breast: Secondary | ICD-10-CM

## 2019-06-06 ENCOUNTER — Other Ambulatory Visit: Payer: Self-pay

## 2019-06-06 ENCOUNTER — Ambulatory Visit (HOSPITAL_COMMUNITY)
Admission: RE | Admit: 2019-06-06 | Discharge: 2019-06-06 | Disposition: A | Discharge status: Home / Self Care | Payer: Medicare Other | Source: Ambulatory Visit | Attending: Family Medicine | Admitting: Family Medicine

## 2019-06-06 DIAGNOSIS — Z1231 Encounter for screening mammogram for malignant neoplasm of breast: Secondary | ICD-10-CM | POA: Diagnosis not present

## 2019-06-28 ENCOUNTER — Encounter (HOSPITAL_COMMUNITY): Payer: Self-pay | Admitting: Emergency Medicine

## 2019-06-28 ENCOUNTER — Other Ambulatory Visit: Payer: Self-pay

## 2019-06-28 ENCOUNTER — Emergency Department (HOSPITAL_COMMUNITY)
Admission: EM | Admit: 2019-06-28 | Discharge: 2019-06-28 | Disposition: A | Discharge status: Home / Self Care | Payer: Medicare Other | Attending: Emergency Medicine | Admitting: Emergency Medicine

## 2019-06-28 DIAGNOSIS — T783XXA Angioneurotic edema, initial encounter: Secondary | ICD-10-CM | POA: Diagnosis not present

## 2019-06-28 DIAGNOSIS — Z96653 Presence of artificial knee joint, bilateral: Secondary | ICD-10-CM | POA: Diagnosis not present

## 2019-06-28 DIAGNOSIS — I10 Essential (primary) hypertension: Secondary | ICD-10-CM | POA: Insufficient documentation

## 2019-06-28 MED ORDER — PREDNISONE 20 MG PO TABS: 20.0000 mg | ORAL_TABLET | Freq: Two times a day (BID) | ORAL | 0 refills | Status: DC

## 2019-06-28 MED ORDER — ALBUTEROL SULFATE HFA 108 (90 BASE) MCG/ACT IN AERS
2.0000 | INHALATION_SPRAY | Freq: Once | RESPIRATORY_TRACT | Status: AC
  Administered 2019-06-28: 2 via RESPIRATORY_TRACT
  Filled 2019-06-28: qty 6.7

## 2019-06-28 MED ORDER — FAMOTIDINE 20 MG PO TABS
20.0000 mg | ORAL_TABLET | Freq: Once | ORAL | Status: AC
Start: 2019-06-28 — End: 2019-06-28
  Administered 2019-06-28: 10:00:00 20 mg via ORAL
  Filled 2019-06-28: qty 1

## 2019-06-28 MED ORDER — ALBUTEROL SULFATE (2.5 MG/3ML) 0.083% IN NEBU: 5.0000 mg | INHALATION_SOLUTION | Freq: Once | RESPIRATORY_TRACT | Status: DC

## 2019-06-28 MED ORDER — PREDNISONE 50 MG PO TABS
60.0000 mg | ORAL_TABLET | Freq: Once | ORAL | Status: AC
  Administered 2019-06-28: 10:00:00 60 mg via ORAL
  Filled 2019-06-28: qty 1

## 2019-06-28 NOTE — ED Notes (Signed)
Swelling has decreased, pt states she feels better and is ready to go home, edp notified

## 2019-06-28 NOTE — ED Provider Notes (Signed)
Emergency Department Provider Note   I have reviewed the triage vital signs and the nursing notes.   HISTORY  Chief Complaint Angioedema   HPI [name] is a 78 y.o. female who presents the emergency department today secondary to lip swelling.  Patient states she is angioedema many times in the past and this seems to be currently the same.  He seen allergist twice without resolution.  Patient only new medication is that she took a Tylenol on Wednesday.  She states that she does have seasonal allergies pretty bad and she does have grass allergies and her husband states he cut the grass yesterday and she cut ragweed.  She woke up this morning with swelling of her lower lip and took Benadryl which did not seem to make it any better.  Upper lip became swollen as well.  No tongue swelling.  No difficulty breathing.  No nausea, vomiting, diarrhea, abdominal pain, lightheadedness or syncope.  No rashes.   No other associated or modifying symptoms.    Past Medical History:  Diagnosis Date  . Allergy   . Angioedema   . Arthritis   . GERD (gastroesophageal reflux disease)   . Headache   . Hx of echocardiogram 10/2005   normal  . Hyperlipidemia   . Hypertension   . 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 Melena 12/05/2016  . Family hx of colon cancer 06/08/2016  . OA (osteoarthritis) of knee 12/27/2015  . Screening for osteoporosis 02/25/2014  . 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 Allergic reaction 05/13/2013  . Hyperlipemia 04/24/2013  . Hypertension 04/24/2013    Past Surgical History:  Procedure Laterality Date  . ABDOMINAL HYSTERECTOMY    . CHOLECYSTECTOMY    . COLONOSCOPY  06/15/2011   Procedure: COLONOSCOPY;  Surgeon: Rogene Houston, MD;  Location: [facility];  Service: Endoscopy;  Laterality: N/A;  . COLONOSCOPY N/A 07/27/2016   Procedure:  COLONOSCOPY;  Surgeon: Rogene Houston, MD;  Location: [facility];  Service: Endoscopy;  Laterality: N/A;  230  . ESOPHAGOGASTRODUODENOSCOPY N/A 12/08/2016   Procedure: ESOPHAGOGASTRODUODENOSCOPY (EGD);  Surgeon: Rogene Houston, MD;  Location: [facility];  Service: Endoscopy;  Laterality: N/A;  120  . EYE SURGERY     removed cyst from right eye in june 2015. sept 9th surgery for eye lid drop on both eyes  . JOINT REPLACEMENT    . REPLACEMENT TOTAL KNEE Right 2000  . 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 TUBAL LIGATION      Current Outpatient Rx  . Order #: [id number] Class: Normal  . Order #: [id number] Class: Historical Med  . Order #: [id number] Class: Normal  . Order #: [id number] Class: Normal  . Order #: [id number] Class: Normal  . Order #: [id number] Class: Normal  . Order #: [id number] Class: Historical Med  . Order #: [id number] Class: Historical Med  . Order #: [id number] Class: Normal    Allergies Other, Penicillins, Salmon [fish allergy], and Statins  Family History  Problem Relation Age of Onset  . Hypertension Mother   . Coronary artery disease Mother   . Hypertension Father   . Coronary artery disease Father   . Heart failure Sister   . Hypertension Sister   . Cancer Brother   .  Diabetes Brother     Social History Social History   Tobacco Use  . Smoking status: Never Smoker  . Smokeless tobacco: Never Used  Substance Use Topics  . Alcohol use: No  . Drug use: No    Review of Systems  All other systems negative except as documented in the HPI. All pertinent positives and negatives as reviewed in the HPI. ____________________________________________   PHYSICAL EXAM:  VITAL SIGNS: ED Triage Vitals  Enc Vitals Group     BP 06/28/19 0631 (!) 176/74     Pulse Rate 06/28/19 0631 (!) 55     Resp 06/28/19 0631 14     Temp  06/28/19 0631 98.7 F (37.1 C)     Temp Source 06/28/19 0631 Oral     SpO2 06/28/19 0631 98 %     Weight 06/28/19 0622 192 lb (87.1 kg)     Height 06/28/19 0622 5' 8.5" (1.74 m)     Head Circumference --      Peak Flow --      Pain Score 06/28/19 0622 0     Pain Loc --      Pain Edu? --      Excl. in [place]? --     Constitutional: Alert and oriented. Well appearing and in no acute distress. Eyes: Conjunctivae are normal. PERRL. EOMI. Head: Atraumatic. Nose: No congestion/rhinnorhea. Mouth/Throat: Mucous membranes are moist.  Oropharynx non-erythematous. Significant lip edema. No sub lingual edema. No tongue edema. Neck: No stridor.  No meningeal signs.   Cardiovascular: Normal rate, regular rhythm. Good peripheral circulation. Grossly normal heart sounds.   Respiratory: Normal respiratory effort.  No retractions. Lungs slightly diminished. Gastrointestinal: Soft and nontender. No distention.  Musculoskeletal: No lower extremity tenderness nor edema. No gross deformities of extremities. Neurologic:  Normal speech and language. No gross focal neurologic deficits are appreciated.  Skin:  Skin is warm, dry and intact. No rash noted.   ____________________________________________   INITIAL IMPRESSION / ASSESSMENT AND PLAN / ED COURSE  Angioedema without evidence for anaphylaxis. No obvious medications to cause. Did have exposure to grass/weeds yesterday though as possible cause. Will give meds/albuterol and observe to ensure not worsening.   Care transferred pending observation and reevaluation  Pertinent labs & imaging results that were available during my care of the patient were reviewed by me and considered in my medical decision making (see chart for details).  ____________________________________________  FINAL CLINICAL IMPRESSION(S) / ED DIAGNOSES  Final diagnoses:  None    MEDICATIONS GIVEN DURING THIS VISIT:  Medications  albuterol (PROVENTIL) (2.5 MG/3ML) 0.083%  nebulizer solution 5 mg (has no administration in time range)     NEW OUTPATIENT MEDICATIONS STARTED DURING THIS VISIT:  New Prescriptions   No medications on file    Note:  This note was prepared with assistance of Dragon voice recognition software. Occasional wrong-word or sound-a-like substitutions may have occurred due to the inherent limitations of voice recognition software.   Luciel Brickman, Corene Cornea, MD 06/28/19 [id number]

## 2019-06-28 NOTE — Discharge Instructions (Addendum)
You will need to continue treatment with for the swelling with diphenhydramine 25 mg 4 times a day, and famotidine 20 mg twice a day, for 5 days.  We sent a prescription for prednisone to your pharmacy to start taking this evening.  If you feel like the swelling is worsening, having trouble breathing, or other concerns return here.  Otherwise follow-up with your primary care doctor in a week or so for a checkup.

## 2019-06-28 NOTE — ED Triage Notes (Addendum)
Pt c/o angioedema since 3am. Took 4 benadryl without relief. Pt has swelling of cheeks and both lips. No swelling to tongue or back of throat at this time. Hx of same several times over the years.

## 2019-06-28 NOTE — ED Provider Notes (Signed)
8:15 AM-checkout from Dr. Lowella Dell to evaluate for angioedema, possibly related to environmental exposure.  No concern for intraoral swelling.  Patient treated herself with Benadryl prior to arrival.  Not taking ACE inhibitors.   Patient Vitals for the past 24 hrs:  BP Temp Temp src Pulse Resp SpO2 Height Weight  06/28/19 1200 (!) 146/66 - - (!) 54 14 97 % - -  06/28/19 1100 (!) 165/67 - - (!) 54 16 98 % - -  06/28/19 1030 (!) 167/64 - - (!) 53 13 97 % - -  06/28/19 1000 (!) 150/69 - - (!) 56 14 99 % - -  06/28/19 0930 (!) 154/73 - - (!) 54 14 97 % - -  06/28/19 0900 (!) 152/66 - - 61 16 99 % - -  06/28/19 0830 (!) 157/60 - - (!) 58 14 98 % - -  06/28/19 0800 (!) 176/73 - - (!) 49 14 100 % - -  06/28/19 0757 - - - - - 100 % - -  06/28/19 0730 (!) 170/67 - - (!) 51 13 96 % - -  06/28/19 0700 (!) 157/69 - - (!) 53 12 95 % - -  06/28/19 0631 (!) 176/74 98.7 F (37.1 C) Oral (!) 55 14 98 % - -  06/28/19 0622 - - - - - - 5' 8.5" (1.74 m) 87.1 kg   Clinical Course as of Jun 27 1422  Sat Jun 28, 2019  0840 Patient states she feels the same at this time.  Currently no respiratory distress.  Patient's face is swollen from beneath the nose, to the chin, bilaterally, including primarily lower lip.  No appreciable sub-mandibular swelling.  No lingual or oral angioedema.   [EW]    Clinical Course User Index [EW] Daleen Bo, MD   .Critical Care Performed by: Daleen Bo, MD Authorized by: Daleen Bo, MD   Critical care provider statement:    Critical care time (minutes):  45   Critical care start time:  06/28/2019 8:20 AM   Critical care end time:  06/28/2019 2:24 PM   Critical care time was exclusive of:  Separately billable procedures and treating other patients   Critical care was necessary to treat or prevent imminent or life-threatening deterioration of the following conditions: Angioedema.   Critical care was time spent personally by me on the following activities:  Blood draw for  specimens, development of treatment plan with patient or surrogate, discussions with consultants, evaluation of patient's response to treatment, examination of patient, obtaining history from patient or surrogate, ordering and performing treatments and interventions, ordering and review of laboratory studies, pulse oximetry, re-evaluation of patient's condition, review of old charts and ordering and review of radiographic studies     1:21 PM Reevaluation with update and discussion. After initial assessment and treatment, an updated evaluation reveals she feels like the swelling is better and she is comfortable and ready to go home. Daleen Bo   Medical Decision Making: Facial swelling with lip swelling.  This appears to be angioedema however there may be an additional component of face swelling, making generalized allergic reaction more likely.  Doubt anaphylaxis or impending vascular collapse.  Patient had symptomatic improvement after treatment with antihistamines, albuterol and steroid.  She is stable for discharge.  CRITICAL CARE-yes Performed by: Daleen Bo  Nursing Notes Reviewed/ Care Coordinated Applicable Imaging Reviewed Interpretation of Laboratory Data incorporated into ED treatment  The patient appears reasonably screened and/or stabilized for discharge and I doubt any  other medical condition or other [facility] requiring further screening, evaluation, or treatment in the ED at this time prior to discharge.  Plan: Home Medications-continue usual medicines and add Pepcid and Benadryl.  Prescription for prednisone sent to her pharmacy; Home Treatments-gradually advance activity; return here if the recommended treatment, does not improve the symptoms; Recommended follow up-return here if needed, follow-up with PCP in 1 week, for checkup    Daleen Bo, MD 06/28/19 1424

## 2019-07-08 ENCOUNTER — Other Ambulatory Visit: Payer: Self-pay | Admitting: *Deleted

## 2019-07-08 MED ORDER — BISOPROLOL-HYDROCHLOROTHIAZIDE 2.5-6.25 MG PO TABS: ORAL_TABLET | ORAL | 0 refills | Status: DC

## 2019-07-08 MED ORDER — VERAPAMIL HCL ER 240 MG PO TBCR: EXTENDED_RELEASE_TABLET | ORAL | 0 refills | Status: DC

## 2019-07-24 ENCOUNTER — Telehealth: Payer: Self-pay | Admitting: Family Medicine

## 2019-07-24 MED ORDER — VERAPAMIL HCL ER 240 MG PO TBCR: EXTENDED_RELEASE_TABLET | ORAL | 0 refills | Status: DC

## 2019-07-24 NOTE — Telephone Encounter (Signed)
Prescription sent electronically to pharmacy. Patient notified. 

## 2019-07-24 NOTE — Telephone Encounter (Signed)
Verapamil is on back order with her mail order pharmacy and wanted to get a few called in to [facility].

## 2019-09-03 ENCOUNTER — Other Ambulatory Visit: Payer: Self-pay | Admitting: Family Medicine

## 2019-10-06 ENCOUNTER — Other Ambulatory Visit: Payer: Self-pay

## 2019-10-06 ENCOUNTER — Ambulatory Visit (INDEPENDENT_AMBULATORY_CARE_PROVIDER_SITE_OTHER): Payer: Medicare Other | Admitting: Family Medicine

## 2019-10-06 DIAGNOSIS — E785 Hyperlipidemia, unspecified: Secondary | ICD-10-CM

## 2019-10-06 DIAGNOSIS — I10 Essential (primary) hypertension: Secondary | ICD-10-CM

## 2019-10-06 DIAGNOSIS — Z13820 Encounter for screening for osteoporosis: Secondary | ICD-10-CM | POA: Diagnosis not present

## 2019-10-06 MED ORDER — BISOPROLOL-HYDROCHLOROTHIAZIDE 2.5-6.25 MG PO TABS: ORAL_TABLET | ORAL | 1 refills | Status: DC

## 2019-10-06 MED ORDER — VERAPAMIL HCL ER 240 MG PO TBCR: EXTENDED_RELEASE_TABLET | ORAL | 1 refills | Status: DC

## 2019-10-06 MED ORDER — PREDNISONE 20 MG PO TABS: ORAL_TABLET | ORAL | 0 refills | Status: DC

## 2019-10-06 NOTE — Progress Notes (Signed)
   Subjective:    Patient ID: LAGUANA FERRALL, female    DOB: May 27, 1941, 78 y.o.   MRN: [id number]  Hypertension This is a chronic problem. The current episode started more than 1 year ago. Pertinent negatives include no chest pain or shortness of breath. Risk factors for coronary artery disease include post-menopausal state. Treatments tried: verapamil, ziac. There are no compliance problems.    Patient had a little aching and swollen eye a few days ago but took Benadryl and better. Patient has idiopathic angioedema Virtual Visit via Video Note  I connected with MARQUIS MCCLAMROCK on 10/06/19 at  8:30 AM EST by a video enabled telemedicine application and verified that I am speaking with the correct person using two identifiers.  Location: Patient: home Provider: office   I discussed the limitations of evaluation and management by telemedicine and the availability of in person appointments. The patient expressed understanding and agreed to proceed.  History of Present Illness:    Observations/Objective:   Assessment and Plan:   Follow Up Instructions:    I discussed the assessment and treatment plan with the patient. The patient was provided an opportunity to ask questions and all were answered. The patient agreed with the plan and demonstrated an understanding of the instructions.   The patient was advised to call back or seek an in-person evaluation if the symptoms worsen or if the condition fails to improve as anticipated.  I provided 17 minutes of non-face-to-face time during this encounter.     Review of Systems  Constitutional: Negative for activity change, appetite change and fatigue.  HENT: Negative for congestion and rhinorrhea.   Respiratory: Negative for cough and shortness of breath.   Cardiovascular: Negative for chest pain and leg swelling.  Gastrointestinal: Negative for abdominal pain and diarrhea.  Endocrine: Negative for polydipsia and polyphagia.    Skin: Negative for color change.  Neurological: Negative for dizziness and weakness.  Psychiatric/Behavioral: Negative for behavioral problems and confusion.       Objective:   Physical Exam  Today's visit was via telephone Physical exam was not possible for this visit       Assessment & Plan:  Blood pressure good control current medications watching diet well protecting herself against Covid no need for labs currently

## 2019-12-01 IMAGING — MR MR MRA HEAD W/O CM
10 of 11 series · 35 of 48 positions shown · non-contrast
Comparison: None.

CLINICAL DATA: Altered level of consciousness. Dizziness. Nausea.
Unsteady gait.

EXAM:
MRI HEAD WITHOUT CONTRAST
MRA HEAD WITHOUT CONTRAST
TECHNIQUE: Multiplanar, multiecho pulse sequences of the brain and surrounding
structures were obtained without intravenous contrast. Angiographic
images of the head were obtained using MRA technique without
contrast.

[Series 2: [id number]_sag · sagittal · 5.0mm · 0.41mm/px · 2 of 20 slices shown]
[im 1/20]
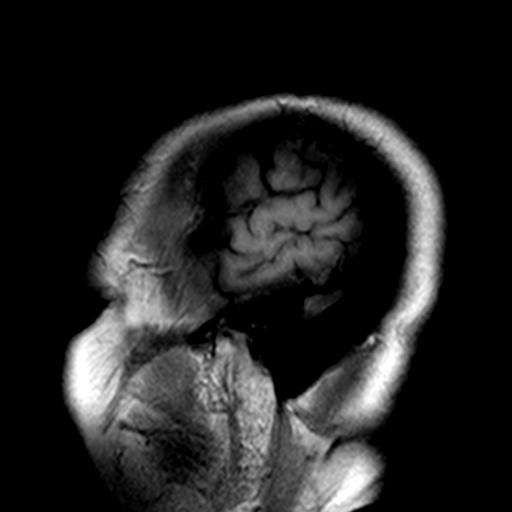
[im 20/20]
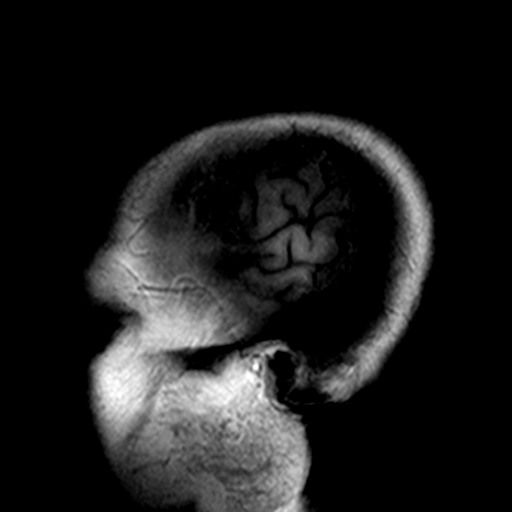

[Series 3: DWI · axial · 3.0mm · 0.76mm/px · z∈[-21,+140]mm · 5 of 55 slices shown (1 of 4)]
[im 1/55]
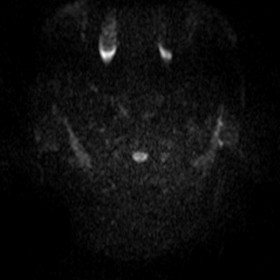
[im 14/55]
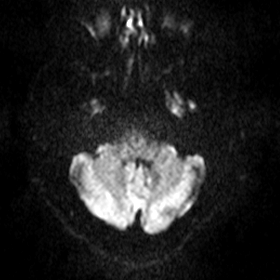
[im 28/55]
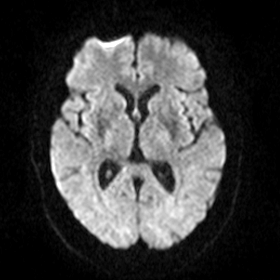
[im 41/55]
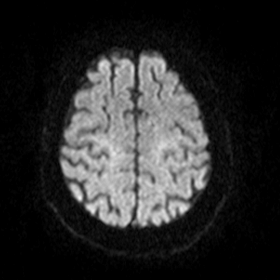
[im 55/55]
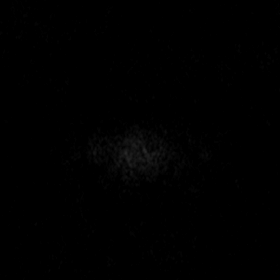

[Series 4: DWI · axial · 3.0mm · 0.74mm/px · z∈[-22,+140]mm · 5 of 55 slices shown (2 of 4)]
[im 1/55]
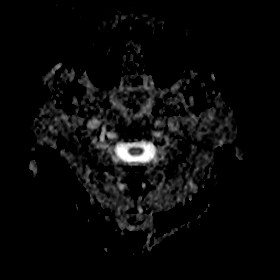
[im 14/55]
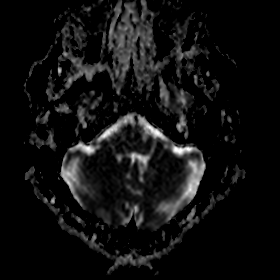
[im 28/55]
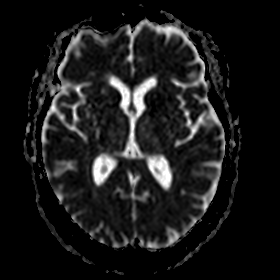
[im 41/55]
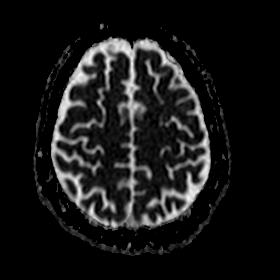
[im 55/55]
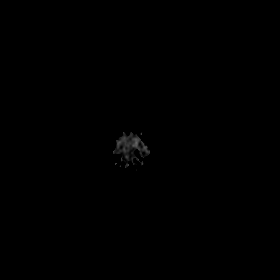

[Series 5: DWI · coronal · 5.0mm · 0.45mm/px · 3 of 35 slices shown (3 of 4)]
[im 1/35]
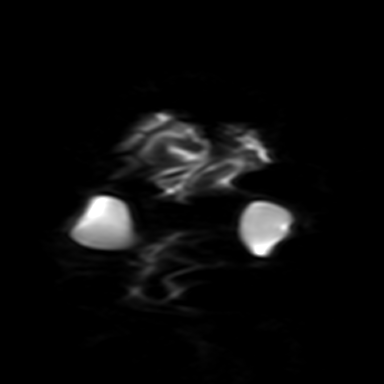
[im 18/35]
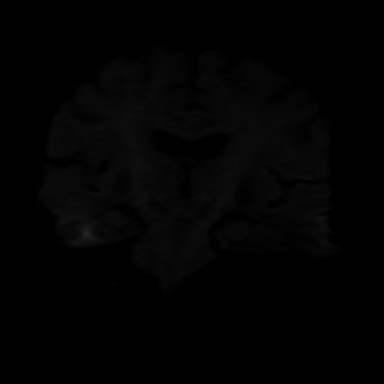
[im 35/35]
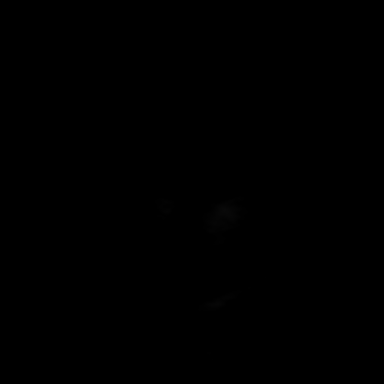

[Series 6: DWI · coronal · 5.0mm · 0.52mm/px · 3 of 34 slices shown (4 of 4)]
[im 1/34]
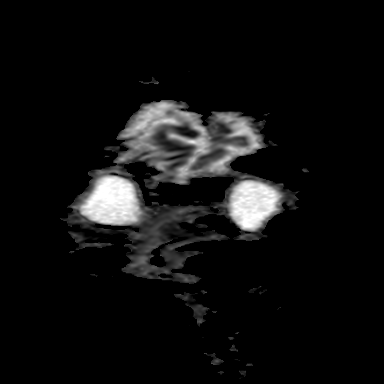
[im 17/34]
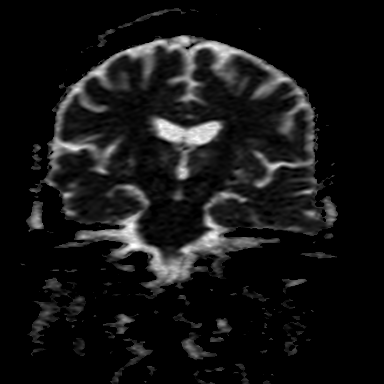
[im 34/34]
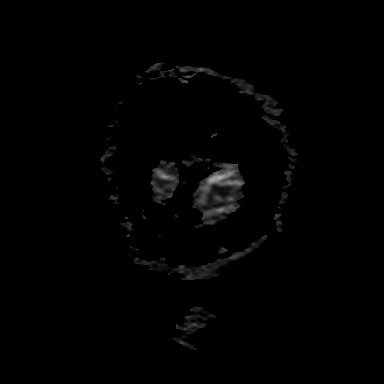

[Series 7: T2 · axial · 5.0mm · 0.68mm/px · z∈[-12,+131]mm · 2 of 23 slices shown (1 of 2)]
[im 1/23]
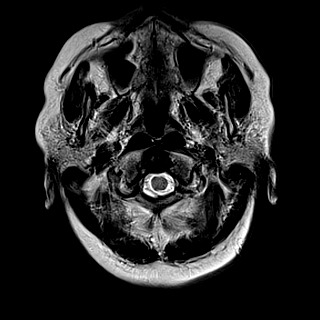
[im 23/23]
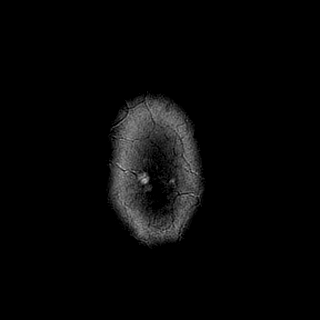

[Series 8: MRA · axial · 0.8mm · 0.33mm/px · 1 of 131 slices shown]
[im 1/131]
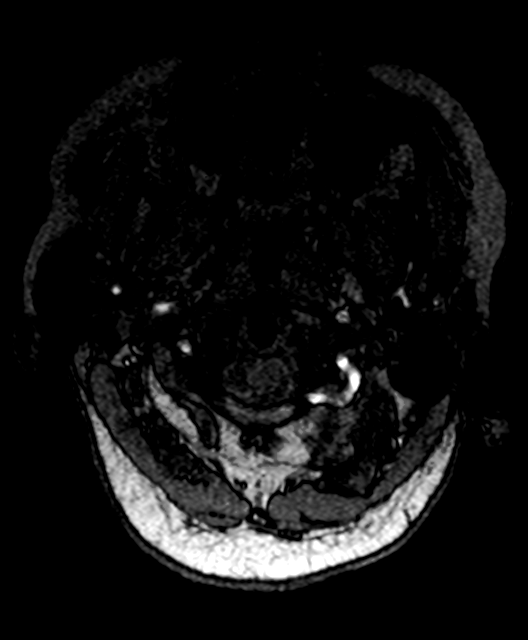

[Series 13: FLAIR · axial · 3.0mm · 0.85mm/px · z∈[-9,+129]mm · 4 of 47 slices shown]
[im 1/47]
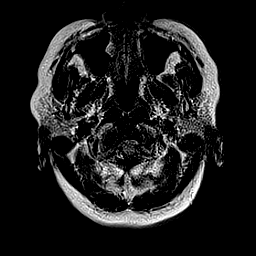
[im 16/47]
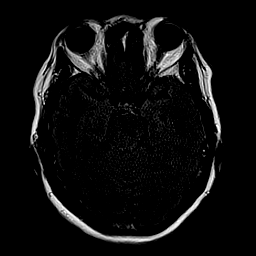
[im 31/47]
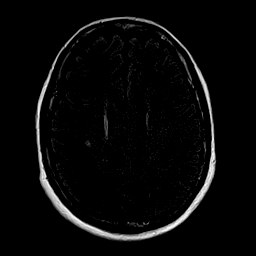
[im 47/47]
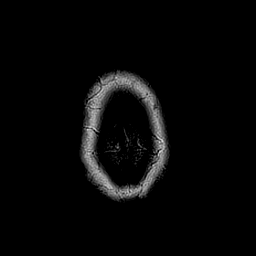

[Series 14: T1 · axial · 2.0mm · 0.42mm/px · z∈[-14,+132]mm · 7 of 74 slices shown]
[im 1/74]
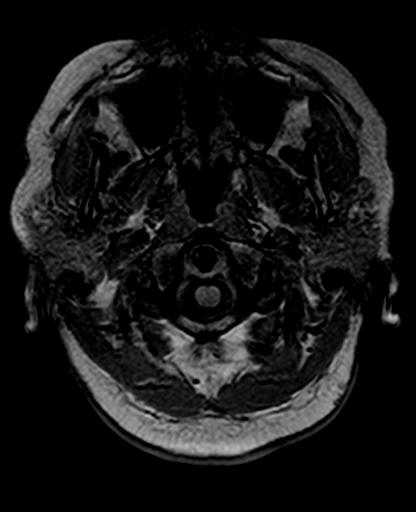
[im 13/74]
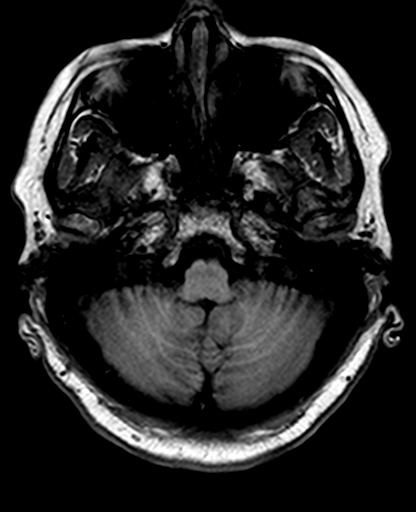
[im 25/74]
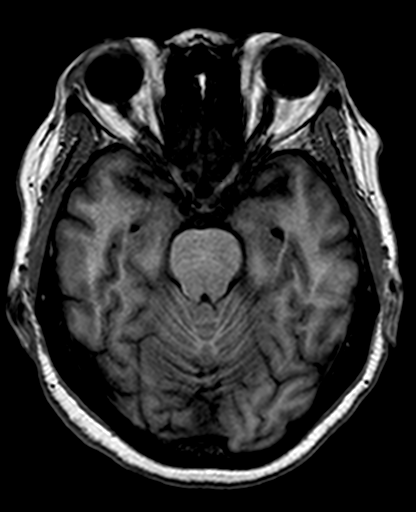
[im 37/74]
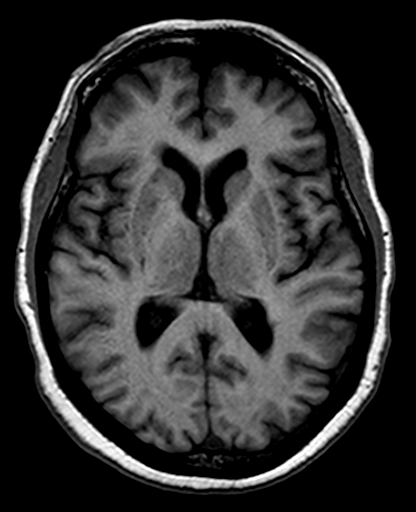
[im 49/74]
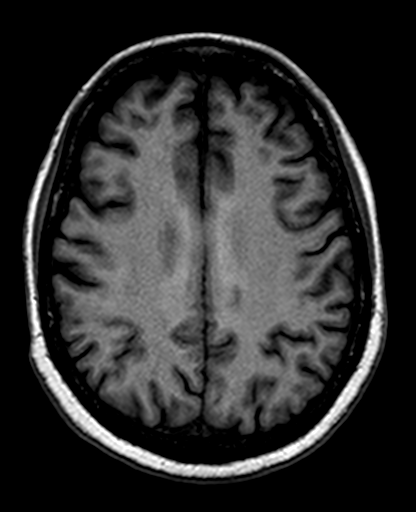
[im 61/74]
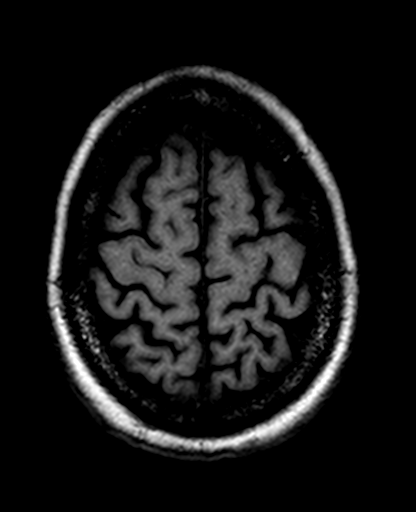
[im 74/74]
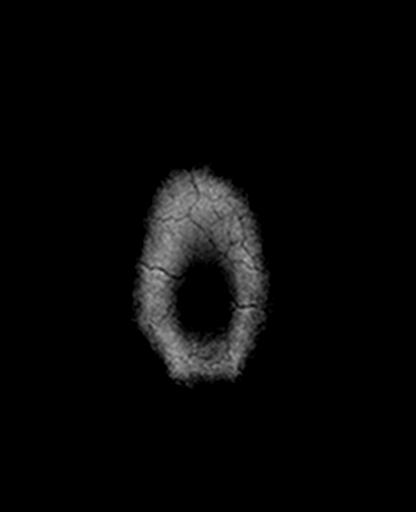

[Series 16: T2 · coronal · 5.0mm · 0.60mm/px · 3 of 28 slices shown (2 of 2)]
[im 1/28]
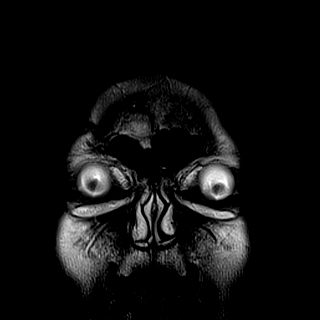
[im 14/28]
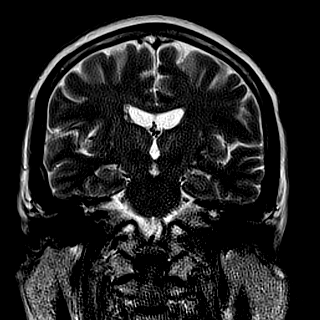
[im 28/28]
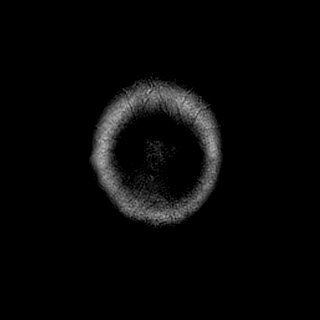

[35 of 48 positions shown; findings below may reference images not displayed]

FINDINGS: MRI HEAD FINDINGS

Brain: The diffusion-weighted images demonstrate no acute or
subacute infarction. Mild atrophy and white matter changes are
within normal limits for age. Dilated perivascular spaces are
present in the basal ganglia. T2 changes are present in the thalami
bilaterally, left greater than right. White matter changes extend
into the brainstem.

The internal canals are within normal limits bilaterally.

Vascular: Flow is present in the major intracranial arteries.

Skull and upper cervical spine: Skull base is within normal limits.
Craniocervical junction is normal.

Sinuses/Orbits: The paranasal sinuses and mastoid air cells are
clear. Globes and orbits are within normal limits.

MRA HEAD FINDINGS

Atherosclerotic changes are noted within the cavernous internal
carotid arteries bilaterally without a significant stenosis. The A1
and M1 segments are normal. MCA bifurcations are intact. There is
asymmetric attenuation of distal MCA branch vessels on the left
compared to the right. ACA branch vessels are within limits.

There is mild narrowing of the right vertebral artery V4 segment.
The left vertebral artery is the dominant vessel. There is some
irregularity on the left as well without a significant stenosis. The
basilar artery is normal. There is moderate attenuation of distal
PCA branch vessels.
IMPRESSION: 1. No acute intracranial abnormality.
2. T2 signal changes in the and brainstem consistent chronic small
vessel disease of posterior circulation. Next
3. Anterior circulation territory is within normal limits for age.
4. Mild narrowing the distal right vertebral artery without a
significant stenosis.
5. Diffuse small vessel disease on MRA is most evident in the
posterior circulation and left greater than right MCA territories.

## 2020-01-09 ENCOUNTER — Telehealth: Payer: Self-pay | Admitting: Family Medicine

## 2020-01-09 NOTE — Telephone Encounter (Signed)
Please advise. Thank you

## 2020-01-09 NOTE — Telephone Encounter (Signed)
Patient would like some Nexium called in for her stomach. (Erica's mom)  [facility]

## 2020-01-10 ENCOUNTER — Other Ambulatory Visit: Payer: Self-pay | Admitting: Nurse Practitioner

## 2020-01-10 ENCOUNTER — Telehealth: Payer: Self-pay | Admitting: Nurse Practitioner

## 2020-01-10 MED ORDER — ESOMEPRAZOLE MAGNESIUM 40 MG PO CPDR: DELAYED_RELEASE_CAPSULE | ORAL | 2 refills | Status: DC

## 2020-01-10 NOTE — Telephone Encounter (Signed)
Phone call to patient. She was notified that Rx was called in. She will contact office in 2 weeks if no improvement. Discussed dietary measures to help with reflux.

## 2020-01-10 NOTE — Telephone Encounter (Signed)
I sent in medication for Ms. [name]. Contact office in 2 weeks if her reflux has not resolved. Thanks.

## 2020-01-12 NOTE — Telephone Encounter (Signed)
Patient notified by Hoyle Sauer- see other message

## 2020-03-05 ENCOUNTER — Telehealth: Payer: Self-pay | Admitting: Family Medicine

## 2020-03-05 NOTE — Telephone Encounter (Signed)
[place] NP with [employer] calling to report hemiglobin A1C 5.7 and they did an ABI test for PVD and her left foot scored a 1.01 and her right foot scored a 0.65.

## 2020-03-12 NOTE — Telephone Encounter (Signed)
Please notify patient It appears that the home service did A1c which looks good but her ABI rates that her circulation in the right leg is considered low.  This would need to be confirmed with a hospital-based test.  I would recommend ABI study of the legs at the hospital.  This would help [place] get a accurate picture of circulation issues in the legs.  Please help set this up

## 2020-03-12 NOTE — Telephone Encounter (Signed)
Contacted patient. Pt unavailable but did speak with [place](on DPR). Informed [name] that provider recommends ABI of leg to get accurate picture of circulation  in legs. [name] states that he will talk to patient and gives [place] a call back. [name] also informed that A1C looks good.

## 2020-03-15 NOTE — Telephone Encounter (Signed)
Discussed with pt. Pt verbalized understanding.  °

## 2020-03-15 NOTE — Telephone Encounter (Signed)
Left message to return call 

## 2020-03-15 NOTE — Telephone Encounter (Signed)
Discussed with pt. Pt states if it is necessary she will do test but she wanted to let dr scott know that she is not having any problems with her legs and the day of the test she had on flip flops and the nurse doing the test said your feet are cold so the test will not be accurate. Pt wants to know does dr Nicki Reaper still recommend the test. If so she will do it.

## 2020-03-15 NOTE — Telephone Encounter (Signed)
She can put this test off  When she comes to see Hoyle Sauer she should discuss this issue and have Hoyle Sauer check her pulses If her pulses in her feet are good there are no need to do the test

## 2020-03-26 ENCOUNTER — Other Ambulatory Visit: Payer: Self-pay | Admitting: Nurse Practitioner

## 2020-03-26 DIAGNOSIS — I10 Essential (primary) hypertension: Secondary | ICD-10-CM | POA: Diagnosis not present

## 2020-03-26 DIAGNOSIS — Z79899 Other long term (current) drug therapy: Secondary | ICD-10-CM | POA: Diagnosis not present

## 2020-03-26 DIAGNOSIS — E785 Hyperlipidemia, unspecified: Secondary | ICD-10-CM

## 2020-03-27 LAB — LIPID PANEL
Chol/HDL Ratio: 4.9 ratio — ABNORMAL HIGH (ref 0.0–4.4)
Cholesterol, Total: 220 mg/dL — ABNORMAL HIGH (ref 100–199)
HDL: 45 mg/dL (ref >39)
LDL Chol Calc (NIH): 155 mg/dL — ABNORMAL HIGH (ref 0–99)
Triglycerides: 112 mg/dL (ref 0–149)
VLDL Cholesterol Cal: 20 mg/dL (ref 5–40)

## 2020-03-27 LAB — HEPATIC FUNCTION PANEL
ALT: 11 IU/L (ref 0–32)
AST: 13 IU/L (ref 0–40)
Albumin: 4.3 g/dL (ref 3.7–4.7)
Alkaline Phosphatase: 80 IU/L (ref 48–121)
Bilirubin Total: 0.4 mg/dL (ref 0.0–1.2)
Bilirubin, Direct: 0.1 mg/dL (ref 0.00–0.40)
Total Protein: 6.7 g/dL (ref 6.0–8.5)

## 2020-03-27 LAB — BASIC METABOLIC PANEL
BUN/Creatinine Ratio: 19 (ref 12–28)
BUN: 17 mg/dL (ref 8–27)
CO2: 23 mmol/L (ref 20–29)
Calcium: 9.5 mg/dL (ref 8.7–10.3)
Chloride: 105 mmol/L (ref 96–106)
Creatinine, Ser: 0.89 mg/dL (ref 0.57–1.00)
GFR calc Af Amer: 71 mL/min/{1.73_m2} (ref >59)
GFR calc non Af Amer: 61 mL/min/{1.73_m2} (ref >59)
Glucose: 90 mg/dL (ref 65–99)
Potassium: 4.4 mmol/L (ref 3.5–5.2)
Sodium: 142 mmol/L (ref 134–144)

## 2020-03-27 LAB — MAGNESIUM: Magnesium: 2.1 mg/dL (ref 1.6–2.3)

## 2020-03-28 ENCOUNTER — Other Ambulatory Visit: Payer: Self-pay | Admitting: Family Medicine

## 2020-03-29 NOTE — Telephone Encounter (Signed)
Htn check up 10/06/19

## 2020-03-31 ENCOUNTER — Encounter: Payer: Self-pay | Admitting: Nurse Practitioner

## 2020-03-31 ENCOUNTER — Ambulatory Visit (INDEPENDENT_AMBULATORY_CARE_PROVIDER_SITE_OTHER): Payer: Medicare Other | Admitting: Nurse Practitioner

## 2020-03-31 ENCOUNTER — Other Ambulatory Visit: Payer: Self-pay

## 2020-03-31 VITALS — BP 132/70 | Temp 97.1°F | Ht 68.0 in | Wt 196.8 lb

## 2020-03-31 DIAGNOSIS — Z01419 Encounter for gynecological examination (general) (routine) without abnormal findings: Secondary | ICD-10-CM | POA: Diagnosis not present

## 2020-03-31 DIAGNOSIS — Z0000 Encounter for general adult medical examination without abnormal findings: Secondary | ICD-10-CM | POA: Diagnosis not present

## 2020-03-31 NOTE — Progress Notes (Signed)
Subjective:    Patient ID: [name], female    DOB: [birth year], 79 y.o.   MRN: [id number]  HPI  Presents today for routine annual visit. Denies acute concerns. Vision and dental exams up to date. States she recently purchased hearing aids and is having a difficult time adjusting to wearing them. Mammogram deferred at this time requesting that she complete every 2 years . Bone density test to be scheduled. Stated that she will follow up with pharmacy related to Shingles vaccination. Denies alcohol and tobacco use. Resides with son. States she had a partial hysterectomy 35-40 years ago related to fibroids. Widowed. No current sexual partner.  Depression screen [facility] 2/9 03/31/2020 05/03/2017 11/15/2015  Decreased Interest 0 0 0  Down, Depressed, Hopeless 0 1 0  PHQ - 2 Score 0 1 0  Altered sleeping - 0 -  Tired, decreased energy - 0 -  Change in appetite - 0 -  Feeling bad or failure about yourself  - 0 -  Trouble concentrating - 0 -  Moving slowly or fidgety/restless - 0 -  Suicidal thoughts - 1 -  PHQ-9 Score - 2 -     Review of Systems  Constitutional: Negative for activity change, appetite change and fatigue.  HENT: Positive for ear pain and hearing loss. Negative for ear discharge, rhinorrhea, sinus pressure and sore throat.        Left ear pain after starting wearing her new hearing aids  Respiratory: Negative for cough, chest tightness, shortness of breath and wheezing.   Cardiovascular: Negative for chest pain, palpitations and leg swelling.  Gastrointestinal: Positive for constipation. Negative for abdominal pain, diarrhea, nausea and vomiting.       Constipation relieved with stool softeners  Genitourinary: Negative for difficulty urinating, dysuria, enuresis, frequency, genital sores, pelvic pain, urgency, vaginal bleeding, vaginal discharge and vaginal pain.  Neurological: Negative for tremors, weakness and headaches.       Objective:   Physical  Exam Constitutional:      Appearance: Normal appearance. She is well-groomed.  HENT:     Right Ear: Ear canal and external ear normal. Tympanic membrane is retracted.     Left Ear: Ear canal and external ear normal. Tympanic membrane is retracted.     Mouth/Throat:     Mouth: Mucous membranes are moist.     Pharynx: No pharyngeal swelling or posterior oropharyngeal erythema.     Tonsils: No tonsillar exudate or tonsillar abscesses.  Neck:     Thyroid: No thyroid mass, thyromegaly or thyroid tenderness.  Cardiovascular:     Rate and Rhythm: Normal rate and regular rhythm.     Heart sounds: Normal heart sounds, S1 normal and S2 normal. No murmur.  Pulmonary:     Effort: Pulmonary effort is normal.     Breath sounds: Normal breath sounds.  Chest:     Comments: Deferred breast exam Abdominal:     General: Abdomen is flat. There is no distension.     Palpations: Abdomen is soft. There is no hepatomegaly or mass.     Tenderness: There is no abdominal tenderness.  Genitourinary:    Comments: Deferred pelvic exam Lymphadenopathy:     Cervical: No cervical adenopathy.     Right cervical: No superficial, deep or posterior cervical adenopathy.    Left cervical: No superficial, deep or posterior cervical adenopathy.  Neurological:     Mental Status: She is alert.  Psychiatric:        Mood and Affect:  Mood normal.        Behavior: Behavior normal.        Thought Content: Thought content normal.        Judgment: Judgment normal.    Results for orders placed or performed in visit on 03/26/20  Lipid panel  Result Value Ref Range   Cholesterol, Total 220 (H) 100 - 199 mg/dL   Triglycerides 112 0 - 149 mg/dL   HDL 45 >39 mg/dL   VLDL Cholesterol Cal 20 5 - 40 mg/dL   LDL Chol Calc (NIH) 155 (H) 0 - 99 mg/dL   Chol/HDL Ratio 4.9 (H) 0.0 - 4.4 ratio  Hepatic function panel  Result Value Ref Range   Total Protein 6.7 6.0 - 8.5 g/dL   Albumin 4.3 3.7 - 4.7 g/dL   Bilirubin Total 0.4 0.0  - 1.2 mg/dL   Bilirubin, Direct 0.10 0.00 - 0.40 mg/dL   Alkaline Phosphatase 80 48 - 121 IU/L   AST 13 0 - 40 IU/L   ALT 11 0 - 32 IU/L  Basic metabolic panel  Result Value Ref Range   Glucose 90 65 - 99 mg/dL   BUN 17 8 - 27 mg/dL   Creatinine, Ser 0.89 0.57 - 1.00 mg/dL   GFR calc non Af Amer 61 >59 mL/min/1.73   GFR calc Af Amer 71 >59 mL/min/1.73   BUN/Creatinine Ratio 19 12 - 28   Sodium 142 134 - 144 mmol/L   Potassium 4.4 3.5 - 5.2 mmol/L   Chloride 105 96 - 106 mmol/L   CO2 23 20 - 29 mmol/L   Calcium 9.5 8.7 - 10.3 mg/dL  Magnesium  Result Value Ref Range   Magnesium 2.1 1.6 - 2.3 mg/dL   Reviewed labs with patient.        Assessment & Plan:  Well woman exam    Labs reviewed and education provided related to lipid levels and concerns related to cardiovascular and heart disease. Discussed options of statin medications as well as lifestyle modification. She decided to attempt diet and exercise plan and re-evaluate levels with 6 month follow up.  Will follow up with scheduling of bone density test   Education provided on importance of calcium supplements as well as osteoporosis prevention. Continue weight bearing exercises.  Advised to start OTC Vit D supplement [phone] units per day  Advised to clean ear canal with 50% hydrogen peroxide and 50% water which has helped left ear canal discomfort.   Follow up in 6 months for hypertension monitoring and labs

## 2020-03-31 NOTE — Progress Notes (Signed)
   Subjective:    Patient ID: MURLE HELLSTROM, female    DOB: 1941/03/08, 79 y.o.   MRN: [id number]  HPI AWV- Annual Wellness Visit  The patient was seen for their annual wellness visit. The patient's past medical history, surgical history, and family history were reviewed. Pertinent vaccines were reviewed ( tetanus, pneumonia, shingles, flu) The patient's medication list was reviewed and updated.  The height and weight were entered.  BMI recorded in electronic record elsewhere  Cognitive screening was completed. Outcome of Mini - Cog: pass   Falls /depression screening electronically recorded within record elsewhere  Current tobacco usage:none (All patients who use tobacco were given written and verbal information on quitting)  Recent listing of emergency department/hospitalizations over the past year were reviewed.  current specialist the patient sees on a regular basis: none   Medicare annual wellness visit patient questionnaire was reviewed.  A written screening schedule for the patient for the next 5-10 years was given. Appropriate discussion of followup regarding next visit was discussed.      Review of Systems     Objective:   Physical Exam        Assessment & Plan:

## 2020-06-18 ENCOUNTER — Ambulatory Visit (INDEPENDENT_AMBULATORY_CARE_PROVIDER_SITE_OTHER): Payer: Medicare Other | Admitting: Family Medicine

## 2020-06-18 DIAGNOSIS — R05 Cough: Secondary | ICD-10-CM

## 2020-06-18 DIAGNOSIS — R059 Cough, unspecified: Secondary | ICD-10-CM

## 2020-06-18 DIAGNOSIS — U071 COVID-19: Secondary | ICD-10-CM | POA: Diagnosis not present

## 2020-06-18 DIAGNOSIS — J019 Acute sinusitis, unspecified: Secondary | ICD-10-CM | POA: Diagnosis not present

## 2020-06-18 MED ORDER — DOXYCYCLINE HYCLATE 100 MG PO TABS
100.0000 mg | ORAL_TABLET | Freq: Two times a day (BID) | ORAL | 0 refills | Status: DC
Start: 2020-06-18 — End: 2020-09-29

## 2020-06-18 NOTE — Progress Notes (Signed)
   Subjective:    Patient ID: [name], female    DOB: Apr 23, 1941, 79 y.o.   MRN: [id number]  HPI patient with head congestion drainage sore throat not feeling good had some body aches and low-grade fever over the weekend no wheezing or difficulty breathing PMH benign energy level subpar patient has been vaccinated has family member tested positive for Covid patient denies any other setbacks  Review of Systems  Constitutional: Negative for activity change and fever.  HENT: Positive for congestion and rhinorrhea. Negative for ear pain.   Eyes: Negative for discharge.  Respiratory: Positive for cough. Negative for shortness of breath and wheezing.   Cardiovascular: Negative for chest pain.       Objective:   Physical Exam Vitals and nursing note reviewed.  Constitutional:      Appearance: She is well-developed.  HENT:     Head: Normocephalic.     Nose: Nose normal.     Mouth/Throat:     Pharynx: No oropharyngeal exudate.  Cardiovascular:     Rate and Rhythm: Normal rate.     Heart sounds: Normal heart sounds. No murmur heard.   Pulmonary:     Effort: Pulmonary effort is normal.     Breath sounds: Normal breath sounds. No wheezing.  Musculoskeletal:     Cervical back: Neck supple.  Lymphadenopathy:     Cervical: No cervical adenopathy.  Skin:    General: Skin is warm and dry.    Initially was going to be a virtual visit but because of the nature of the illness patient was brought in Covid test taken O2 sat 97% patient denies shortness of breath currently     Assessment & Plan:  Possible breakthrough case of Covid recommend Covid testing rest warning signs were discussed in detail antibiotics for possible sinus infection call if any issues

## 2020-06-20 ENCOUNTER — Telehealth: Payer: Self-pay | Admitting: Nurse Practitioner

## 2020-06-20 LAB — SARS-COV-2, NAA 2 DAY TAT

## 2020-06-20 LAB — SPECIMEN STATUS REPORT

## 2020-06-20 LAB — NOVEL CORONAVIRUS, NAA: SARS-CoV-2, NAA: DETECTED — AB

## 2020-06-20 NOTE — Telephone Encounter (Signed)
Called to discuss with [name] about Covid symptoms and the use of casirivimab/imdevimab, a monoclonal antibody infusion for those with mild to moderate Covid symptoms and at a high risk of hospitalization.     Pt is qualified for this infusion at the monoclonal antibody infusion center due to co-morbid conditions and/or a member of an at-risk group, however declines infusion at this time. Symptoms were reviewed as well as criteria for ending isolation.  Symptoms reviewed that would warrant ED/Hospital evaluation. Preventative practices reviewed. Patient verbalized understanding. Patient advised to call back if he decides that he does want to get infusion. Callback number to the infusion center given. Patient advised to go to Urgent care or ED with severe symptoms. Last date pt would be eligible for infusion is 06/26/20 (symptom onset 06/17/20).    Patient Active Problem List   Diagnosis Date Noted  . Melena 12/05/2016  . Family hx of colon cancer 06/08/2016  . OA (osteoarthritis) of knee 12/27/2015  . Screening for osteoporosis 02/25/2014  . 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 Allergic reaction 05/13/2013  . Hyperlipemia 04/24/2013  . Hypertension 04/24/2013    Walden Field, NP

## 2020-06-25 ENCOUNTER — Telehealth: Payer: Self-pay | Admitting: Family Medicine

## 2020-06-25 NOTE — Telephone Encounter (Signed)
Pt is still having head congestion and would like to know if clartin or something could be called in.   [name], [place]

## 2020-06-25 NOTE — Telephone Encounter (Signed)
Pt notified dr Nicki Reaper is gone for the day and she can get enough otc for the weekend and will ask dr Nicki Reaper to send in rx next week when he returns

## 2020-06-27 NOTE — Telephone Encounter (Signed)
So the head congestion that occurs with Covid can last up to a few weeks over-the-counter nasal decongestant spray may be used up to 3 days in a row but not for long-term use otherwise Claritin is okay I do not recommend decongestant tablets because that could cause her blood pressure to go up  Obviously the patient over the next week or 2 starts developing a lot of nasty drainage to let us know

## 2020-06-29 NOTE — Telephone Encounter (Signed)
Pt contacted and verbalized understanding.  

## 2020-07-31 ENCOUNTER — Other Ambulatory Visit (INDEPENDENT_AMBULATORY_CARE_PROVIDER_SITE_OTHER): Payer: Medicare Other

## 2020-07-31 DIAGNOSIS — Z23 Encounter for immunization: Secondary | ICD-10-CM

## 2020-08-30 ENCOUNTER — Telehealth: Payer: Self-pay

## 2020-08-30 NOTE — Telephone Encounter (Signed)
Prednisone 20 mg #18 3qd for 3d then 2qd for 3d then 1qd for 3d Use if necessary

## 2020-08-30 NOTE — Telephone Encounter (Signed)
[name] started swelling yesterday morning- not sure if it is something she ate or not- Patient stated she would like to have prednisone on hand for upcoming trip. Patient states it still feels a little funny but is not swelling anymore  [facility]

## 2020-08-30 NOTE — Telephone Encounter (Signed)
[name] said her mother wanted some prednisone called in for swollen lips from eating something yesterday.  [facility]

## 2020-08-30 NOTE — Telephone Encounter (Signed)
Tc - no answer/vm

## 2020-08-31 ENCOUNTER — Other Ambulatory Visit: Payer: Self-pay | Admitting: *Deleted

## 2020-08-31 MED ORDER — PREDNISONE 20 MG PO TABS
ORAL_TABLET | ORAL | 0 refills | Status: DC
Start: 2020-08-31 — End: 2020-09-29

## 2020-08-31 NOTE — Telephone Encounter (Signed)
Med sent to pharm. Pt notified.  

## 2020-09-29 ENCOUNTER — Ambulatory Visit (INDEPENDENT_AMBULATORY_CARE_PROVIDER_SITE_OTHER): Payer: Medicare Other | Admitting: Family Medicine

## 2020-09-29 ENCOUNTER — Other Ambulatory Visit: Payer: Self-pay

## 2020-09-29 ENCOUNTER — Encounter: Payer: Self-pay | Admitting: Family Medicine

## 2020-09-29 VITALS — BP 138/84 | HR 76 | Temp 97.2°F | Wt 198.6 lb

## 2020-09-29 DIAGNOSIS — M2550 Pain in unspecified joint: Secondary | ICD-10-CM

## 2020-09-29 DIAGNOSIS — E785 Hyperlipidemia, unspecified: Secondary | ICD-10-CM | POA: Diagnosis not present

## 2020-09-29 DIAGNOSIS — R002 Palpitations: Secondary | ICD-10-CM

## 2020-09-29 DIAGNOSIS — N393 Stress incontinence (female) (male): Secondary | ICD-10-CM

## 2020-09-29 DIAGNOSIS — I10 Essential (primary) hypertension: Secondary | ICD-10-CM

## 2020-09-29 NOTE — Progress Notes (Signed)
   Subjective:    Patient ID: [name], female    DOB: 03/17/1941, 79 y.o.   MRN: [id number]  Hypertension This is a chronic problem. Associated symptoms include palpitations. Pertinent negatives include no chest pain, headaches or shortness of breath. (Palpations happen from time to time. ) There are no compliance problems.    Pt also experiencing bladder leakage. Has been going on for about 2 months.  Cough inconmtinence Some urge issues Gets up at night 3 times to pee Light yellow urine No hematuria Wears a pad  Arthritis into the right arm and into the hand Stiffness and soreness Worse at night and into the morning Shoulder as weel Use heating pad and aspercream  Some palpitations Short spells Happens occasionally    Review of Systems  Constitutional: Negative for activity change, fatigue and fever.  HENT: Negative for congestion and rhinorrhea.   Respiratory: Negative for cough, chest tightness and shortness of breath.   Cardiovascular: Positive for palpitations. Negative for chest pain and leg swelling.  Gastrointestinal: Negative for abdominal pain and nausea.  Skin: Negative for color change.  Neurological: Negative for dizziness and headaches.  Psychiatric/Behavioral: Negative for agitation and behavioral problems.       Objective:   Physical Exam Lungs clear heart regular pulse normal extremities no edema skin warm dry  Arthritic changes noted in the right hand most likely consistent with osteoarthritis EKG shows an occasional PAC.  Otherwise normal sinus rhythm no ischemic changes this was compared to October 2019    Assessment & Plan:  1. Primary hypertension Blood pressure good control continue current measures. - Rheumatoid Factor - C-reactive protein - Basic Metabolic Panel (BMET)  2. Hyperlipidemia, unspecified hyperlipidemia type Unable to take statins.  Takes good care at trying to watch her diet closely - Rheumatoid Factor -  C-reactive protein - Basic Metabolic Panel (BMET)  3. Arthralgia, unspecified joint Significant arthralgias this is concerning for the possibility of advancing osteoarthritis of the right hand but I do not feel that the patient is dealing with any type of rheumatoid arthritis at this point. - Rheumatoid Factor - C-reactive protein - Basic Metabolic Panel (BMET)  4. Palpitations It was felt that this was more likely PACs not a sign of any type of serious underlying problem if she starts noticing this happening more frequently or more persistently she will need to have further follow-up possibly cardiology and monitoring - EKG 12-Lead  5. Stress incontinence (female) (female) Kegel exercises were shown in regards to a printout hold off on any medications currently patient has had a hysterectomy  Blood pressure is borderline we will bring her back in 1 month to recheck blood pressure possibly need adjustment of medicines

## 2020-09-29 NOTE — Patient Instructions (Signed)

## 2020-10-06 ENCOUNTER — Encounter: Payer: Self-pay | Admitting: Family Medicine

## 2020-10-29 DIAGNOSIS — E785 Hyperlipidemia, unspecified: Secondary | ICD-10-CM | POA: Diagnosis not present

## 2020-10-29 DIAGNOSIS — I10 Essential (primary) hypertension: Secondary | ICD-10-CM | POA: Diagnosis not present

## 2020-10-29 DIAGNOSIS — M2550 Pain in unspecified joint: Secondary | ICD-10-CM | POA: Diagnosis not present

## 2020-10-30 LAB — BASIC METABOLIC PANEL
BUN/Creatinine Ratio: 20 (ref 12–28)
BUN: 16 mg/dL (ref 8–27)
CO2: 24 mmol/L (ref 20–29)
Calcium: 9.7 mg/dL (ref 8.7–10.3)
Chloride: 104 mmol/L (ref 96–106)
Creatinine, Ser: 0.82 mg/dL (ref 0.57–1.00)
GFR calc Af Amer: 78 mL/min/{1.73_m2} (ref >59)
GFR calc non Af Amer: 67 mL/min/{1.73_m2} (ref >59)
Glucose: 90 mg/dL (ref 65–99)
Potassium: 4.3 mmol/L (ref 3.5–5.2)
Sodium: 142 mmol/L (ref 134–144)

## 2020-10-30 LAB — C-REACTIVE PROTEIN: CRP: 1 mg/L (ref 0–10)

## 2020-10-30 LAB — RHEUMATOID FACTOR: Rheumatoid fact SerPl-aCnc: <10 IU/mL (ref <14.0)

## 2020-11-03 ENCOUNTER — Other Ambulatory Visit: Payer: Self-pay

## 2020-11-03 ENCOUNTER — Telehealth (INDEPENDENT_AMBULATORY_CARE_PROVIDER_SITE_OTHER): Payer: Medicare Other | Admitting: Family Medicine

## 2020-11-03 DIAGNOSIS — I10 Essential (primary) hypertension: Secondary | ICD-10-CM | POA: Diagnosis not present

## 2020-11-03 DIAGNOSIS — R600 Localized edema: Secondary | ICD-10-CM

## 2020-11-03 DIAGNOSIS — T783XXD Angioneurotic edema, subsequent encounter: Secondary | ICD-10-CM

## 2020-11-03 MED ORDER — PREDNISONE 20 MG PO TABS
ORAL_TABLET | ORAL | 0 refills | Status: DC
Start: 2020-11-03 — End: 2020-12-03

## 2020-11-03 NOTE — Progress Notes (Signed)
   Subjective:    Patient ID: [name], female    DOB: 10/14/1941, 80 y.o.   MRN: [id number]  HPIone month recheck on blood pressure. Pt is doing a virtual visit and does not have a bp monitor at home and has not checked bp since last visit.   Would like to get some prednisone. States sometimes her lips and mouth swell. This first started years ago and flared up yesterday. No sob.  Patient does have some intermittent swelling issues initially was thought to be potentially due to medications but now they just occur intermittently without rhyme or reason but no significant shortness of breath with it no hives with it no wheezing or difficulty breathing Virtual Visit via Telephone Note  I connected with [name] on 11/03/20 at 10:00 AM EST by telephone and verified that I am speaking with the correct person using two identifiers.  Location: Patient: home Provider: office   I discussed the limitations, risks, security and privacy concerns of performing an evaluation and management service by telephone and the availability of in person appointments. I also discussed with the patient that there may be a patient responsible charge related to this service. The patient expressed understanding and agreed to proceed.   History of Present Illness:    Observations/Objective:   Assessment and Plan:   Follow Up Instructions:    I discussed the assessment and treatment plan with the patient. The patient was provided an opportunity to ask questions and all were answered. The patient agreed with the pl20 including an and demonstrated an understanding of the instructions.   The patient was advised to call back or seek an in-person evaluation if the symptoms worsen or if the condition fails to improve as anticipated.  I provided 20 including chart review documentation minutes of non-face-to-face time during this encounter.       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

## 2020-12-03 ENCOUNTER — Ambulatory Visit (INDEPENDENT_AMBULATORY_CARE_PROVIDER_SITE_OTHER): Payer: Medicare Other | Admitting: Nurse Practitioner

## 2020-12-03 ENCOUNTER — Encounter: Payer: Self-pay | Admitting: Nurse Practitioner

## 2020-12-03 VITALS — BP 150/72 | HR 64 | Temp 98.8°F | Wt 194.2 lb

## 2020-12-03 DIAGNOSIS — R5383 Other fatigue: Secondary | ICD-10-CM | POA: Diagnosis not present

## 2020-12-03 DIAGNOSIS — A084 Viral intestinal infection, unspecified: Secondary | ICD-10-CM | POA: Diagnosis not present

## 2020-12-03 MED ORDER — ONDANSETRON 8 MG PO TBDP: 8.0000 mg | ORAL_TABLET | Freq: Three times a day (TID) | ORAL | 0 refills | Status: DC | PRN

## 2020-12-03 NOTE — Progress Notes (Signed)
   Subjective:    Patient ID: [name], female    DOB: 03/20/1941, 80 y.o.   MRN: [id number]  HPI Pt started feeling weak this morning, loose stools that turned black this morning, cant eat or drink anything. Diarrhea started Tuesday.   Review of Systems     Objective:   Physical Exam        Assessment & Plan:

## 2020-12-03 NOTE — Patient Instructions (Signed)
Food Choices to Help Relieve Diarrhea, Adult Diarrhea can make you feel weak and cause you to become dehydrated. It is important to choose the right foods and drinks to:  Relieve diarrhea.  Replace lost fluids and nutrients.  Prevent dehydration. What are tips for following this plan? Relieving diarrhea  Avoid foods that make your diarrhea worse. These may include: ? Foods and beverages sweetened with high-fructose corn syrup, honey, or sweeteners such as xylitol, sorbitol, and mannitol. ? Fried, greasy, or spicy foods. ? Raw fruits and vegetables.  Eat foods that are rich in probiotics. These include foods such as yogurt and fermented milk products. Probiotics can help increase healthy bacteria in your stomach and intestines (gastrointestinal tract or GI tract). This may help digestion and stop diarrhea.  If you have lactose intolerance, avoid dairy products. These may make your diarrhea worse.  Take medicine to help stop diarrhea only as told by your health care provider. Replacing nutrients  Eat bland, easy-to-digest foods in small amounts as you are able, until your diarrhea starts to get better. These foods include bananas, applesauce, rice, toast, and crackers.  Gradually reintroduce nutrient-rich foods as tolerated or as told by your health care provider. This includes: ? Well-cooked protein foods, such as eggs, lean meats like fish or chicken without skin, and tofu. ? Peeled, seeded, and soft-cooked fruits and vegetables. ? Low-fat dairy products. ? Whole grains.  Take vitamin and mineral supplements as told by your health care provider.   Preventing dehydration  Start by sipping water or a solution to prevent dehydration (oral rehydration solution, ORS). This is a drink that helps replace fluids and minerals your body has lost. You can buy an ORS at pharmacies and retail stores.  Try to drink at least 8-10 cups (2,000-2,500 mL) of fluid each day to help replace lost  fluids. If you have urine that is pale yellow, you are getting enough fluids.  You may drink other liquids in addition to water, such as fruit juice that you have added water to (diluted fruit juice) or low-calorie sports drinks, as tolerated or as told by your health care provider.  Avoid drinks with caffeine, such as coffee, tea, or soft drinks.  Avoid alcohol.   Summary  When you have diarrhea, it is important to choose the right foods and drinks to relieve diarrhea, to replace lost fluids and nutrients, and to prevent dehydration.  Make sure you drink enough fluid to keep your urine pale yellow.  You may benefit from eating bland foods at first. Gradually reintroduce healthy, nutrient-rich foods as tolerated or as told by your health care provider.  Avoid foods that make your diarrhea worse, such as fried, greasy, or spicy foods. This information is not intended to replace advice given to you by your health care provider. Make sure you discuss any questions you have with your health care provider. Document Revised: 11/25/2019 Document Reviewed: 11/25/2019 Elsevier Patient Education  2021 Elsevier Inc.  

## 2020-12-03 NOTE — Progress Notes (Addendum)
Subjective:    Patient ID: [name], female    DOB: [birth year], 80 y.o.   MRN: [id number]  HPI  Pt presents with diarrhea, nausea, and vomiting. Symptoms started 3 days ago. Was able to go back to work yesterday. Had a hot dog for supper last night which triggered more N/V/D. Took Pepto bismol to relieve the symptoms. Had first bowel movement this AM since taking Pepto bismol. Stool appeared watery and black. Pt did not see any blood in the toilet. Did not vomit up any red or coffee ground emesis. Taking fluids well. Voiding without difficulty, has voided today.  Currently on Nexium for GERD which has been well controlled.    Review of Systems  Constitutional: Positive for appetite change and fatigue. Negative for fever and unexpected weight change.  Respiratory: Negative for chest tightness and shortness of breath.   Cardiovascular: Negative for chest pain and leg swelling.  Gastrointestinal: Positive for diarrhea, nausea and vomiting. Negative for abdominal pain and blood in stool.       Concerned about dark color stool this AM. First stool since taking pepto bismol  Genitourinary: Negative for difficulty urinating.       Describes urine as medium color   No known contacts.      Objective:   Physical Exam Constitutional:      General: She is not in acute distress.    Appearance: She is normal weight. She is ill-appearing.     Comments: Fatigued in appearance. Is able to get on and off exam table with assistance.   HENT:     Mouth/Throat:     Mouth: Mucous membranes are moist.  Cardiovascular:     Rate and Rhythm: Normal rate.     Pulses: Normal pulses.     Heart sounds: Normal heart sounds.     Comments: heart tones S1,S2; 2+ bilateral radial pulses. Pulmonary:     Effort: Pulmonary effort is normal. No respiratory distress.     Breath sounds: Normal breath sounds. No wheezing.  Abdominal:     General: Bowel sounds are normal.     Palpations: There is no mass.      Tenderness: There is no abdominal tenderness. There is no guarding or rebound.     Hernia: No hernia is present.  Musculoskeletal:     Cervical back: Normal range of motion and neck supple.     Right lower leg: No edema.     Left lower leg: No edema.  Skin:    General: Skin is warm and dry.     Comments: Normal skin turgor  Neurological:     Mental Status: She is alert and oriented to person, place, and time.  Psychiatric:        Behavior: Behavior normal.        Thought Content: Thought content normal.        Judgment: Judgment normal.   Orthostatic BP stable. Today's Vitals   12/03/20 1455 12/03/20 1548 12/03/20 1549  BP: (!) 155/69 (!) 152/70 (!) 150/72  Pulse: 64    Temp: 98.8 F (37.1 C)    SpO2: 95%    Weight: 194 lb 3.2 oz (88.1 kg)     Body mass index is 29.53 kg/m.  Results for orders placed or performed in visit on 12/03/20  POCT hemoglobin  Result Value Ref Range   Hemoglobin 12.0 11 - 14.6 g/dL       Assessment & Plan:  Viral gastroenteritis  Other fatigue -  Plan: POCT hemoglobin  Meds ordered this encounter  Medications  . ondansetron (ZOFRAN-ODT) 8 MG disintegrating tablet    Sig: Take 1 tablet (8 mg total) by mouth every 8 (eight) hours as needed for nausea or vomiting.    Dispense:  30 tablet    Refill:  0    Order Specific Question:   Supervising Provider    Answer:   Sallee Lange A [[id number]]    Plan:  Education about the importance of hydration and bland diet.   Educated on the side effects of Pepto bismol being the most likely cause of dark colored stool.  Zofran order for N&V Warning signs reviewed. Go to ED if worse this weekend.  Call back in 72 hours if no improvement.

## 2020-12-06 LAB — POCT HEMOGLOBIN: Hemoglobin: 12 g/dL (ref 11–14.6)

## 2020-12-08 ENCOUNTER — Ambulatory Visit: Payer: Medicare Other | Admitting: Family Medicine

## 2021-02-10 ENCOUNTER — Other Ambulatory Visit: Payer: Self-pay | Admitting: Family Medicine

## 2021-03-22 ENCOUNTER — Telehealth: Payer: Self-pay | Admitting: Family Medicine

## 2021-03-22 NOTE — Telephone Encounter (Signed)
Left message for patient to call back and schedule Medicare Annual Wellness Visit (AWV) in office.   If not able to come in office, please offer to do virtually or by telephone.   Due for AWVI  Please schedule at anytime with Nurse Health Advisor.   

## 2021-03-28 ENCOUNTER — Telehealth: Payer: Self-pay

## 2021-03-28 DIAGNOSIS — I10 Essential (primary) hypertension: Secondary | ICD-10-CM

## 2021-03-28 DIAGNOSIS — E785 Hyperlipidemia, unspecified: Secondary | ICD-10-CM

## 2021-03-28 NOTE — Telephone Encounter (Signed)
[name] wants blood work ordered before her appt   Pt call back [phone]

## 2021-03-28 NOTE — Telephone Encounter (Signed)
Last labs completed 10/2020; BMET, CRP and Rheumatoid Factor. Please advise. Thank you

## 2021-03-28 NOTE — Telephone Encounter (Signed)
I recommend lipid and CMP due to hyperlipidemia hypertension

## 2021-03-29 NOTE — Telephone Encounter (Signed)
Lab orders placed and left message to return call

## 2021-03-30 NOTE — Telephone Encounter (Signed)
Pt returned call and verbalized understanding  

## 2021-03-30 NOTE — Telephone Encounter (Signed)
Left message to return call 

## 2021-04-01 DIAGNOSIS — I10 Essential (primary) hypertension: Secondary | ICD-10-CM | POA: Diagnosis not present

## 2021-04-01 DIAGNOSIS — E785 Hyperlipidemia, unspecified: Secondary | ICD-10-CM | POA: Diagnosis not present

## 2021-04-02 LAB — COMPREHENSIVE METABOLIC PANEL
ALT: 12 IU/L (ref 0–32)
AST: 15 IU/L (ref 0–40)
Albumin/Globulin Ratio: 2 (ref 1.2–2.2)
Albumin: 4.5 g/dL (ref 3.6–4.6)
Alkaline Phosphatase: 89 IU/L (ref 44–121)
BUN/Creatinine Ratio: 20 (ref 12–28)
BUN: 15 mg/dL (ref 8–27)
Bilirubin Total: 0.4 mg/dL (ref 0.0–1.2)
CO2: 22 mmol/L (ref 20–29)
Calcium: 9.2 mg/dL (ref 8.7–10.3)
Chloride: 104 mmol/L (ref 96–106)
Creatinine, Ser: 0.76 mg/dL (ref 0.57–1.00)
Globulin, Total: 2.3 g/dL (ref 1.5–4.5)
Glucose: 97 mg/dL (ref 65–99)
Potassium: 4.1 mmol/L (ref 3.5–5.2)
Sodium: 140 mmol/L (ref 134–144)
Total Protein: 6.8 g/dL (ref 6.0–8.5)
eGFR: 79 mL/min/{1.73_m2} (ref >59)

## 2021-04-02 LAB — LIPID PANEL
Chol/HDL Ratio: 6.1 ratio — ABNORMAL HIGH (ref 0.0–4.4)
Cholesterol, Total: 208 mg/dL — ABNORMAL HIGH (ref 100–199)
HDL: 34 mg/dL — ABNORMAL LOW (ref >39)
LDL Chol Calc (NIH): 150 mg/dL — ABNORMAL HIGH (ref 0–99)
Triglycerides: 131 mg/dL (ref 0–149)
VLDL Cholesterol Cal: 24 mg/dL (ref 5–40)

## 2021-04-20 ENCOUNTER — Other Ambulatory Visit: Payer: Self-pay

## 2021-04-20 ENCOUNTER — Encounter: Payer: Self-pay | Admitting: Family Medicine

## 2021-04-20 ENCOUNTER — Ambulatory Visit (INDEPENDENT_AMBULATORY_CARE_PROVIDER_SITE_OTHER): Payer: Medicare Other | Admitting: Family Medicine

## 2021-04-20 VITALS — BP 138/66 | HR 60 | Temp 97.7°F | Ht 68.0 in | Wt 197.0 lb

## 2021-04-20 DIAGNOSIS — W19XXXA Unspecified fall, initial encounter: Secondary | ICD-10-CM | POA: Diagnosis not present

## 2021-04-20 DIAGNOSIS — R002 Palpitations: Secondary | ICD-10-CM | POA: Diagnosis not present

## 2021-04-20 DIAGNOSIS — I10 Essential (primary) hypertension: Secondary | ICD-10-CM

## 2021-04-20 MED ORDER — VERAPAMIL HCL ER 240 MG PO TBCR: 240.0000 mg | EXTENDED_RELEASE_TABLET | Freq: Every day | ORAL | 1 refills | Status: DC

## 2021-04-20 MED ORDER — BISOPROLOL-HYDROCHLOROTHIAZIDE 2.5-6.25 MG PO TABS: 1.0000 | ORAL_TABLET | Freq: Every day | ORAL | 1 refills | Status: DC

## 2021-04-20 NOTE — Progress Notes (Signed)
   Subjective:    Patient ID: [name], female    DOB: 12-Oct-1941, 80 y.o.   MRN: [id number]  HPI Primary hypertension  Palpitations  Fall, initial encounter Patient gets intermittent palpitations will last a few seconds at a time maybe once a day maybe skips a few days does not cause any chest pain or shortness of breath  Patient had a fall several days ago cut her forehead that is now healed up she denied loss of consciousness she more stumbled she states she was going too fast would occur  She does have blood pressure issues she takes medication she tries watch her diet  Weight mildly up but she does try to watch her diet   Review of Systems     Objective:   Physical Exam General-in no acute distress Eyes-no discharge Lungs-respiratory rate normal, CTA CV-no murmurs,RRR Extremities skin warm dry no edema Neuro grossly normal Behavior normal, alert  Healing laceration on the face noted      Assessment & Plan:  Recent lab work does show elevation of lipids.  Patient does not tolerate statins.  Healthy diet regular activity recommended  Recent fall I do not feel the patient sustained a concussion her laceration on her face is healing fall prevention was discussed-I do not feel she needs a CT scan.  Blood pressure good control currently continue current medications recent lab work shows normal liver and kidney function  Follow-up in 6 months

## 2021-04-28 ENCOUNTER — Other Ambulatory Visit: Payer: Self-pay | Admitting: *Deleted

## 2021-04-28 MED ORDER — VERAPAMIL HCL ER 240 MG PO TBCR: 240.0000 mg | EXTENDED_RELEASE_TABLET | Freq: Every day | ORAL | 1 refills | Status: DC

## 2021-04-28 MED ORDER — BISOPROLOL-HYDROCHLOROTHIAZIDE 2.5-6.25 MG PO TABS: 1.0000 | ORAL_TABLET | Freq: Every day | ORAL | 1 refills | Status: DC

## 2021-06-10 ENCOUNTER — Telehealth: Payer: Self-pay | Admitting: *Deleted

## 2021-06-10 MED ORDER — ESOMEPRAZOLE MAGNESIUM 40 MG PO CPDR: DELAYED_RELEASE_CAPSULE | ORAL | 2 refills | Status: DC

## 2021-06-10 NOTE — Telephone Encounter (Signed)
I assume she needs refills of her Nexium May have refills on her Nexium  Also try to avoid tomato based products caffeine's and chocolates all of these can trigger more reflux related issues Obviously if having any dysphagia significant troubles with reflux or worsening trouble I would recommend a office visit

## 2021-06-10 NOTE — Telephone Encounter (Signed)
Patient calling requesting a prescription for reflux  [facility]

## 2021-06-10 NOTE — Telephone Encounter (Signed)
Prescriptions sent electronically to pharmacy. Patient notified and advised per Dr Nicki Reaper: Also try to avoid tomato based products caffeine's and chocolates all of these can trigger more reflux related issues Obviously if having any dysphagia significant troubles with reflux or worsening trouble Dr Nicki Reaper would recommend a office visit  Patient verbalized understanding and stated she doesn't have reflux very often but did have it the other day so wanted to get something to have.

## 2021-06-10 NOTE — Chronic Care Management (AMB) (Signed)
  Chronic Care Management   Note  06/10/2021 Name: [name] MRN: [id number] DOB: 02-09-1941  [name] is a 80 y.o. year old female who is a primary care patient of Luking, Elayne Snare, MD. I reached out to [name] by phone today in response to a referral sent by [name] PCP, Dr. Wolfgang Phoenix.      [name] was given information about Chronic Care Management services today including:  CCM service includes personalized support from designated clinical staff supervised by her physician, including individualized plan of care and coordination with other care providers 24/7 contact phone numbers for assistance for urgent and routine care needs. Service will only be billed when office clinical staff spend 20 minutes or more in a month to coordinate care. Only one practitioner may furnish and bill the service in a calendar month. The patient may stop CCM services at any time (effective at the end of the month) by phone call to the office staff. The patient will be responsible for cost sharing (co-pay) of up to 20% of the service fee (after annual deductible is met).  Patient agreed to services and verbal consent obtained.   Follow up plan: Telephone appointment with care management team member scheduled for:06/29/21  [facility]  Direct Dial: [phone]

## 2021-06-29 ENCOUNTER — Telehealth: Payer: Self-pay | Admitting: *Deleted

## 2021-06-29 ENCOUNTER — Ambulatory Visit (INDEPENDENT_AMBULATORY_CARE_PROVIDER_SITE_OTHER): Payer: Medicare Other | Admitting: *Deleted

## 2021-06-29 DIAGNOSIS — E785 Hyperlipidemia, unspecified: Secondary | ICD-10-CM

## 2021-06-29 DIAGNOSIS — I10 Essential (primary) hypertension: Secondary | ICD-10-CM

## 2021-06-29 NOTE — Telephone Encounter (Signed)
  Care Management   Follow Up Note   06/29/2021 Name: [name] MRN: [id number] DOB: 07/08/1941   Referred by: Kathyrn Drown, MD Reason for referral : Chronic Care Management (HTN, HLD)   An unsuccessful telephone outreach was attempted today. The patient was referred to the case management team for assistance with care management and care coordination.   Follow Up Plan: Telephone follow up appointment with care management team member scheduled for:  upon care guide rescheduling.  Jacqlyn Larsen [facility], BSN RN Case Manager [facility] [phone]

## 2021-06-29 NOTE — Chronic Care Management (AMB) (Signed)
Chronic Care Management   CCM RN Visit Note  06/29/2021 Name: [name] MRN: [id number] DOB: Mar 04, 1941  Subjective: [name] is a 80 y.o. year old female who is a primary care patient of Luking, Elayne Snare, MD. The care management team was consulted for assistance with disease management and care coordination needs.    Engaged with patient by telephone for initial visit in response to provider referral for case management and/or care coordination services.   Consent to Services:  The patient was given the following information about Chronic Care Management services today, agreed to services, and gave verbal consent: 1. CCM service includes personalized support from designated clinical staff supervised by the primary care provider, including individualized plan of care and coordination with other care providers 2. 24/7 contact phone numbers for assistance for urgent and routine care needs. 3. Service will only be billed when office clinical staff spend 20 minutes or more in a month to coordinate care. 4. Only one practitioner may furnish and bill the service in a calendar month. 5.The patient may stop CCM services at any time (effective at the end of the month) by phone call to the office staff. 6. The patient will be responsible for cost sharing (co-pay) of up to 20% of the service fee (after annual deductible is met). Patient agreed to services and consent obtained.  Patient agreed to services and verbal consent obtained.   Assessment: Review of patient past medical history, allergies, medications, health status, including review of consultants reports, laboratory and other test data, was performed as part of comprehensive evaluation and provision of chronic care management services.   SDOH (Social Determinants of Health) assessments and interventions performed:  SDOH Interventions    Flowsheet Row Most Recent Value  SDOH Interventions   Food Insecurity Interventions  Intervention Not Indicated  Transportation Interventions Intervention Not Indicated        CCM Care Plan  Allergies  Allergen Reactions   Other     Peanut butter and Wheat Oats- causes tongue to swell    Penicillins Hives    Has patient had a PCN reaction causing immediate rash, facial/tongue/throat swelling, SOB or lightheadedness with hypotension: delayed reaction Has patient had a PCN reaction causing severe rash involving mucus membranes or skin necrosis: no Has patient had a PCN reaction that required hospitalization no Has patient had a PCN reaction occurring within the last 10 years: no If all of the above answers are "NO", then may proceed with Cephalosporin use.   Salmon [Fish Allergy] Other (See Comments)    Lips swelling   Statins     Body aches, myalgias    Outpatient Encounter Medications as of 06/29/2021  Medication Sig   bisoprolol-hydrochlorothiazide (ZIAC) 2.5-6.25 MG tablet Take 1 tablet by mouth daily.   diphenhydrAMINE (BENADRYL) 25 MG tablet Take 25 mg by mouth every 6 (six) hours as needed for allergies (swelling).   esomeprazole (NEXIUM) 40 MG capsule Take one capsule po qd prn acid reflux   RESTASIS MULTIDOSE 0.05 % ophthalmic emulsion Place 1 drop into both eyes 2 (two) times daily.    verapamil (CALAN-SR) 240 MG CR tablet Take 1 tablet (240 mg total) by mouth daily.   ondansetron (ZOFRAN-ODT) 8 MG disintegrating tablet Take 1 tablet (8 mg total) by mouth every 8 (eight) hours as needed for nausea or vomiting. (Patient not taking: Reported on 06/29/2021)   No facility-administered encounter medications on file as of 06/29/2021.    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   Allergic reaction 05/13/2013   Hyperlipemia 04/24/2013    Hypertension 04/24/2013    Conditions to be addressed/monitored:HTN and HLD  Care Plan : Hyperlipidemia  Updates made by Kassie Mends, RN since 06/29/2021 12:00 AM     Problem: Health Promotion or Disease Self-Management (Hyperlipidemia)   Priority: Medium     Long-Range Goal: Self-Management Plan Developed for hyperlipidemia   Start Date: 06/29/2021  Expected End Date: 12/27/2021  This Visit's Progress: On track  Priority: Medium  Note:   Current Barriers:  Poorly controlled hyperlipidemia, complicated by diet, does not follow a special diet, no formal exercise plan, works in garden and flowers, delivers meals on wheels Current antihyperlipidemic regimen: unable to tolerate statins, pt reports take red yeast rice Most recent lipid panel:     Component Value Date/Time   CHOL 208 (H) 04/01/2021 0929   TRIG 131 04/01/2021 0929   HDL 34 (L) 04/01/2021 0929   CHOLHDL 6.1 (H) 04/01/2021 0929   CHOLHDL 4.0 01/29/2014 0830   VLDL 16 01/29/2014 0830   LDLCALC 150 (H) 04/01/2021 0929  Patient reports her son lives with her, she is independent in all aspects of her care, still drives, works 3 days per week, remains active at home and in community. ASCVD risk enhancing conditions: age >16, HTN Does not adhere to provider recommendations re:  RN Care Manager Clinical Goal(s):  patient will work with RN Care Manager, providers, and care team towards execution of optimized self-health management plan patient will take all medications exactly as prescribed and will call provider for medication related questions patient will attend all scheduled medical appointments: Dr. Wolfgang Phoenix 10/19/21 Interventions: Collaboration with Kathyrn Drown, MD regarding development and update of comprehensive plan of care as evidenced by provider attestation and co-signature Inter-disciplinary care team collaboration (see longitudinal plan of care) Medication review performed; medication list updated in electronic  medical record.  Inter-disciplinary care team collaboration (see longitudinal plan of care) Provided education to patient re: hyperlipidemia and heart healthy diet Reviewed medications with patient and discussed importance of compliance Discussed plans with patient for ongoing care management follow up and provided patient with direct contact information for care management team Mailed education to pt- low sodium diet Patient Goals/Self-Care Activities: - call for medicine refill 2 or 3 days before it runs out - keep a list of all the medicines I take; vitamins and herbals too - change to whole grain breads, cereal, pasta - drink 6 to 8 glasses of water each day - eat 3 to 5 servings of fruits and vegetables each day - fill half the plate with nonstarchy vegetables - limit fast food meals to no more than 1 per week - manage portion size - read food labels for fat, fiber, carbohydrates and portion size do- discuss my treatment options with the doctor or nurse - spend time outdoors at least 3 times a week  - avoid frying your food- bake or broil instead - follow heart healthy diet - look over education mailed to you- heart healthy diet - take medications as prescribed - try to exercise daily Follow Up Plan: Telephone follow up appointment with care management team member scheduled for:  08/24/2021      Care Plan : Hypertension (Adult)  Updates made by Kassie Mends, RN since 06/29/2021 12:00 AM     Problem: Hypertension (Hypertension)   Priority: Medium     Long-Range Goal: Hypertension Monitored   Start Date: 06/29/2021  Expected End Date: 12/27/2021  This Visit's Progress: On track  Priority: Medium  Note:   Objective:  Last practice recorded BP readings:  BP Readings from Last 3 Encounters:  04/20/21 138/66  12/03/20 (!) 150/72  09/29/20 138/84   Most recent eGFR/CrCl:  Lab Results  Component Value Date   EGFR 79 04/01/2021    No components found for: CRCL Current  Barriers:  Knowledge Deficits related to basic understanding of hypertension pathophysiology and self care management- pt does not check blood pressure regularly, checks maybe once weekly Does not adhere to provider recommendations re: monitoring blood pressure, does not adhere to a special diet Case Manager Clinical Goal(s):  patient will verbalize understanding of plan for hypertension management patient will attend all scheduled medical appointments patient will demonstrate improved adherence to prescribed treatment plan for hypertension as evidenced by taking all medications as prescribed, monitoring and recording blood pressure as directed, adhering to low sodium/DASH diet Interventions:  Collaboration with Kathyrn Drown, MD regarding development and update of comprehensive plan of care as evidenced by provider attestation and co-signature Inter-disciplinary care team collaboration (see longitudinal plan of care) Evaluation of current treatment plan related to hypertension self management and patient's adherence to plan as established by provider. Provided education to patient re: stroke prevention, s/s of heart attack and stroke, DASH diet, complications of uncontrolled blood pressure Mailed education to patient- low sodium diet Reviewed medications with patient and discussed importance of compliance Discussed plans with patient for ongoing care management follow up and provided patient with direct contact information for care management team Advised patient, providing education and rationale, to monitor blood pressure 3 x per week and record, calling PCP for findings outside established parameters.  Reviewed scheduled/upcoming provider appointments including: primary care provider 10/19/21 Reviewed importance of exercising Self-Care Activities:  Attends all scheduled provider appointments Calls provider office for new concerns, questions, or BP outside discussed parameters Checks BP and  records as discussed Follows a low sodium diet/DASH diet Patient Goals: - check blood pressure 3 times per week - write blood pressure results in a log or diary - follow low sodium diet - look over education mailed to you- low sodium diet - avoid salty snacks and fast food Follow Up Plan: Telephone follow up appointment with care management team member scheduled for:  08/24/2021      Plan:Telephone follow up appointment with care management team member scheduled for:  08/24/2021  Jacqlyn Larsen [facility], BSN RN Case Manager [place] [phone]

## 2021-06-29 NOTE — Patient Instructions (Signed)
Visit Information   PATIENT GOALS:   Goals Addressed             This Visit's Progress    Management of hyperlipidemia       Timeframe:  Long-Range Goal Priority:  Medium Start Date:            06/29/2021                 Expected End Date:      12/27/2021                 Follow Up Date 08/24/2021   - discuss my treatment options with the doctor or nurse - spend time outdoors at least 3 times a week  - avoid frying your food- bake or broil instead - follow heart healthy diet - look over education mailed to you- heart healthy diet - take medications as prescribed - try to exercise daily   Why is this important?   Having a long-term illness can be scary.  It can also be stressful for you and your caregiver.  These steps may help.    Notes:      Track and Manage My Blood Pressure-Hypertension       Timeframe:  Long-Range Goal Priority:  Medium Start Date:         06/29/2021                    Expected End Date:  12/27/2021                     Follow Up Date 08/24/2021   - check blood pressure 3 times per week - write blood pressure results in a log or diary - follow low sodium diet - look over education mailed to you- low sodium diet - avoid salty snacks and fast food      Why is this important?   You won't feel high blood pressure, but it can still hurt your blood vessels.  High blood pressure can cause heart or kidney problems. It can also cause a stroke.  Making lifestyle changes like losing a little weight or eating less salt will help.  Checking your blood pressure at home and at different times of the day can help to control blood pressure.  If the doctor prescribes medicine remember to take it the way the doctor ordered.  Call the office if you cannot afford the medicine or if there are questions about it.     Notes:         Consent to CCM Services: Ms. Schrom was given information about Chronic Care Management services including:  CCM service includes  personalized support from designated clinical staff supervised by her physician, including individualized plan of care and coordination with other care providers 24/7 contact phone numbers for assistance for urgent and routine care needs. Service will only be billed when office clinical staff spend 20 minutes or more in a month to coordinate care. Only one practitioner may furnish and bill the service in a calendar month. The patient may stop CCM services at any time (effective at the end of the month) by phone call to the office staff. The patient will be responsible for cost sharing (co-pay) of up to 20% of the service fee (after annual deductible is met).  Patient agreed to services and verbal consent obtained.   The patient verbalized understanding of instructions, educational materials, and care plan provided today and agreed to receive  a mailed copy of patient instructions, educational materials, and care plan.   Telephone follow up appointment with care management team member scheduled for:  11/2/2022Heart-Healthy Eating Plan Heart-healthy meal planning includes: Eating less unhealthy fats. Eating more healthy fats. Making other changes in your diet. Talk with your doctor or a diet specialist (dietitian) to create an eating plan that is right for you. What is my plan? Your doctor may recommend an eating plan that includes: Total fat: ______% or less of total calories a day. Saturated fat: ______% or less of total calories a day. Cholesterol: less than _________mg a day. What are tips for following this plan? Cooking Avoid frying your food. Try to bake, boil, grill, or broil it instead. You can also reduce fat by: Removing the skin from poultry. Removing all visible fats from meats. Steaming vegetables in water or broth. Meal planning  At meals, divide your plate into four equal parts: Fill one-half of your plate with vegetables and green salads. Fill one-fourth of your plate  with whole grains. Fill one-fourth of your plate with lean protein foods. Eat 4-5 servings of vegetables per day. A serving of vegetables is: 1 cup of raw or cooked vegetables. 2 cups of raw leafy greens. Eat 4-5 servings of fruit per day. A serving of fruit is: 1 medium whole fruit.  cup of dried fruit.  cup of fresh, frozen, or canned fruit.  cup of 100% fruit juice. Eat more foods that have soluble fiber. These are apples, broccoli, carrots, beans, peas, and barley. Try to get 20-30 g of fiber per day. Eat 4-5 servings of nuts, legumes, and seeds per week: 1 serving of dried beans or legumes equals  cup after being cooked. 1 serving of nuts is  cup. 1 serving of seeds equals 1 tablespoon. General information Eat more home-cooked food. Eat less restaurant, buffet, and fast food. Limit or avoid alcohol. Limit foods that are high in starch and sugar. Avoid fried foods. Lose weight if you are overweight. Keep track of how much salt (sodium) you eat. This is important if you have high blood pressure. Ask your doctor to tell you more about this. Try to add vegetarian meals each week. Fats Choose healthy fats. These include olive oil and canola oil, flaxseeds, walnuts, almonds, and seeds. Eat more omega-3 fats. These include salmon, mackerel, sardines, tuna, flaxseed oil, and ground flaxseeds. Try to eat fish at least 2 times each week. Check food labels. Avoid foods with trans fats or high amounts of saturated fat. Limit saturated fats. These are often found in animal products, such as meats, butter, and cream. These are also found in plant foods, such as palm oil, palm kernel oil, and coconut oil. Avoid foods with partially hydrogenated oils in them. These have trans fats. Examples are stick margarine, some tub margarines, cookies, crackers, and other baked goods. What foods can I eat? Fruits All fresh, canned (in natural juice), or frozen fruits. Vegetables Fresh or frozen  vegetables (raw, steamed, roasted, or grilled). Green salads. Grains Most grains. Choose whole wheat and whole grains most of the time. Rice and pasta, including brown rice and pastas made with whole wheat. Meats and other proteins Lean, well-trimmed beef, veal, pork, and lamb. Chicken and [place] without skin. All fish and shellfish. Wild duck, rabbit, pheasant, and venison. Egg whites or low-cholesterol egg substitutes. Dried beans, peas, lentils, and tofu. Seeds and most nuts. Dairy Low-fat or nonfat cheeses, including ricotta and mozzarella. Skim or 1% milk  that is liquid, powdered, or evaporated. Buttermilk that is made with low-fat milk. Nonfat or low-fat yogurt. Fats and oils Non-hydrogenated (trans-free) margarines. Vegetable oils, including soybean, sesame, sunflower, olive, peanut, safflower, corn, canola, and cottonseed. Salad dressings or mayonnaise made with a vegetable oil. Beverages Mineral water. Coffee and tea. Diet carbonated beverages. Sweets and desserts Sherbet, gelatin, and fruit ice. Small amounts of dark chocolate. Limit all sweets and desserts. Seasonings and condiments All seasonings and condiments. The items listed above may not be a complete list of foods and drinks you can eat. Contact a dietitian for more options. What foods should I avoid? Fruits Canned fruit in heavy syrup. Fruit in cream or butter sauce. Fried fruit. Limit coconut. Vegetables Vegetables cooked in cheese, cream, or butter sauce. Fried vegetables. Grains Breads that are made with saturated or trans fats, oils, or whole milk. Croissants. Sweet rolls. Donuts. High-fat crackers, such as cheese crackers. Meats and other proteins Fatty meats, such as hot dogs, ribs, sausage, bacon, rib-eye roast or steak. High-fat deli meats, such as salami and bologna. Caviar. Domestic duck and goose. Organ meats, such as liver. Dairy Cream, sour cream, cream cheese, and creamed cottage cheese. Whole-milk cheeses.  Whole or 2% milk that is liquid, evaporated, or condensed. Whole buttermilk. Cream sauce or high-fat cheese sauce. Yogurt that is made from whole milk. Fats and oils Meat fat, or shortening. Cocoa butter, hydrogenated oils, palm oil, coconut oil, palm kernel oil. Solid fats and shortenings, including bacon fat, salt pork, lard, and butter. Nondairy cream substitutes. Salad dressings with cheese or sour cream. Beverages Regular sodas and juice drinks with added sugar. Sweets and desserts Frosting. Pudding. Cookies. Cakes. Pies. Milk chocolate or white chocolate. Buttered syrups. Full-fat ice cream or ice cream drinks. The items listed above may not be a complete list of foods and drinks to avoid. Contact a dietitian for more information. Summary Heart-healthy meal planning includes eating less unhealthy fats, eating more healthy fats, and making other changes in your diet. Eat a balanced diet. This includes fruits and vegetables, low-fat or nonfat dairy, lean protein, nuts and legumes, whole grains, and heart-healthy oils and fats. This information is not intended to replace advice given to you by your health care provider. Make sure you discuss any questions you have with your health care provider. Document Revised: 02/17/2021 Document Reviewed: 02/17/2021 Elsevier Patient Education  2022 [place]. Low-Sodium Eating Plan Sodium, which is an element that makes up salt, helps you maintain a healthy balance of fluids in your body. Too much sodium can increase your blood pressure and cause fluid and waste to be held in your body. Your health care provider or dietitian may recommend following this plan if you have high blood pressure (hypertension), kidney disease, liver disease, or heart failure. Eating less sodium can help lower your blood pressure, reduce swelling, and protect your heart, liver, and kidneys. What are tips for following this plan? Reading food labels The Nutrition Facts label  lists the amount of sodium in one serving of the food. If you eat more than one serving, you must multiply the listed amount of sodium by the number of servings. Choose foods with less than 140 mg of sodium per serving. Avoid foods with 300 mg of sodium or more per serving. Shopping  Look for lower-sodium products, often labeled as "low-sodium" or "no salt added." Always check the sodium content, even if foods are labeled as "unsalted" or "no salt added." Buy fresh foods. Avoid canned foods  and pre-made or frozen meals. Avoid canned, cured, or processed meats. Buy breads that have less than 80 mg of sodium per slice. Cooking  Eat more home-cooked food and less restaurant, buffet, and fast food. Avoid adding salt when cooking. Use salt-free seasonings or herbs instead of table salt or sea salt. Check with your health care provider or pharmacist before using salt substitutes. Cook with plant-based oils, such as canola, sunflower, or olive oil. Meal planning When eating at a restaurant, ask that your food be prepared with less salt or no salt, if possible. Avoid dishes labeled as brined, pickled, cured, smoked, or made with soy sauce, miso, or teriyaki sauce. Avoid foods that contain MSG (monosodium glutamate). MSG is sometimes added to [place] food, bouillon, and some canned foods. Make meals that can be grilled, baked, poached, roasted, or steamed. These are generally made with less sodium. General information Most people on this plan should limit their sodium intake to 1,500-2,000 mg (milligrams) of sodium each day. What foods should I eat? Fruits Fresh, frozen, or canned fruit. Fruit juice. Vegetables Fresh or frozen vegetables. "No salt added" canned vegetables. "No salt added" tomato sauce and paste. Low-sodium or reduced-sodium tomato and vegetable juice. Grains Low-sodium cereals, including oats, puffed wheat and rice, and shredded wheat. Low-sodium crackers. Unsalted rice. Unsalted  pasta. Low-sodium bread. Whole-grain breads and whole-grain pasta. Meats and other proteins Fresh or frozen (no salt added) meat, poultry, seafood, and fish. Low-sodium canned tuna and salmon. Unsalted nuts. Dried peas, beans, and lentils without added salt. Unsalted canned beans. Eggs. Unsalted nut butters. Dairy Milk. Soy milk. Cheese that is naturally low in sodium, such as ricotta cheese, fresh mozzarella, or Swiss cheese. Low-sodium or reduced-sodium cheese. Cream cheese. Yogurt. Seasonings and condiments Fresh and dried herbs and spices. Salt-free seasonings. Low-sodium mustard and ketchup. Sodium-free salad dressing. Sodium-free light mayonnaise. Fresh or refrigerated horseradish. Lemon juice. Vinegar. Other foods Homemade, reduced-sodium, or low-sodium soups. Unsalted popcorn and pretzels. Low-salt or salt-free chips. The items listed above may not be a complete list of foods and beverages you can eat. Contact a dietitian for more information. What foods should I avoid? Vegetables Sauerkraut, pickled vegetables, and relishes. Olives. [place] fries. Onion rings. Regular canned vegetables (not low-sodium or reduced-sodium). Regular canned tomato sauce and paste (not low-sodium or reduced-sodium). Regular tomato and vegetable juice (not low-sodium or reduced-sodium). Frozen vegetables in sauces. Grains Instant hot cereals. Bread stuffing, pancake, and biscuit mixes. Croutons. Seasoned rice or pasta mixes. Noodle soup cups. Boxed or frozen macaroni and cheese. Regular salted crackers. Self-rising flour. Meats and other proteins Meat or fish that is salted, canned, smoked, spiced, or pickled. Precooked or cured meat, such as sausages or meat loaves. Berniece Salines. Ham. Pepperoni. Hot dogs. Corned beef. Chipped beef. Salt pork. Jerky. Pickled herring. Anchovies and sardines. Regular canned tuna. Salted nuts. Dairy Processed cheese and cheese spreads. Hard cheeses. Cheese curds. Blue cheese. Feta cheese.  String cheese. Regular cottage cheese. Buttermilk. Canned milk. Fats and oils Salted butter. Regular margarine. Ghee. Bacon fat. Seasonings and condiments Onion salt, garlic salt, seasoned salt, table salt, and sea salt. Canned and packaged gravies. Worcestershire sauce. Tartar sauce. Barbecue sauce. Teriyaki sauce. Soy sauce, including reduced-sodium. Steak sauce. Fish sauce. Oyster sauce. Cocktail sauce. Horseradish that you find on the shelf. Regular ketchup and mustard. Meat flavorings and tenderizers. Bouillon cubes. Hot sauce. Pre-made or packaged marinades. Pre-made or packaged taco seasonings. Relishes. Regular salad dressings. Salsa. Other foods Salted popcorn and pretzels. Corn chips and  puffs. Potato and tortilla chips. Canned or dried soups. Pizza. Frozen entrees and pot pies. The items listed above may not be a complete list of foods and beverages you should avoid. Contact a dietitian for more information. Summary Eating less sodium can help lower your blood pressure, reduce swelling, and protect your heart, liver, and kidneys. Most people on this plan should limit their sodium intake to 1,500-2,000 mg (milligrams) of sodium each day. Canned, boxed, and frozen foods are high in sodium. Restaurant foods, fast foods, and pizza are also very high in sodium. You also get sodium by adding salt to food. Try to cook at home, eat more fresh fruits and vegetables, and eat less fast food and canned, processed, or prepared foods. This information is not intended to replace advice given to you by your health care provider. Make sure you discuss any questions you have with your health care provider. Document Revised: 11/14/2019 Document Reviewed: 09/10/2019 Elsevier Patient Education  2022 [place] [facility], BSN RN Case Manager [facility] [phone]   CLINICAL CARE PLAN: Patient Care Plan: Hyperlipidemia     Problem Identified: Health Promotion or Disease  Self-Management (Hyperlipidemia)   Priority: Medium     Long-Range Goal: Self-Management Plan Developed for hyperlipidemia   Start Date: 06/29/2021  Expected End Date: 12/27/2021  This Visit's Progress: On track  Priority: Medium  Note:   Current Barriers:  Poorly controlled hyperlipidemia, complicated by diet, does not follow a special diet, no formal exercise plan, works in garden and flowers, delivers meals on wheels Current antihyperlipidemic regimen: unable to tolerate statins, pt reports take red yeast rice Most recent lipid panel:     Component Value Date/Time   CHOL 208 (H) 04/01/2021 0929   TRIG 131 04/01/2021 0929   HDL 34 (L) 04/01/2021 0929   CHOLHDL 6.1 (H) 04/01/2021 0929   CHOLHDL 4.0 01/29/2014 0830   VLDL 16 01/29/2014 0830   LDLCALC 150 (H) 04/01/2021 0929  Patient reports her son lives with her, she is independent in all aspects of her care, still drives, works 3 days per week, remains active at home and in community. ASCVD risk enhancing conditions: age >28, HTN Does not adhere to provider recommendations re:  RN Care Manager Clinical Goal(s):  patient will work with RN Care Manager, providers, and care team towards execution of optimized self-health management plan patient will take all medications exactly as prescribed and will call provider for medication related questions patient will attend all scheduled medical appointments: Dr. Wolfgang Phoenix 10/19/21 Interventions: Collaboration with Kathyrn Drown, MD regarding development and update of comprehensive plan of care as evidenced by provider attestation and co-signature Inter-disciplinary care team collaboration (see longitudinal plan of care) Medication review performed; medication list updated in electronic medical record.  Inter-disciplinary care team collaboration (see longitudinal plan of care) Provided education to patient re: hyperlipidemia and heart healthy diet Reviewed medications with patient and discussed  importance of compliance Discussed plans with patient for ongoing care management follow up and provided patient with direct contact information for care management team Mailed education to pt- low sodium diet Patient Goals/Self-Care Activities: - call for medicine refill 2 or 3 days before it runs out - keep a list of all the medicines I take; vitamins and herbals too - change to whole grain breads, cereal, pasta - drink 6 to 8 glasses of water each day - eat 3 to 5 servings of fruits and vegetables each day - fill half the  plate with nonstarchy vegetables - limit fast food meals to no more than 1 per week - manage portion size - read food labels for fat, fiber, carbohydrates and portion size do- discuss my treatment options with the doctor or nurse - spend time outdoors at least 3 times a week  - avoid frying your food- bake or broil instead - follow heart healthy diet - look over education mailed to you- heart healthy diet - take medications as prescribed - try to exercise daily Follow Up Plan: Telephone follow up appointment with care management team member scheduled for:  08/24/2021      Patient Care Plan: Hypertension (Adult)     Problem Identified: Hypertension (Hypertension)   Priority: Medium     Long-Range Goal: Hypertension Monitored   Start Date: 06/29/2021  Expected End Date: 12/27/2021  This Visit's Progress: On track  Priority: Medium  Note:   Objective:  Last practice recorded BP readings:  BP Readings from Last 3 Encounters:  04/20/21 138/66  12/03/20 (!) 150/72  09/29/20 138/84   Most recent eGFR/CrCl:  Lab Results  Component Value Date   EGFR 79 04/01/2021    No components found for: CRCL Current Barriers:  Knowledge Deficits related to basic understanding of hypertension pathophysiology and self care management- pt does not check blood pressure regularly, checks maybe once weekly Does not adhere to provider recommendations re: monitoring blood  pressure, does not adhere to a special diet Case Manager Clinical Goal(s):  patient will verbalize understanding of plan for hypertension management patient will attend all scheduled medical appointments patient will demonstrate improved adherence to prescribed treatment plan for hypertension as evidenced by taking all medications as prescribed, monitoring and recording blood pressure as directed, adhering to low sodium/DASH diet Interventions:  Collaboration with Kathyrn Drown, MD regarding development and update of comprehensive plan of care as evidenced by provider attestation and co-signature Inter-disciplinary care team collaboration (see longitudinal plan of care) Evaluation of current treatment plan related to hypertension self management and patient's adherence to plan as established by provider. Provided education to patient re: stroke prevention, s/s of heart attack and stroke, DASH diet, complications of uncontrolled blood pressure Mailed education to patient- low sodium diet Reviewed medications with patient and discussed importance of compliance Discussed plans with patient for ongoing care management follow up and provided patient with direct contact information for care management team Advised patient, providing education and rationale, to monitor blood pressure 3 x per week and record, calling PCP for findings outside established parameters.  Reviewed scheduled/upcoming provider appointments including: primary care provider 10/19/21 Reviewed importance of exercising Self-Care Activities:  Attends all scheduled provider appointments Calls provider office for new concerns, questions, or BP outside discussed parameters Checks BP and records as discussed Follows a low sodium diet/DASH diet Patient Goals: - check blood pressure 3 times per week - write blood pressure results in a log or diary - follow low sodium diet - look over education mailed to you- low sodium diet - avoid  salty snacks and fast food Follow Up Plan: Telephone follow up appointment with care management team member scheduled for:  08/24/2021

## 2021-07-14 ENCOUNTER — Telehealth: Payer: Self-pay | Admitting: Family Medicine

## 2021-07-14 NOTE — Telephone Encounter (Signed)
  Left message for patient to call back and schedule Medicare Annual Wellness Visit (AWV) in office.   If unable to come into the office for AWV,  please offer to do virtually or by telephone.  No hx of AWV eligible for AWVI as of  02/18/2016  Please schedule at anytime with [facility]-Nurse Health Advisor.      40 Minutes appointment   Any questions, please call me at [phone]

## 2021-07-22 DIAGNOSIS — E785 Hyperlipidemia, unspecified: Secondary | ICD-10-CM | POA: Diagnosis not present

## 2021-07-22 DIAGNOSIS — I10 Essential (primary) hypertension: Secondary | ICD-10-CM

## 2021-07-26 ENCOUNTER — Other Ambulatory Visit: Payer: Self-pay

## 2021-07-26 ENCOUNTER — Ambulatory Visit (INDEPENDENT_AMBULATORY_CARE_PROVIDER_SITE_OTHER): Payer: Medicare Other

## 2021-07-26 VITALS — Ht 68.0 in | Wt 194.8 lb

## 2021-07-26 DIAGNOSIS — Z0000 Encounter for general adult medical examination without abnormal findings: Secondary | ICD-10-CM

## 2021-07-26 NOTE — Progress Notes (Addendum)
Subjective:   YOVANA SCOGIN is a 80 y.o. female who presents for Medicare Annual (Subsequent) preventive examination. I have reviewed and agree with the above visit documentation Sallee Lange MD [facility]  Review of Systems     Cardiac Risk Factors include: hypertension;dyslipidemia;advanced age (>80men, >5 women);sedentary lifestyle     Objective:    Today's Vitals   07/26/21 1604  Weight: 194 lb 12.8 oz (88.4 kg)  Height: 5\' 8"  (1.727 m)   Body mass index is 29.62 kg/m.  Advanced Directives 07/26/2021 06/29/2021 06/28/2019 08/20/2018 06/26/2017 06/13/2017 06/08/2017  Does Patient Have a Medical Advance Directive? Yes Yes Yes No No No No  Type of Paramedic of [place];Living will [place];Living will [place];Living will - - - -  Does patient want to make changes to medical advance directive? - No - Patient declined - - - - -  Copy of [place] in Chart? No - copy requested No - copy requested - - - - -  Would patient like information on creating a medical advance directive? No - Patient declined - - - No - Patient declined No - Patient declined No - Patient declined    Current Medications (verified) Outpatient Encounter Medications as of 07/26/2021  Medication Sig   bisoprolol-hydrochlorothiazide (ZIAC) 2.5-6.25 MG tablet Take 1 tablet by mouth daily.   diphenhydrAMINE (BENADRYL) 25 MG tablet Take 25 mg by mouth every 6 (six) hours as needed for allergies (swelling).   esomeprazole (NEXIUM) 40 MG capsule Take one capsule po qd prn acid reflux   RESTASIS MULTIDOSE 0.05 % ophthalmic emulsion Place 1 drop into both eyes 2 (two) times daily.    verapamil (CALAN-SR) 240 MG CR tablet Take 1 tablet (240 mg total) by mouth daily.   ondansetron (ZOFRAN-ODT) 8 MG disintegrating tablet Take 1 tablet (8 mg total) by mouth every 8 (eight) hours as needed for nausea or vomiting. (Patient not  taking: Reported on 07/26/2021)   No facility-administered encounter medications on file as of 07/26/2021.    Allergies (verified) Other, Penicillins, Salmon [fish allergy], and Statins   History: Past Medical History:  Diagnosis Date   Allergy    Angioedema    Arthritis    GERD (gastroesophageal reflux disease)    Headache    Hx of echocardiogram 10/2005   normal   Hyperlipidemia    Hypertension    Thyroid disease    treated with radiation in 1980s    Past Surgical History:  Procedure Laterality Date   ABDOMINAL HYSTERECTOMY     CHOLECYSTECTOMY     COLONOSCOPY  06/15/2011   Procedure: COLONOSCOPY;  Surgeon: Rogene Houston, MD;  Location: [facility];  Service: Endoscopy;  Laterality: N/A;   COLONOSCOPY N/A 07/27/2016   Procedure: COLONOSCOPY;  Surgeon: Rogene Houston, MD;  Location: [facility];  Service: Endoscopy;  Laterality: N/A;  230   ESOPHAGOGASTRODUODENOSCOPY N/A 12/08/2016   Procedure: ESOPHAGOGASTRODUODENOSCOPY (EGD);  Surgeon: Rogene Houston, MD;  Location: [facility];  Service: Endoscopy;  Laterality: N/A;  120   EYE SURGERY     removed cyst from right eye in june 2015. 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   Procedure: LEFT TOTAL KNEE ARTHROPLASTY;  Surgeon: Gaynelle Arabian, MD;  Location: [facility];  Service: Orthopedics;  Laterality:  Left;   TUBAL LIGATION     Family History  Problem Relation Age of Onset   Hypertension Mother    Coronary artery disease Mother    Hypertension Father    Coronary artery disease Father    Heart failure Sister    Hypertension Sister    Cancer Brother    Diabetes Brother    Social History   Socioeconomic History   Marital status: Widowed    Spouse name: Not on file   Number of children: 3   Years of education: Not on file   Highest education level: Not on file  Occupational History   Not on file  Tobacco  Use   Smoking status: Never   Smokeless tobacco: Never  Substance and Sexual Activity   Alcohol use: No   Drug use: No   Sexual activity: Not Currently    Birth control/protection: Surgical  Other Topics Concern   Not on file  Social History Narrative   Worked at [employer] and [employer] before retired.    Works part-time.    Widow since 2017.   Social Determinants of Health   Financial Resource Strain: Low Risk    Difficulty of Paying Living Expenses: Not hard at all  Food Insecurity: No Food Insecurity   Worried About Charity fundraiser in the Last Year: Never true   [place] in the Last Year: Never true  Transportation Needs: No Transportation Needs   Lack of Transportation (Medical): No   Lack of Transportation (Non-Medical): No  Physical Activity: Insufficiently Active   Days of Exercise per Week: 3 days   Minutes of Exercise per Session: 20 min  Stress: No Stress Concern Present   Feeling of Stress : Not at all  Social Connections: Moderately Integrated   Frequency of Communication with Friends and Family: More than three times a week   Frequency of Social Gatherings with Friends and Family: More than three times a week   Attends Religious Services: More than 4 times per year   Active Member of [employer] or Organizations: Yes   Attends Archivist Meetings: More than 4 times per year   Marital Status: Widowed    Tobacco Counseling Counseling given: Not Answered   Clinical Intake:                 Diabetic?NO         Activities of Daily Living In your present state of health, do you have any difficulty performing the following activities: 07/26/2021  Hearing? Y  Vision? N  Difficulty concentrating or making decisions? N  Walking or climbing stairs? N  Dressing or bathing? N  Doing errands, shopping? N  Preparing Food and eating ? N  Using the Toilet? N  In the past six months, have you accidently leaked urine? Y  Comment Since  Hyst.  Do you have problems with loss of bowel control? N  Managing your Medications? N  Managing your Finances? N  Housekeeping or managing your Housekeeping? N  Some recent data might be hidden    Patient Care Team: Kathyrn Drown, MD as PCP - General (Family Medicine) Kassie Mends, RN as August any recent Kenneth you may have received from other than [facility] providers in the past year (date may be approximate).     Assessment:   This is a routine wellness examination for Catilyn.  Hearing/Vision screen Hearing Screening - Comments:: Hearing  issues. Wears hearing aids. Vision Screening - Comments:: Glasses, up to date on eye exam.   Dietary issues and exercise activities discussed: Current Exercise Habits: Home exercise routine, Type of exercise: walking, Time (Minutes): 20, Frequency (Times/Week): 3, Weekly Exercise (Minutes/Week): 60, Intensity: Mild, Exercise limited by: cardiac condition(s)   Goals Addressed             This Visit's Progress    Exercise 3x per week (30 min per time)         Depression Screen PHQ 2/9 Scores 07/26/2021 06/29/2021 06/29/2021 04/20/2021 09/29/2020 03/31/2020 05/03/2017  PHQ - 2 Score 0 0 0 0 0 0 1  PHQ- 9 Score - - - - - - 2    Fall Risk Fall Risk  07/26/2021 06/29/2021 04/20/2021 09/29/2020 10/06/2019  Falls in the past year? 1 1 1  0 0  Number falls in past yr: 0 0 0 0 -  Injury with Fall? 0 0 1 0 -  Risk for fall due to : Impaired balance/gait;Impaired vision - No Fall Risks - -  Follow up Falls prevention discussed - Falls evaluation completed Falls evaluation completed Falls evaluation completed    FALL RISK PREVENTION PERTAINING TO THE HOME:  Any stairs in or around the home? No  If so, are there any without handrails? No  Home free of loose throw rugs in walkways, pet beds, electrical cords, etc? Yes  Adequate lighting in your home to reduce risk of falls? Yes   ASSISTIVE DEVICES  UTILIZED TO PREVENT FALLS:  Life alert? No  Use of a cane, walker or w/c? No  Grab bars in the bathroom? Yes  Shower chair or bench in shower? Yes  Elevated toilet seat or a handicapped toilet? Yes   TIMED UP AND GO:  Was the test performed? Yes .  Length of time to ambulate 10 feet: 10 sec.   Gait steady and fast without use of assistive device  Cognitive Function: Normal cognitive status assessed by direct observation by this Nurse Health Advisor. No abnormalities found.          Immunizations Immunization History  Administered Date(s) Administered   DT (Pediatric) 04/21/2013   Fluad Quad(high Dose 65+) 07/31/2020   Influenza, High Dose Seasonal PF 07/09/2017   Influenza,inj,Quad PF,6+ Mos 08/23/2018   Influenza,inj,quad, With Preservative 08/05/2019   Influenza-Unspecified 07/21/2013, 08/05/2014, 07/13/2015, 07/19/2016, 07/09/2017, 08/05/2019   Pneumococcal Conjugate-13 02/17/2015   Pneumococcal-Unspecified 07/22/2006   Tdap 04/27/2021   Zoster, Live 04/18/2011    TDAP status: Up to date  Flu Vaccine status: Due, Education has been provided regarding the importance of this vaccine. Advised may receive this vaccine at local pharmacy or Health Dept. Aware to provide a copy of the vaccination record if obtained from local pharmacy or Health Dept. Verbalized acceptance and understanding.  Pneumococcal vaccine status: Up to date  Covid-19 vaccine status: Information provided on how to obtain vaccines.   Qualifies for Shingles Vaccine? Yes   Zostavax completed Yes   Shingrix Completed?: No.    Education has been provided regarding the importance of this vaccine. Patient has been advised to call insurance company to determine out of pocket expense if they have not yet received this vaccine. Advised may also receive vaccine at local pharmacy or Health Dept. Verbalized acceptance and understanding.  Screening Tests Health Maintenance  Topic Date Due   COVID-19 Vaccine (1)  Never done   Zoster Vaccines- Shingrix (1 of 2) Never done   INFLUENZA VACCINE  05/23/2021  COLONOSCOPY (Pts 45-21yrs Insurance coverage will need to be confirmed)  07/27/2021   TETANUS/TDAP  04/28/2031   DEXA SCAN  Completed   HPV VACCINES  Aged Out    Health Maintenance  Health Maintenance Due  Topic Date Due   COVID-19 Vaccine (1) Never done   Zoster Vaccines- Shingrix (1 of 2) Never done   INFLUENZA VACCINE  05/23/2021    Colorectal cancer screening: Type of screening: Colonoscopy. Completed 07/27/2016. Repeat every 5 years  Mammogram status: No longer required due to age.  Bone Density status: Completed 08/28/2016. Results reflect: Bone density results: NORMAL. Repeat every 2 years.  Lung Cancer Screening: (Low Dose CT Chest recommended if Age 83-80 years, 30 pack-year currently smoking OR have quit w/in 15years.) does not qualify.    Additional Screening:  Hepatitis C Screening: does not qualify.  Vision Screening: Recommended annual ophthalmology exams for early detection of glaucoma and other disorders of the eye. Is the patient up to date with their annual eye exam?  Yes  Who is the provider or what is the name of the office in which the patient attends annual eye exams?  If pt is not established with a provider, would they like to be referred to a provider to establish care? No .   Dental Screening: Recommended annual dental exams for proper oral hygiene  Community Resource Referral / Chronic Care Management: CRR required this visit?  No   CCM required this visit?  No      Plan:     I have personally reviewed and noted the following in the patient's chart:   Medical and social history Use of alcohol, tobacco or illicit drugs  Current medications and supplements including opioid prescriptions.  Functional ability and status Nutritional status Physical activity Advanced directives List of other physicians Hospitalizations, surgeries, and ER visits in  previous 12 months Vitals Screenings to include cognitive, depression, and falls Referrals and appointments  In addition, I have reviewed and discussed with patient certain preventive protocols, quality metrics, and best practice recommendations. A written personalized care plan for preventive services as well as general preventive health recommendations were provided to patient.     Chriss Driver, LPN   [id number]   Nurse Notes: Doing well. Has had 2 Covid vaccines. Asked pt to bring copy of card for documentation. Has f/u appt with Dr. Dereck Leep on 08/15/21.

## 2021-07-26 NOTE — Patient Instructions (Signed)
[name] , Thank you for taking time to come for your Medicare Wellness Visit. I appreciate your ongoing commitment to your health goals. Please review the following plan we discussed and let me know if I can assist you in the future.   Screening recommendations/referrals: Colonoscopy: Done 07/27/2016 Repeat in 5 years Follow up with Dr. Dereck Leep 08/15/21 Mammogram: Done 06/06/2019 Repeat annually  Bone Density: Done 08/28/2016 Repeat every 2 years    Recommended yearly ophthalmology/optometry visit for glaucoma screening and checkup Recommended yearly dental visit for hygiene and checkup  Vaccinations: Influenza vaccine: Done 07/31/2020 Repeat annually  Pneumococcal vaccine: Done 07/22/2006 and 01/28/2015 Tdap vaccine: Done 04/27/2021 Repeat in 10 years  Shingles vaccine: Done 04/18/2011   Covid-19:Done. Please bring a copy of card.  Advanced directives: Please bring a copy of your health care power of attorney and living will to the office to be added to your chart at your convenience.   Conditions/risks identified: Aim for 30 minutes of exercise or walking each day, drink 6-8 glasses of water and eat lots of fruits and vegetables.   Next appointment: Follow up in one year for your annual wellness visit 2023    Preventive Care 65 Years and Older, Female Preventive care refers to lifestyle choices and visits with your health care provider that can promote health and wellness. What does preventive care include? A yearly physical exam. This is also called an annual well check. Dental exams once or twice a year. Routine eye exams. Ask your health care provider how often you should have your eyes checked. Personal lifestyle choices, including: Daily care of your teeth and gums. Regular physical activity. Eating a healthy diet. Avoiding tobacco and drug use. Limiting alcohol use. Practicing safe sex. Taking low-dose aspirin every day. Taking vitamin and mineral supplements as  recommended by your health care provider. What happens during an annual well check? The services and screenings done by your health care provider during your annual well check will depend on your age, overall health, lifestyle risk factors, and family history of disease. Counseling  Your health care provider may ask you questions about your: Alcohol use. Tobacco use. Drug use. Emotional well-being. Home and relationship well-being. Sexual activity. Eating habits. History of falls. Memory and ability to understand (cognition). Work and work Statistician. Reproductive health. Screening  You may have the following tests or measurements: Height, weight, and BMI. Blood pressure. Lipid and cholesterol levels. These may be checked every 5 years, or more frequently if you are over 72 years old. Skin check. Lung cancer screening. You may have this screening every year starting at age 82 if you have a 30-pack-year history of smoking and currently smoke or have quit within the past 15 years. Fecal occult blood test (FOBT) of the stool. You may have this test every year starting at age 82. Flexible sigmoidoscopy or colonoscopy. You may have a sigmoidoscopy every 5 years or a colonoscopy every 10 years starting at age 37. Hepatitis C blood test. Hepatitis B blood test. Sexually transmitted disease (STD) testing. Diabetes screening. This is done by checking your blood sugar (glucose) after you have not eaten for a while (fasting). You may have this done every 1-3 years. Bone density scan. This is done to screen for osteoporosis. You may have this done starting at age 59. Mammogram. This may be done every 1-2 years. Talk to your health care provider about how often you should have regular mammograms. Talk with your health care provider about your  test results, treatment options, and if necessary, the need for more tests. Vaccines  Your health care provider may recommend certain vaccines, such  as: Influenza vaccine. This is recommended every year. Tetanus, diphtheria, and acellular pertussis (Tdap, Td) vaccine. You may need a Td booster every 10 years. Zoster vaccine. You may need this after age 76. Pneumococcal 13-valent conjugate (PCV13) vaccine. One dose is recommended after age 24. Pneumococcal polysaccharide (PPSV23) vaccine. One dose is recommended after age 20. Talk to your health care provider about which screenings and vaccines you need and how often you need them. This information is not intended to replace advice given to you by your health care provider. Make sure you discuss any questions you have with your health care provider. Document Released: 11/05/2015 Document Revised: 06/28/2016 Document Reviewed: 08/10/2015 Elsevier Interactive Patient Education  2017 [place] Prevention in the Home Falls can cause injuries. They can happen to people of all ages. There are many things you can do to make your home safe and to help prevent falls. What can I do on the outside of my home? Regularly fix the edges of walkways and driveways and fix any cracks. Remove anything that might make you trip as you walk through a door, such as a raised step or threshold. Trim any bushes or trees on the path to your home. Use bright outdoor lighting. Clear any walking paths of anything that might make someone trip, such as rocks or tools. Regularly check to see if handrails are loose or broken. Make sure that both sides of any steps have handrails. Any raised decks and porches should have guardrails on the edges. Have any leaves, snow, or ice cleared regularly. Use sand or salt on walking paths during winter. Clean up any spills in your garage right away. This includes oil or grease spills. What can I do in the bathroom? Use night lights. Install grab bars by the toilet and in the tub and shower. Do not use towel bars as grab bars. Use non-skid mats or decals in the tub or  shower. If you need to sit down in the shower, use a plastic, non-slip stool. Keep the floor dry. Clean up any water that spills on the floor as soon as it happens. Remove soap buildup in the tub or shower regularly. Attach bath mats securely with double-sided non-slip rug tape. Do not have throw rugs and other things on the floor that can make you trip. What can I do in the bedroom? Use night lights. Make sure that you have a light by your bed that is easy to reach. Do not use any sheets or blankets that are too big for your bed. They should not hang down onto the floor. Have a firm chair that has side arms. You can use this for support while you get dressed. Do not have throw rugs and other things on the floor that can make you trip. What can I do in the kitchen? Clean up any spills right away. Avoid walking on wet floors. Keep items that you use a lot in easy-to-reach places. If you need to reach something above you, use a strong step stool that has a grab bar. Keep electrical cords out of the way. Do not use floor polish or wax that makes floors slippery. If you must use wax, use non-skid floor wax. Do not have throw rugs and other things on the floor that can make you trip. What can I do with my  stairs? Do not leave any items on the stairs. Make sure that there are handrails on both sides of the stairs and use them. Fix handrails that are broken or loose. Make sure that handrails are as long as the stairways. Check any carpeting to make sure that it is firmly attached to the stairs. Fix any carpet that is loose or worn. Avoid having throw rugs at the top or bottom of the stairs. If you do have throw rugs, attach them to the floor with carpet tape. Make sure that you have a light switch at the top of the stairs and the bottom of the stairs. If you do not have them, ask someone to add them for you. What else can I do to help prevent falls? Wear shoes that: Do not have high heels. Have  rubber bottoms. Are comfortable and fit you well. Are closed at the toe. Do not wear sandals. If you use a stepladder: Make sure that it is fully opened. Do not climb a closed stepladder. Make sure that both sides of the stepladder are locked into place. Ask someone to hold it for you, if possible. Clearly mark and make sure that you can see: Any grab bars or handrails. First and last steps. Where the edge of each step is. Use tools that help you move around (mobility aids) if they are needed. These include: Canes. Walkers. Scooters. Crutches. Turn on the lights when you go into a dark area. Replace any light bulbs as soon as they burn out. Set up your furniture so you have a clear path. Avoid moving your furniture around. If any of your floors are uneven, fix them. If there are any pets around you, be aware of where they are. Review your medicines with your doctor. Some medicines can make you feel dizzy. This can increase your chance of falling. Ask your doctor what other things that you can do to help prevent falls. This information is not intended to replace advice given to you by your health care provider. Make sure you discuss any questions you have with your health care provider. Document Released: 08/05/2009 Document Revised: 03/16/2016 Document Reviewed: 11/13/2014 Elsevier Interactive Patient Education  2017 [employer].

## 2021-08-15 ENCOUNTER — Encounter (INDEPENDENT_AMBULATORY_CARE_PROVIDER_SITE_OTHER): Payer: Self-pay | Admitting: Gastroenterology

## 2021-08-15 ENCOUNTER — Other Ambulatory Visit: Payer: Self-pay

## 2021-08-15 ENCOUNTER — Ambulatory Visit (INDEPENDENT_AMBULATORY_CARE_PROVIDER_SITE_OTHER): Payer: Medicare Other | Admitting: Gastroenterology

## 2021-08-15 VITALS — Ht 68.0 in | Wt 194.0 lb

## 2021-08-15 DIAGNOSIS — Z1211 Encounter for screening for malignant neoplasm of colon: Secondary | ICD-10-CM

## 2021-08-15 NOTE — Progress Notes (Signed)
Primary Care Physician:  Kathyrn Drown, MD  Primary GI: Rehman  Patient Location: Home  Provider Location: home   Reason for Visit: discussion regarding repeat screening colonoscopy   Persons present on the virtual encounter, with roles: Scherrie Gerlach, NP Brayton Caves, patient.   Total time (minutes) spent on medical discussion: 10 minutes   Telehealth visit via phone.  I connected with [name] on 08/15/21 at  9:30 AM EDT by telephone and verified that I am speaking with the correct person using two identifiers.   I discussed the limitations, risks, security and privacy concerns of performing an evaluation and management service by telephone and the availability of in person appointments. I also discussed with the patient that there may be a patient responsible charge related to this service. The patient expressed understanding and agreed to proceed.  Chief Complaint  Patient presents with   need for colonoscopy    Pt doing telephone call today to discuss next colonoscopy. Pt states last one was 2017 and she thought she was told she would not need to do another one.    History of Present Illness: [name]. Vohs is an 80 year old female with pmh of arthritis, GERD, HLD, HTN, and thyroid disease. She is presenting today for determination of repeat screening colonoscopy. Last colonoscopy was 5 years ago with one tubular adenoma without dysplasia. Patient's brother has hx of CRC in his 41s.   Patient reports overall she is doing very well. She continues to remain active and remains in good health. she denies any GI symptoms at this time. No red flag symptoms. Patient denies melena, hematochezia, nausea, vomiting, diarrhea, constipation, dysphagia, odyonophagia, early satiety or weight loss.   She states that at this time she does not feel like she wants to proceed with anymore screening colonoscopies, given her age.  Fam KW:IOXBDZH had colon cancer in his  55s Social hx: no etoh or tobacco Last colonoscopy: Oct 2017 polyp in sigmoid, tubular adenoma Last EGD:Feb 2018 slightly raised patches of pale mucosa in esophagus  - Z-line irregular, 38 cm from the incisors. - Non-bleeding erosive gastropathy. Biopsied. (H pylori, treated with pylera) - Normal ampulla, duodenal bulb and second portion of the duodenum.  Past Medical History:  Diagnosis Date   Allergy    Angioedema    Arthritis    GERD (gastroesophageal reflux disease)    Headache    Hx of echocardiogram 10/2005   normal   Hyperlipidemia    Hypertension    Thyroid disease    treated with radiation in 1980s      Past Surgical History:  Procedure Laterality Date   ABDOMINAL HYSTERECTOMY     CHOLECYSTECTOMY     COLONOSCOPY  06/15/2011   Procedure: COLONOSCOPY;  Surgeon: Rogene Houston, MD;  [facility]: [facility];  Service: Endoscopy;  Laterality: N/A;   COLONOSCOPY N/A 07/27/2016   Procedure: COLONOSCOPY;  Surgeon: Rogene Houston, MD;  Location: [facility];  Service: Endoscopy;  Laterality: N/A;  230   ESOPHAGOGASTRODUODENOSCOPY N/A 12/08/2016   Procedure: ESOPHAGOGASTRODUODENOSCOPY (EGD);  Surgeon: Rogene Houston, MD;  Location: [facility];  Service: Endoscopy;  Laterality: N/A;  120   EYE SURGERY     removed cyst from right eye in june 2015. 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   Procedure: LEFT TOTAL  KNEE ARTHROPLASTY;  Surgeon: Gaynelle Arabian, MD;  Location: [facility];  Service: Orthopedics;  Laterality: Left;   TUBAL LIGATION       Current Meds  Medication Sig   bisoprolol-hydrochlorothiazide (ZIAC) 2.5-6.25 MG tablet Take 1 tablet by mouth daily.   diphenhydrAMINE (BENADRYL) 25 MG tablet Take 25 mg by mouth every 6 (six) hours as needed for allergies (swelling).   esomeprazole (NEXIUM) 40 MG capsule Take one capsule po qd prn acid reflux   RESTASIS  MULTIDOSE 0.05 % ophthalmic emulsion Place 1 drop into both eyes 2 (two) times daily.    verapamil (CALAN-SR) 240 MG CR tablet Take 1 tablet (240 mg total) by mouth daily.     Family History  Problem Relation Age of Onset   Hypertension Mother    Coronary artery disease Mother    Hypertension Father    Coronary artery disease Father    Heart failure Sister    Hypertension Sister    Cancer Brother    Diabetes Brother     Social History   Socioeconomic History   Marital status: Widowed    Spouse name: Not on file   Number of children: 3   Years of education: Not on file   Highest education level: Not on file  Occupational History   Not on file  Tobacco Use   Smoking status: Never   Smokeless tobacco: Never  Substance and Sexual Activity   Alcohol use: No   Drug use: No   Sexual activity: Not Currently    Birth control/protection: Surgical  Other Topics Concern   Not on file  Social History Narrative   Worked at [employer] and [employer] before retired.    Works part-time.    Widow since 2017.   Social Determinants of Health   Financial Resource Strain: Low Risk    Difficulty of Paying Living Expenses: Not hard at all  Food Insecurity: No Food Insecurity   Worried About Charity fundraiser in the Last Year: Never true   [place] in the Last Year: Never true  Transportation Needs: No Transportation Needs   Lack of Transportation (Medical): No   Lack of Transportation (Non-Medical): No  Physical Activity: Insufficiently Active   Days of Exercise per Week: 3 days   Minutes of Exercise per Session: 20 min  Stress: No Stress Concern Present   Feeling of Stress : Not at all  Social Connections: Moderately Integrated   Frequency of Communication with Friends and Family: More than three times a week   Frequency of Social Gatherings with Friends and Family: More than three times a week   Attends Religious Services: More than 4 times per year   Active Member of  [employer] or Organizations: Yes   Attends Archivist Meetings: More than 4 times per year   Marital Status: Widowed    Review of Systems: Gen: Denies fever, chills, anorexia. Denies fatigue, weakness, weight loss.  CV: Denies chest pain, palpitations, syncope, peripheral edema, and claudication. Resp: Denies dyspnea at rest, cough, wheezing, coughing up blood, and pleurisy. GI: see HPI Derm: Denies rash, itching, dry skin Psych: Denies depression, anxiety, memory loss, confusion. No homicidal or suicidal ideation.  Heme: Denies bruising, bleeding, and enlarged lymph nodes.  Observations/Objective: No distress. Unable to perform physical exam due to telephone encounter. No video available.   Assessment and Plan: [name] is an 80 year old african [place] female with pmh of GERD, HLD, HTN, thyroid disease and  arthritis with personal history of tubular adenoma. Patient was scheduled today for determination if she would require repeat screening colonoscopy. Patient denies any red flag symptoms at this time. We discussed indications of colonoscopy, as she is at a higher risk given her family and personal history. At this time, patient would not like to proceed with any further screening colonoscopies due to her age. I did advise her that due to her higher risk from personal and family history, and current state of health, we recommend repeating her screening colonoscopy in order to ensure that we can evaluate for presence of pre cancerous polyps or malignancies early. Patient verbalized understanding and still advised that she would like to hold off on anymore screening colonoscopies at this time. I feel that this is reasonable given her age and lack of red flag symptoms. She will give [place] a call if she develops any new or concerning symptoms.  Follow Up Instructions: Follow up as needed   I discussed the assessment and treatment plan with the patient. The patient was provided an  opportunity to ask questions and all were answered. The patient agreed with the plan and demonstrated an understanding of the instructions.   The patient was advised to call back or seek an in-person evaluation if the symptoms worsen or if the condition fails to improve as anticipated.  I provided 10 minutes of non-face to face, telephone time for this encounter. [place] Sherr L. Alver Sorrow, MSN, APRN, AGNP-C Adult-Gerontology Nurse Practitioner [facility]

## 2021-08-15 NOTE — Patient Instructions (Signed)
We will hold off on doing any further colonoscopies, at your request. As we discussed, given your brother's history of colon cancer and your history of pre cancerous polyps, you are at a higher risk for developing colon cancer.  If you develop any new symptoms such as weight loss, diarrhea, constipation, abdominal pain, blood in your stools or black stools, please let us know. We will plan to see you on an as needed basis, otherwise.

## 2021-08-24 ENCOUNTER — Ambulatory Visit (INDEPENDENT_AMBULATORY_CARE_PROVIDER_SITE_OTHER): Payer: Medicare Other

## 2021-08-24 DIAGNOSIS — E785 Hyperlipidemia, unspecified: Secondary | ICD-10-CM

## 2021-08-24 DIAGNOSIS — I10 Essential (primary) hypertension: Secondary | ICD-10-CM

## 2021-08-24 NOTE — Patient Instructions (Signed)
Visit Information  The patient verbalized understanding of instructions, educational materials, and care plan provided today and declined offer to receive copy of patient instructions, educational materials, and care plan.   Telephone follow up appointment with care management team member scheduled for: 09/28/21 at 3:30 PM  [place], [facility], CDE Care Management Coordinator [phone], Mobile [phone]

## 2021-08-24 NOTE — Chronic Care Management (AMB) (Signed)
Chronic Care Management   CCM RN Visit Note  08/24/2021 Name: [name] MRN: [id number] DOB: [birth year]  Subjective: [name] is a 80 y.o. year old female who is a primary care patient of Luking, Elayne Snare, MD. The care management team was consulted for assistance with disease management and care coordination needs.    Engaged with patient by telephone for follow up visit in response to provider referral for case management and/or care coordination services.   Consent to Services:  The patient was given information about Chronic Care Management services, agreed to services, and gave verbal consent prior to initiation of services.  Please see initial visit note for detailed documentation.   Patient agreed to services and verbal consent obtained.   Assessment: Review of patient past medical history, allergies, medications, health status, including review of consultants reports, laboratory and other test data, was performed as part of comprehensive evaluation and provision of chronic care management services.   SDOH (Social Determinants of Health) assessments and interventions performed:    CCM Care Plan  Allergies  Allergen Reactions   Other     Peanut butter and Wheat Oats- causes tongue to swell    Penicillins Hives    Has patient had a PCN reaction causing immediate rash, facial/tongue/throat swelling, SOB or lightheadedness with hypotension: delayed reaction Has patient had a PCN reaction causing severe rash involving mucus membranes or skin necrosis: no Has patient had a PCN reaction that required hospitalization no Has patient had a PCN reaction occurring within the last 10 years: no If all of the above answers are "NO", then may proceed with Cephalosporin use.   Salmon [Fish Allergy] Other (See Comments)    Lips swelling   Statins     Body aches, myalgias    Outpatient Encounter Medications as of 08/24/2021  Medication Sig Note    bisoprolol-hydrochlorothiazide (ZIAC) 2.5-6.25 MG tablet Take 1 tablet by mouth daily.    diphenhydrAMINE (BENADRYL) 25 MG tablet Take 25 mg by mouth every 6 (six) hours as needed for allergies (swelling). 07/26/2021: PRN   esomeprazole (NEXIUM) 40 MG capsule Take one capsule po qd prn acid reflux    RESTASIS MULTIDOSE 0.05 % ophthalmic emulsion Place 1 drop into both eyes 2 (two) times daily.     verapamil (CALAN-SR) 240 MG CR tablet Take 1 tablet (240 mg total) by mouth daily.    No facility-administered encounter medications on file as of 08/24/2021.    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   Allergic reaction 05/13/2013   Hyperlipemia 04/24/2013   Hypertension 04/24/2013    Conditions to be addressed/monitored:HTN and HLD  Care Plan : Hyperlipidemia  Updates made by Dimitri Ped, RN since 08/24/2021 12:00 AM     Problem: Health Promotion or Disease Self-Management (Hyperlipidemia)   Priority: Medium     Long-Range Goal: Self-Management Plan Developed for hyperlipidemia   Start Date: 06/29/2021  Expected End Date: 12/27/2021  This Visit's Progress: On track  Recent Progress: On track  Priority: Medium  Note:   Current Barriers:  Poorly controlled hyperlipidemia, complicated by diet, does not  follow a special diet, no formal exercise plan, works in garden and flowers, delivers meals on wheels Current antihyperlipidemic regimen: unable to tolerate statins, pt reports take red yeast rice Most recent lipid panel:     Component Value Date/Time   CHOL 208 (H) 04/01/2021 0929   TRIG  131 04/01/2021 0929   HDL 34 (L) 04/01/2021 0929   CHOLHDL 6.1 (H) 04/01/2021 0929   CHOLHDL 4.0 01/29/2014 0830   VLDL 16 01/29/2014 0830   LDLCALC 150 (H) 04/01/2021 0929  Patient reports her son lives with her, she is independent in all aspects of her care, still drives, works 3 days per week, remains active at home and in community. ASCVD risk enhancing conditions: age >32, HTN Does not adhere to provider recommendations re:  States she has been trying to watch the fats and sodium in her diet.  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 Wolfgang Phoenix 10/19/21 Interventions: Collaboration with Kathyrn Drown, MD regarding development and update of comprehensive plan of care as evidenced by provider attestation and co-signature Inter-disciplinary care team collaboration (see longitudinal plan of care) Medication review performed; medication list updated in electronic medical record.  Inter-disciplinary care team collaboration (see longitudinal plan of care) Reinforced education to patient re: hyperlipidemia and heart healthy diet Reviewed medications with patient and discussed importance of compliance Discussed plans with patient for ongoing care management follow up and provided patient with direct contact information for care management team Mailed education to pt- low sodium diet Patient Goals/Self-Care Activities: - call for medicine refill 2 or 3 days before it runs out - keep a list of all the medicines I take; vitamins and herbals too - change to whole grain breads, cereal, pasta - drink 6 to 8 glasses of  water each day - eat 3 to 5 servings of fruits and vegetables each day - fill half the plate with nonstarchy vegetables - limit fast food meals to no more than 1 per week - manage portion size - read food labels for fat, fiber, carbohydrates and portion size do- discuss my treatment options with the doctor or nurse - spend time outdoors at least 3 times a week  - avoid frying your food- bake or broil instead - follow heart healthy diet - look over education mailed to you- heart healthy diet - take medications as prescribed - try to exercise daily Follow Up Plan: Telephone follow up appointment with care management team member scheduled for:  09/28/2021      Care Plan : Hypertension (Adult)  Updates made by Dimitri Ped, RN since 08/24/2021 12:00 AM     Problem: Hypertension (Hypertension)   Priority: Medium     Long-Range Goal: Hypertension Monitored   Start Date: 06/29/2021  Expected End Date: 12/27/2021  This Visit's Progress: On track  Recent Progress: On track  Priority: Medium  Note:   Objective:  Last practice recorded BP readings:  BP Readings from Last 3 Encounters:  04/20/21 138/66  12/03/20 (!) 150/72  09/29/20 138/84   Most recent eGFR/CrCl:  Lab Results  Component Value Date   EGFR 79 04/01/2021    No components  found for: CRCL Current Barriers:  Knowledge Deficits related to basic understanding of hypertension pathophysiology and self care management- pt does not check blood pressure regularly, checks maybe once weekly Does not adhere to provider recommendations re: monitoring blood pressure, does not adhere to a special diet States she has been checking her B/P 1-2 times a week and her last reading was 134/70.  States she is trying to follow a low sodium diet Case Manager Clinical Goal(s):  patient will verbalize understanding of plan for hypertension management patient will attend all scheduled medical appointments patient will demonstrate improved  adherence to prescribed treatment plan for hypertension as evidenced by taking all medications as prescribed, monitoring and recording blood pressure as directed, adhering to low sodium/DASH diet Interventions:  Collaboration with Kathyrn Drown, MD regarding development and update of comprehensive plan of care as evidenced by provider attestation and co-signature Inter-disciplinary care team collaboration (see longitudinal plan of care) Evaluation of current treatment plan related to hypertension self management and patient's adherence to plan as established by provider. Reinforced education to patient re: stroke prevention, s/s of heart attack and stroke, DASH diet, complications of uncontrolled blood pressure Mailed education to patient- low sodium diet-reviwed Reviewed medications with patient and discussed importance of compliance Discussed plans with patient for ongoing care management follow up and provided patient with direct contact information for care management team Reinforced to monitor blood pressure 3 x per week and record, calling PCP for findings outside established parameters.  Reviewed scheduled/upcoming provider appointments including: primary care provider 10/19/21 Reinforced importance of exercising Self-Care Activities:  Attends all scheduled provider appointments Calls provider office for new concerns, questions, or BP outside discussed parameters Checks BP and records as discussed Follows a low sodium diet/DASH diet Patient Goals: - check blood pressure 3 times per week - write blood pressure results in a log or diary - follow low sodium diet - look over education mailed to you- low sodium diet - avoid salty snacks and fast food Follow Up Plan: Telephone follow up appointment with care management team member scheduled for:  09/28/21/2022      Plan:Telephone follow up appointment with care management team member scheduled for:  09/28/21 The patient has been  provided with contact information for the care management team and has been advised to call with any health related questions or concerns.  Peter Garter RN, BSN,CCM, CDE Care Management Coordinator [phone], Mobile [phone]

## 2021-08-31 ENCOUNTER — Other Ambulatory Visit: Payer: Self-pay | Admitting: Family Medicine

## 2021-09-21 DIAGNOSIS — I10 Essential (primary) hypertension: Secondary | ICD-10-CM | POA: Diagnosis not present

## 2021-09-21 DIAGNOSIS — E785 Hyperlipidemia, unspecified: Secondary | ICD-10-CM | POA: Diagnosis not present

## 2021-09-27 ENCOUNTER — Telehealth: Payer: Self-pay | Admitting: Family Medicine

## 2021-09-27 NOTE — Telephone Encounter (Signed)
Patient is requesting something called in for acid reflux to [facility]  CB# [phone]

## 2021-09-27 NOTE — Telephone Encounter (Signed)
Left message to return call. Pt has Nexium on list-needing to know symptoms and if taking Nexium

## 2021-09-28 ENCOUNTER — Telehealth: Payer: Medicare Other

## 2021-09-28 ENCOUNTER — Other Ambulatory Visit: Payer: Self-pay

## 2021-09-28 MED ORDER — ESOMEPRAZOLE MAGNESIUM 40 MG PO CPDR
DELAYED_RELEASE_CAPSULE | ORAL | 1 refills | Status: DC
Start: 2021-09-28 — End: 2021-12-05

## 2021-09-28 NOTE — Telephone Encounter (Signed)
Pt needing refill on Nexium sent to [facility]. Refill sent; daughter informed and will let mom know

## 2021-09-29 ENCOUNTER — Encounter: Payer: Self-pay | Admitting: *Deleted

## 2021-09-29 ENCOUNTER — Telehealth: Payer: Self-pay | Admitting: *Deleted

## 2021-09-29 NOTE — Progress Notes (Signed)
Error opened under wrong practice

## 2021-09-29 NOTE — Chronic Care Management (AMB) (Signed)
  Care Management   Note  09/29/2021 Name: [name] MRN: [id number] DOB: [birth year]  [name] is a 80 y.o. year old female who is a primary care patient of Luking, Elayne Snare, MD and is actively engaged with the care management team. I reached out to [name] by phone today to assist with re-scheduling a follow up visit with the RN Case Manager  Follow up plan: Unsuccessful telephone outreach attempt made. A HIPAA compliant phone message was left for the patient providing contact information and requesting a return call.  The care management team will reach out to the patient again over the next 7 days.  If patient returns call to provider office, please advise to call [place] at [phone].  [place] Management  Direct Dial: [phone]

## 2021-09-29 NOTE — Chronic Care Management (AMB) (Signed)
  Care Management   Note  09/29/2021 Name: [name] MRN: [id number] DOB: 06-07-1941  [name] is a 80 y.o. year old female who is a primary care patient of Luking, Elayne Snare, MD and is actively engaged with the care management team. I reached out to [name] by phone today to assist with re-scheduling a follow up visit with the RN Case Manager  Follow up plan: Unsuccessful telephone outreach attempt made. A HIPAA compliant phone message was left for the patient providing contact information and requesting a return call.  The care management team will reach out to the patient again over the next 7 days.  If patient returns call to provider office, please advise to call [place]  at [place] Management  Direct Dial: [phone]

## 2021-10-06 NOTE — Chronic Care Management (AMB) (Signed)
°  Care Management   Note  10/06/2021 Name: [name] MRN: [id number] DOB: 1940-12-26  [name] is a 80 y.o. year old female who is a primary care patient of Luking, Elayne Snare, MD and is actively engaged with the care management team. I reached out to [name] by phone today to assist with re-scheduling a follow up visit with the RN Case Manager  Follow up plan: Unsuccessful telephone outreach attempt made. A HIPAA compliant phone message was left for the patient providing contact information and requesting a return call.  The care management team will reach out to the patient again over the next 7-14 days.  If patient returns call to provider office, please advise to call [place] at [phone].  [place] Management  Direct Dial: [phone]

## 2021-10-17 ENCOUNTER — Other Ambulatory Visit: Payer: Self-pay | Admitting: Family Medicine

## 2021-10-18 NOTE — Chronic Care Management (AMB) (Signed)
°  Care Management   Note  10/18/2021 Name: [name] MRN: [id number] DOB: April 15, 1941  [name] is a 80 y.o. year old female who is a primary care patient of Luking, Elayne Snare, MD and is actively engaged with the care management team. I reached out to [name] by phone today to assist with re-scheduling a follow up visit with the RN Case Manager  Follow up plan: A third unsuccessful telephone outreach attempt made. A HIPAA compliant phone message was left for the patient providing contact information and requesting a return call. Unable to make contact on outreach attempts x 3. PCP Dr. Wolfgang Phoenix. notified via routed documentation in medical record. We have been unable to make contact with the patient for follow up. The care management team is available to follow up with the patient after provider conversation with the patient regarding recommendation for care management engagement and subsequent re-referral to the care management team. If patient returns call to provider office, please advise to call [place] at [phone].  [place] Management  Direct Dial: [phone]

## 2021-10-19 ENCOUNTER — Ambulatory Visit: Payer: Medicare Other | Admitting: Family Medicine

## 2021-10-20 ENCOUNTER — Ambulatory Visit: Payer: Self-pay | Admitting: *Deleted

## 2021-10-20 DIAGNOSIS — E785 Hyperlipidemia, unspecified: Secondary | ICD-10-CM

## 2021-10-20 DIAGNOSIS — I10 Essential (primary) hypertension: Secondary | ICD-10-CM

## 2021-10-20 NOTE — Chronic Care Management (AMB) (Signed)
° °  10/20/2021  [name] 1941/07/23 [id number]   RN care manager received message from care guide on 10/18/21- 3 unsuccessful outreaches to reschedule patient.  Care plan updated/ resolved and case closed.  Jacqlyn Larsen [facility], BSN RN Case Manager [facility] [phone]

## 2021-11-04 ENCOUNTER — Telehealth: Payer: Self-pay | Admitting: Family Medicine

## 2021-11-04 NOTE — Telephone Encounter (Signed)
FYI-NP with [facility] calling to inform us that she saw patient today and noticed irregular heart rhythm. Pt is asymptomatic.

## 2021-11-05 NOTE — Telephone Encounter (Signed)
Nurses-when the patient comes next week I would want to have a EKG due to abnormal rhythm

## 2021-11-07 NOTE — Telephone Encounter (Signed)
Patient has appt scheduled with Dr Nicki Reaper 11/09/21

## 2021-11-09 ENCOUNTER — Ambulatory Visit (INDEPENDENT_AMBULATORY_CARE_PROVIDER_SITE_OTHER): Payer: Medicare Other | Admitting: Family Medicine

## 2021-11-09 ENCOUNTER — Other Ambulatory Visit: Payer: Self-pay

## 2021-11-09 ENCOUNTER — Encounter: Payer: Self-pay | Admitting: Family Medicine

## 2021-11-09 VITALS — BP 142/70 | HR 75 | Temp 97.3°F | Ht 68.0 in | Wt 195.0 lb

## 2021-11-09 DIAGNOSIS — L700 Acne vulgaris: Secondary | ICD-10-CM

## 2021-11-09 DIAGNOSIS — I491 Atrial premature depolarization: Secondary | ICD-10-CM | POA: Diagnosis not present

## 2021-11-09 DIAGNOSIS — R000 Tachycardia, unspecified: Secondary | ICD-10-CM | POA: Diagnosis not present

## 2021-11-09 DIAGNOSIS — I10 Essential (primary) hypertension: Secondary | ICD-10-CM | POA: Diagnosis not present

## 2021-11-09 DIAGNOSIS — I498 Other specified cardiac arrhythmias: Secondary | ICD-10-CM | POA: Diagnosis not present

## 2021-11-09 MED ORDER — AMLODIPINE BESYLATE 2.5 MG PO TABS: 2.5000 mg | ORAL_TABLET | Freq: Every day | ORAL | 3 refills | Status: DC

## 2021-11-09 NOTE — Progress Notes (Signed)
° °  Subjective:    Patient ID: [name], female    DOB: [birth year], 81 y.o.   MRN: [id number]  HPI Heart fluttering noticed on Friday , she stopped verapamil for 5 days Hypertension - ziac  She states home nurse came out to check to told her that she had an irregular heartbeat she states occasionally she feels a palpitation but none denies any type of chest pain shortness of breath denies swelling in the legs.  She did stop taking her verapamil.  Black heads on back - reoccurring  Review of Systems     Objective:   Physical Exam On physical exam she does have blackheads that have developed a summation that is noninfected but she states it causes her trouble in tenderness and she would like to have them removed  Listen to the heart regular rate and rhythm but I am hearing occasional skipped beats but I am not hearing any type of rhythm that would be consistent with atrial fib     Assessment & Plan:  Blackheads-see discussion above referral to dermatology  Palpitations with junctional beats normal sinus rhythm otherwise would benefit from seeing cardiology for further evaluation and telemetry and possibly additional medication for now stick with her current medicines patient educated about what to watch for  Check lab work Recheck him in 4 to 6 weeks to see how her pressure is doing

## 2021-11-09 NOTE — Patient Instructions (Signed)
Hi Dorrine It was good to see you today   as for the blackheads on your back we will help set you up with dermatology, Dr. Hall/Dr. Rozann Lesches office here in town  We will also help set you up with cardiology at the hospital cardiology clinic with [facility] more than likely they will have you wear a monitor  Please do your blood work  I will also see you back in 4 to 6 weeks to recheck your blood pressure  Follow-up sooner if any problems  Stop verapamil  Continue your other medication We will add amlodipine 2.5 mg 1 daily  TakeCare-Dr. Nicki Reaper

## 2021-11-10 LAB — BASIC METABOLIC PANEL
BUN/Creatinine Ratio: 19 (ref 12–28)
BUN: 16 mg/dL (ref 8–27)
CO2: 22 mmol/L (ref 20–29)
Calcium: 9.6 mg/dL (ref 8.7–10.3)
Chloride: 103 mmol/L (ref 96–106)
Creatinine, Ser: 0.83 mg/dL (ref 0.57–1.00)
Glucose: 92 mg/dL (ref 70–99)
Potassium: 4.1 mmol/L (ref 3.5–5.2)
Sodium: 141 mmol/L (ref 134–144)
eGFR: 70 mL/min/{1.73_m2} (ref >59)

## 2021-11-10 LAB — TSH+FREE T4
Free T4: 1.12 ng/dL (ref 0.82–1.77)
TSH: 4.07 u[IU]/mL (ref 0.450–4.500)

## 2021-11-10 LAB — MAGNESIUM: Magnesium: 2.3 mg/dL (ref 1.6–2.3)

## 2021-11-10 NOTE — Progress Notes (Signed)
LMTRC

## 2021-12-01 DIAGNOSIS — L720 Epidermal cyst: Secondary | ICD-10-CM | POA: Diagnosis not present

## 2021-12-05 ENCOUNTER — Other Ambulatory Visit: Payer: Self-pay

## 2021-12-05 MED ORDER — ESOMEPRAZOLE MAGNESIUM 40 MG PO CPDR
DELAYED_RELEASE_CAPSULE | ORAL | 1 refills | Status: AC
Start: 2021-12-05 — End: ?

## 2021-12-21 ENCOUNTER — Ambulatory Visit: Payer: Medicare Other | Admitting: Family Medicine

## 2021-12-28 ENCOUNTER — Other Ambulatory Visit: Payer: Self-pay

## 2021-12-28 ENCOUNTER — Ambulatory Visit (INDEPENDENT_AMBULATORY_CARE_PROVIDER_SITE_OTHER): Payer: Medicare Other | Admitting: Family Medicine

## 2021-12-28 VITALS — BP 160/84 | HR 76 | Temp 97.7°F | Ht 68.0 in | Wt 194.4 lb

## 2021-12-28 DIAGNOSIS — I10 Essential (primary) hypertension: Secondary | ICD-10-CM

## 2021-12-28 NOTE — Patient Instructions (Addendum)
Increase the dose of amlodipine to 5 mg ?That would be 2 at the same time ?Recheck BP next weds at 11:30 ?

## 2021-12-28 NOTE — Progress Notes (Signed)
? ?  Subjective:  ? ? Patient ID: [name], female    DOB: [birth year], 81 y.o.   MRN: [id number] ? ?HPI ? ?Patient here for follow up blood pressure ? ?Primary hypertension ?Healthy diet adherence to medications were discussed blood pressure moderately elevated today see discussion below ?Review of Systems ? ?   ?Objective:  ? Physical Exam ?General-in no acute distress ?Eyes-no discharge ?Lungs-respiratory rate normal, CTA ?CV-no murmurs,RRR ?Extremities skin warm dry no edema ?Neuro grossly normal ?Behavior normal, alert ? ? ? ? ?   ?Assessment & Plan:  ?1. Primary hypertension ?BP up today but did not take med ?Only on Amlodipine ? ?Recommend bump up does of amlodipine to 5 mg ?She will come by next week to recheck BP ?

## 2022-01-04 ENCOUNTER — Ambulatory Visit (INDEPENDENT_AMBULATORY_CARE_PROVIDER_SITE_OTHER): Payer: Medicare Other

## 2022-01-04 ENCOUNTER — Other Ambulatory Visit: Payer: Self-pay

## 2022-01-04 ENCOUNTER — Ambulatory Visit (INDEPENDENT_AMBULATORY_CARE_PROVIDER_SITE_OTHER): Payer: Medicare Other | Admitting: Family Medicine

## 2022-01-04 ENCOUNTER — Ambulatory Visit: Payer: Medicare Other | Admitting: Cardiology

## 2022-01-04 ENCOUNTER — Other Ambulatory Visit: Payer: Self-pay | Admitting: Cardiology

## 2022-01-04 ENCOUNTER — Encounter: Payer: Self-pay | Admitting: Family Medicine

## 2022-01-04 ENCOUNTER — Encounter: Payer: Self-pay | Admitting: Cardiology

## 2022-01-04 VITALS — BP 150/70 | Temp 98.2°F | Wt 192.4 lb

## 2022-01-04 VITALS — BP 120/64 | HR 62 | Ht 68.0 in | Wt 194.8 lb

## 2022-01-04 DIAGNOSIS — I493 Ventricular premature depolarization: Secondary | ICD-10-CM | POA: Diagnosis not present

## 2022-01-04 DIAGNOSIS — I491 Atrial premature depolarization: Secondary | ICD-10-CM

## 2022-01-04 DIAGNOSIS — I10 Essential (primary) hypertension: Secondary | ICD-10-CM

## 2022-01-04 MED ORDER — AMLODIPINE BESYLATE 2.5 MG PO TABS
7.5000 mg | ORAL_TABLET | Freq: Every day | ORAL | 1 refills | Status: DC
Start: 2022-01-04 — End: 2022-02-06

## 2022-01-04 NOTE — Patient Instructions (Addendum)
Testing/Procedures: ?Your physician has requested that you have an echocardiogram. Echocardiography is a painless test that uses sound waves to create images of your heart. It provides your doctor with information about the size and shape of your heart and how well your heart?s chambers and valves are working. This procedure takes approximately one hour. There are no restrictions for this procedure. ? ? ?Follow-Up: ?Follow up in 3-4 weeks with APP ? ?Any Other Special Instructions Will Be Listed Below (If Applicable). ? ? ? ? ?If you need a refill on your cardiac medications before your next appointment, please call your pharmacy. ? ?ZIO XT- Long Term Monitor Instructions  ? ?Your physician has requested you wear your ZIO patch monitor____14___days.  ? ?This is a single patch monitor.  [employer] supplies one patch monitor per enrollment.  Additional stickers are not available. ?  ?Please do not apply patch if you will be having a Nuclear Stress Test, Echocardiogram, Cardiac CT, MRI, or Chest Xray during the time frame you would be wearing the monitor. The patch cannot be worn during these tests.  You cannot remove and re-apply the ZIO XT patch monitor. ?  ?Your ZIO patch monitor will be sent USPS Priority mail from [facility] directly to your home address. The monitor may also be mailed to a PO BOX if home delivery is not available.   It may take 3-5 days to receive your monitor after you have been enrolled. ?  ?Once you have received you monitor, please review enclosed instructions.  Your monitor has already been registered assigning a specific monitor serial # to you. ?  ?Applying the monitor  ? ?Shave hair from upper left chest. ?  ?Hold abrader disc by orange tab.  Rub abrader in 40 strokes over left upper chest as indicated in your monitor instructions. ?  ?Clean area with 4 enclosed alcohol pads .  Use all pads to assure are is cleaned thoroughly.  Let dry.  ? ?Apply patch as indicated in monitor  instructions.  Patch will be place under collarbone on left side of chest with arrow pointing upward. ?  ?Rub patch adhesive wings for 2 minutes.Remove white label marked "1".  Remove white label marked "2".  Rub patch adhesive wings for 2 additional minutes. ?  ?While looking in a mirror, press and release button in center of patch.  A small green light will flash 3-4 times .  This will be your only indicator the monitor has been turned on. ?    ?Do not shower for the first 24 hours.  You may shower after the first 24 hours. ?  ?Press button if you feel a symptom. You will hear a small click.  Record Date, Time and Symptom in the Patient Log Book. ?  ?When you are ready to remove patch, follow instructions on last 2 pages of Patient Log Book.  Stick patch monitor onto last page of Patient Log Book. ?  ?Place Patient Log Book in [facility] box.  Use locking tab on box and tape box closed securely.  The [employer] and [employer] has [employer] on it.  Please place in mailbox as soon as possible.  Your physician should have your test results approximately 7 days after the monitor has been mailed back to [facility]. ?  ?Call [facility] at [phone] if you have questions regarding your ZIO XT patch monitor.  Call them immediately if you see an orange light blinking on your monitor. ?  ?  If your monitor falls off in less than 4 days contact our Monitor department at [phone].  If your monitor becomes loose or falls off after 4 days call [employer] at [phone] for suggestions on securing your monitor.   ?

## 2022-01-04 NOTE — Progress Notes (Signed)
? ?  Subjective:  ? ? Patient ID: [name], female    DOB: 1941/06/22, 81 y.o.   MRN: [id number] ? ?HPI ?Pt following up regarding blood pressure. Pt was in 12/28/21 and bp was elevated. Pt states blood pressure has been up and down but no other symptoms. Does get sinus headaches.  ?Blood pressure currently at 5 mg ?She is utilizing the 2.5 mg taken 2/day ?She denies any swelling in the ankles ?She denies any chest tightness pressure pain shortness of breath ? ?Review of Systems ? ?   ?Objective:  ? Physical Exam ? ?General-in no acute distress ?Eyes-no discharge ?Lungs-respiratory rate normal, CTA ?CV-no murmurs,RRR ?Extremities skin warm dry no edema ?Neuro grossly normal ?Behavior normal, alert ? ? ? ?   ?Assessment & Plan:  ?Blood pressure subpar control ?Bump up amlodipine 7.5 mg daily ?That would be the 2.5 mg tablet take 3 daily ?Follow-up in 4 weeks ? ?

## 2022-01-04 NOTE — Addendum Note (Signed)
Addended by: Christella Scheuermann C on: 01/04/2022 01:50 PM ? ? Modules accepted: Orders ? ?

## 2022-01-04 NOTE — Progress Notes (Signed)
? ?Cardiology CONSULT Note   ? ?Date:  01/04/2022  ? ?ID:  [name], DOB 1941-03-03, MRN [id number] ? ?PCP:  Kathyrn Drown, MD  ?Cardiologist:  Fransico Him, MD  ? ?Chief Complaint  ?Patient presents with  ? New Patient (Initial Visit)  ?  PVCs and PACs  ? ? ?History of Present Illness:  ?[name] is a 81 y.o. female who is being seen today for the evaluation of PACs at the request of Luking, Elayne Snare, MD. ? ?This is an 81 year old female with a history of allergies, hypertension, hyperlipidemia, thyroid disease with radiation in 57s who was referred for evaluation of PACs and PVCs.  Her home health nurse came by and noticed that she had any irregular heart beat.  She went in to see her PCP and EKG was done showing sinus bradycardia at 59 bpm with occasional PVCs and PACs. She has a history of remote 2D echo done in 2017 demonstrating normal LV function with a EF 60 to 65% with grade 1 diastolic dysfunction, mild MR and trivial TR.   She is now referred for further evaluation.  She denies any chest pain or shortness of breath, PND, orthopnea, lower extremity edema,  dizziness or syncope.  Occasionally she will notice a fluttering in her chest but no really bothersome. She drinks 1 cup of coffee daily and occasionally soda. ? ?Past Medical History:  ?Diagnosis Date  ? Allergy   ? Angioedema   ? Arthritis   ? GERD (gastroesophageal reflux disease)   ? Headache   ? Hx of echocardiogram 10/2005  ? normal  ? Hyperlipidemia   ? Hypertension   ? Thyroid disease   ? treated with radiation in 1980s   ? ? ?Past Surgical History:  ?Procedure Laterality Date  ? ABDOMINAL HYSTERECTOMY    ? CHOLECYSTECTOMY    ? COLONOSCOPY  06/15/2011  ? Procedure: COLONOSCOPY;  Surgeon: Rogene Houston, MD;  Location: [facility];  Service: Endoscopy;  Laterality: N/A;  ? COLONOSCOPY N/A 07/27/2016  ? rehman: tubular adenoma  ? ESOPHAGOGASTRODUODENOSCOPY N/A 12/08/2016  ? Procedure: ESOPHAGOGASTRODUODENOSCOPY (EGD);   Surgeon: Rogene Houston, MD;  Location: [facility];  Service: Endoscopy;  Laterality: N/A;  120  ? EYE SURGERY    ? removed cyst from right eye in june 2015. sept 9th surgery for eye lid drop on both eyes  ? JOINT REPLACEMENT    ? 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 TOTAL KNEE ARTHROPLASTY Left 12/27/2015  ? Procedure: LEFT TOTAL KNEE ARTHROPLASTY;  Surgeon: Gaynelle Arabian, MD;  Location: [facility];  Service: Orthopedics;  Laterality: Left;  ? TUBAL LIGATION    ? ? ?Current Medications: ?Current Meds  ?Medication Sig  ? amLODipine (NORVASC) 2.5 MG tablet Take 3 tablets (7.5 mg total) by mouth daily.  ? diphenhydrAMINE (BENADRYL) 25 MG tablet Take 25 mg by mouth every 6 (six) hours as needed for allergies (swelling).  ? esomeprazole (NEXIUM) 40 MG capsule TAKE ONE CAPSULE PO ONCE DAILY AS NEEDED FOR ACID REFLUX  ? RESTASIS MULTIDOSE 0.05 % ophthalmic emulsion Place 1 drop into both eyes 2 (two) times daily.   ? ? ?Allergies:   Other, Penicillins, Salmon [fish allergy], and Statins  ? ?Social History  ? ?Socioeconomic History  ? Marital status: Widowed  ?  Spouse name: Not on file  ? Number of children: 3  ? Years of education: Not on file  ? Highest education level: Not on file  ?  Occupational History  ? Not on file  ?Tobacco Use  ? Smoking status: Never  ? Smokeless tobacco: Never  ?Vaping Use  ? Vaping Use: Never used  ?Substance and Sexual Activity  ? Alcohol use: No  ? Drug use: No  ? Sexual activity: Not Currently  ?  Birth control/protection: Surgical  ?Other Topics Concern  ? Not on file  ?Social History Narrative  ? Worked at [employer] and [employer] before retired.   ? Works part-time.   ? Widow since 2017.  ? ?Social Determinants of Health  ? ?Financial Resource Strain: Low Risk   ? Difficulty of Paying Living Expenses: Not hard at all  ?Food Insecurity: No Food Insecurity  ? Worried About Charity fundraiser in the Last Year: Never true  ? Ran Out of Food in the Last Year: Never true   ?Transportation Needs: No Transportation Needs  ? Lack of Transportation (Medical): No  ? Lack of Transportation (Non-Medical): No  ?Physical Activity: Insufficiently Active  ? Days of Exercise per Week: 3 days  ? Minutes of Exercise per Session: 20 min  ?Stress: No Stress Concern Present  ? Feeling of Stress : Not at all  ?Social Connections: Moderately Integrated  ? Frequency of Communication with Friends and Family: More than three times a week  ? Frequency of Social Gatherings with Friends and Family: More than three times a week  ? Attends Religious Services: More than 4 times per year  ? Active Member of Clubs or Organizations: Yes  ? Attends Archivist Meetings: More than 4 times per year  ? Marital Status: Widowed  ?  ? ?Family History:  The patient's family history includes Cancer in her brother; Coronary artery disease in her father and mother; Diabetes in her brother; Heart failure in her sister; Hypertension in her father, mother, and sister.  ? ?ROS:   ?Please see the history of present illness.    ?ROS All other systems reviewed and are negative. ? ?No flowsheet data found. ? ? ? ? ?PHYSICAL EXAM:   ?VS:  BP 120/64   Pulse 62   Ht '5\' 8"'$  (1.727 m)   Wt 194 lb 12.8 oz (88.4 kg)   SpO2 98%   BMI 29.62 kg/m?    ?GEN: Well nourished, well developed, in no acute distress  ?HEENT: normal  ?Neck: no JVD, carotid bruits, or masses ?Cardiac: RRR; no murmurs, rubs, or gallops,no edema.  Intact distal pulses bilaterally.  ?Respiratory:  clear to auscultation bilaterally, normal work of breathing ?GI: soft, nontender, nondistended, + BS ?MS: no deformity or atrophy  ?Skin: warm and dry, no rash ?Neuro:  Alert and Oriented x 3, Strength and sensation are intact ?Psych: euthymic mood, full affect ? ?Wt Readings from Last 3 Encounters:  ?01/04/22 194 lb 12.8 oz (88.4 kg)  ?01/04/22 192 lb 6.4 oz (87.3 kg)  ?12/28/21 194 lb 6.4 oz (88.2 kg)  ?  ? ? ?Studies/Labs Reviewed:  ? ?EKG:  EKG is not ordered  today.   ? ?Recent Labs: ?04/01/2021: ALT 12 ?11/09/2021: BUN 16; Creatinine, Ser 0.83; Magnesium 2.3; Potassium 4.1; Sodium 141; TSH 4.070  ? ?Lipid Panel ?   ?Component Value Date/Time  ? CHOL 208 (H) 04/01/2021 0929  ? TRIG 131 04/01/2021 0929  ? HDL 34 (L) 04/01/2021 0929  ? CHOLHDL 6.1 (H) 04/01/2021 4827  ? CHOLHDL 4.0 01/29/2014 0830  ? VLDL 16 01/29/2014 0830  ? Peterman 150 (H) 04/01/2021 0929  ? ? ? ?  Additional studies/ records that were reviewed today include:  ?EKG, 2D echo from 2017 ? ? ? ?ASSESSMENT:   ? ?1. PVC's (premature ventricular contractions)   ?2. PAC (premature atrial contraction)   ?3. Primary hypertension   ? ? ? ?PLAN:  ?In order of problems listed above: ? ?PVCs ?-She is asymptomatic with these but are quite frequent on her last EKG ?-Check 2D echo to reassess LV function ?-Check 2 week Zio patch to assess PVC load ?-I have personally reviewed and interpreted outside labs performed by patient's PCP which showed serum creatinine 0.83, potassium 4.1 and magnesium 2.3 on 11/09/2021.  TSH was also done and was normal at 4.070 with free T4 1.12. ? ?2.  PACs ?-Noted on recent event monitor and fairly asymptomatic. ?-Encouraged her to limit caffeine intake. ? ?3.  HTN ?-BP is controlled on exam ?-continue prescription drug management with Amlodpine 7.'5mg'$  daily ?-Follow-up with extender in 2 to 3 weeks ? ?Time Spent: ?20 minutes total time of encounter, including 15 minutes spent in face-to-face patient care on the date of this encounter. This time includes coordination of care and counseling regarding above mentioned problem list. Remainder of non-face-to-face time involved reviewing chart documents/testing relevant to the patient encounter and documentation in the medical record. I have independently reviewed documentation from referring provider ? ?Medication Adjustments/Labs and Tests Ordered: ?Current medicines are reviewed at length with the patient today.  Concerns regarding medicines are  outlined above.  Medication changes, Labs and Tests ordered today are listed in the Patient Instructions below. ? ?There are no Patient Instructions on file for this visit. ? ? ?Signed, ?Fransico Him, MD  ?01/04/2022 1:34 PM    ?Larence Penning

## 2022-01-25 ENCOUNTER — Ambulatory Visit (HOSPITAL_COMMUNITY)
Admission: RE | Admit: 2022-01-25 | Discharge: 2022-01-25 | Disposition: A | Discharge status: Home / Self Care | Payer: Medicare Other | Source: Ambulatory Visit | Attending: Cardiology | Admitting: Cardiology

## 2022-01-25 ENCOUNTER — Encounter: Payer: Self-pay | Admitting: Cardiology

## 2022-01-25 DIAGNOSIS — E785 Hyperlipidemia, unspecified: Secondary | ICD-10-CM | POA: Diagnosis not present

## 2022-01-25 DIAGNOSIS — I493 Ventricular premature depolarization: Secondary | ICD-10-CM | POA: Diagnosis not present

## 2022-01-25 DIAGNOSIS — I10 Essential (primary) hypertension: Secondary | ICD-10-CM | POA: Insufficient documentation

## 2022-01-25 DIAGNOSIS — I340 Nonrheumatic mitral (valve) insufficiency: Secondary | ICD-10-CM | POA: Insufficient documentation

## 2022-01-25 LAB — ECHOCARDIOGRAM COMPLETE
AR max vel: 2.61 cm2
AV Area VTI: 2.77 cm2
AV Area mean vel: 2.17 cm2
AV Mean grad: 2 mmHg
AV Peak grad: 4.2 mmHg
Ao pk vel: 1.02 m/s
Area-P 1/2: 2.56 cm2
Calc EF: 68.9 %
MV VTI: 2.07 cm2
S' Lateral: 2.8 cm
Single Plane A2C EF: 70.1 %
Single Plane A4C EF: 67 %

## 2022-01-25 NOTE — Progress Notes (Signed)
*  PRELIMINARY RESULTS* ?Echocardiogram ?2D Echocardiogram has been performed. ? ?[name] ?01/25/2022, 1:55 PM ?

## 2022-01-31 ENCOUNTER — Encounter: Payer: Self-pay | Admitting: Cardiology

## 2022-01-31 DIAGNOSIS — I471 Supraventricular tachycardia: Secondary | ICD-10-CM | POA: Insufficient documentation

## 2022-01-31 DIAGNOSIS — I493 Ventricular premature depolarization: Secondary | ICD-10-CM | POA: Insufficient documentation

## 2022-02-04 ENCOUNTER — Other Ambulatory Visit: Payer: Self-pay | Admitting: Family Medicine

## 2022-02-08 ENCOUNTER — Ambulatory Visit (INDEPENDENT_AMBULATORY_CARE_PROVIDER_SITE_OTHER): Payer: Medicare Other | Admitting: Family Medicine

## 2022-02-08 VITALS — BP 132/68 | HR 58 | Temp 97.3°F | Ht 68.0 in | Wt 194.2 lb

## 2022-02-08 DIAGNOSIS — W57XXXD Bitten or stung by nonvenomous insect and other nonvenomous arthropods, subsequent encounter: Secondary | ICD-10-CM

## 2022-02-08 DIAGNOSIS — I10 Essential (primary) hypertension: Secondary | ICD-10-CM | POA: Diagnosis not present

## 2022-02-08 DIAGNOSIS — S30860D Insect bite (nonvenomous) of lower back and pelvis, subsequent encounter: Secondary | ICD-10-CM | POA: Diagnosis not present

## 2022-02-08 MED ORDER — TRIAMCINOLONE ACETONIDE 0.1 % EX CREA: 1.0000 "application " | TOPICAL_CREAM | Freq: Two times a day (BID) | CUTANEOUS | 0 refills | Status: DC

## 2022-02-08 NOTE — Progress Notes (Signed)
? ?  Subjective:  ? ? Patient ID: [name], female    DOB: 1941-09-13, 81 y.o.   MRN: [id number] ? ?HPI ? ?Patient here for follow up blood pressure ?She relates needing to follow-up regarding blood pressure ?Relates taking her medicine as directed ?Saw cardiology ?Telemetry pending ?Echo was done there and repeating the echo in 2 years time ?Did show some mitral valve regurgitation but ejection fraction was good ?Patient has an insect bite in lower part of back.  Itches intensely no drainage or pain other than blistered ?Review of Systems ? ?   ?Objective:  ? Physical Exam ?General-in no acute distress ?Eyes-no discharge ?Lungs-respiratory rate normal, CTA ?CV-no murmurs,RRR ?Extremities skin warm dry no edema ?Neuro grossly normal ?Behavior normal, alert ? ?No sign of any type of cellulitis on the back it does appear to be above by ? ? ?   ?Assessment & Plan:  ? ?May use steroid cream on the bug bite if the area becomes progressively worse follow-up immediately ? ?Blood pressure doing well on medicine follow-up by fall time ?Follow through with cardiology regarding telemetry ?

## 2022-03-14 ENCOUNTER — Other Ambulatory Visit: Payer: Self-pay | Admitting: Family Medicine

## 2022-03-15 ENCOUNTER — Ambulatory Visit: Payer: Medicare Other | Admitting: Physician Assistant

## 2022-03-15 DIAGNOSIS — I493 Ventricular premature depolarization: Secondary | ICD-10-CM

## 2022-03-15 DIAGNOSIS — I471 Supraventricular tachycardia: Secondary | ICD-10-CM

## 2022-03-15 DIAGNOSIS — I10 Essential (primary) hypertension: Secondary | ICD-10-CM

## 2022-04-12 ENCOUNTER — Other Ambulatory Visit: Payer: Self-pay | Admitting: Family Medicine

## 2022-07-05 ENCOUNTER — Ambulatory Visit (INDEPENDENT_AMBULATORY_CARE_PROVIDER_SITE_OTHER): Payer: Medicare Other | Admitting: Family Medicine

## 2022-07-05 ENCOUNTER — Encounter: Payer: Self-pay | Admitting: Family Medicine

## 2022-07-05 VITALS — BP 132/82 | Wt 194.2 lb

## 2022-07-05 DIAGNOSIS — I10 Essential (primary) hypertension: Secondary | ICD-10-CM | POA: Diagnosis not present

## 2022-07-05 DIAGNOSIS — Z1329 Encounter for screening for other suspected endocrine disorder: Secondary | ICD-10-CM | POA: Diagnosis not present

## 2022-07-05 DIAGNOSIS — R5383 Other fatigue: Secondary | ICD-10-CM

## 2022-07-05 DIAGNOSIS — Z23 Encounter for immunization: Secondary | ICD-10-CM

## 2022-07-05 DIAGNOSIS — E785 Hyperlipidemia, unspecified: Secondary | ICD-10-CM

## 2022-07-05 DIAGNOSIS — N3281 Overactive bladder: Secondary | ICD-10-CM

## 2022-07-05 NOTE — Progress Notes (Signed)
   Subjective:    Patient ID: [name], female    DOB: 11-16-1940, 81 y.o.   MRN: [id number]  HPI Pt arrives for follow up on blood pressure. Pt states blood pressure has been doing well; no issues.   Pt reports ankle swelling; happened after riding for about 3 hours.   Pt also reports frequency at night. Doesn't drink a lot especially after 7 p.m.; gets up around 3 times at night to urinate.  Primary hypertension - Plan: Urinalysis, Lipid panel, Basic metabolic panel  Hyperlipidemia, unspecified hyperlipidemia type - Plan: Urinalysis, Lipid panel, Basic metabolic panel  Overactive bladder - Plan: Urinalysis, Lipid panel, Basic metabolic panel  Need for vaccination - Plan: Urinalysis, Lipid panel, Basic metabolic panel, Pneumococcal conjugate vaccine 20-valent (Prevnar 20), Flu Vaccine QUAD High Dose(Fluad) She tries to eat healthy and tries to do some walking minimizes salt stays physically active   Review of Systems     Objective:   Physical Exam  General-in no acute distress Eyes-no discharge Lungs-respiratory rate normal, CTA CV-no murmurs,RRR Extremities skin warm dry no edema Neuro grossly normal Behavior normal, alert Extremities no edema      Assessment & Plan:   1. Primary hypertension Blood pressure good control check met 7 the look at GFR - Urinalysis - Lipid panel - Basic metabolic panel  2. Hyperlipidemia, unspecified hyperlipidemia type Check lipid profile.  We will look at risk factors to determine if she needs to consider statins - Urinalysis - Lipid panel - Basic metabolic panel  3. Overactive bladder Patient defers on medications for this we will check urine to make sure there is no hematuria she prefers to get this through the lab - Urinalysis - Lipid panel - Basic metabolic panel  4. Need for vaccination Vaccine today - Urinalysis - Lipid panel - Basic metabolic panel - Pneumococcal conjugate vaccine 20-valent (Prevnar 20) -  Flu Vaccine QUAD High Dose(Fluad)  Intermittent pedal edema more so related to having been inactive for several hours while riding so therefore the best way to combat this is to use knee-high surgical support hose no sign of CHF on physical exam

## 2022-07-05 NOTE — Patient Instructions (Signed)
Covid vaccine and RSV vaccine through the pharmacy

## 2022-07-12 ENCOUNTER — Ambulatory Visit: Payer: Self-pay | Admitting: Family Medicine

## 2022-07-14 DIAGNOSIS — E785 Hyperlipidemia, unspecified: Secondary | ICD-10-CM | POA: Diagnosis not present

## 2022-07-14 DIAGNOSIS — Z23 Encounter for immunization: Secondary | ICD-10-CM | POA: Diagnosis not present

## 2022-07-14 DIAGNOSIS — I10 Essential (primary) hypertension: Secondary | ICD-10-CM | POA: Diagnosis not present

## 2022-07-14 DIAGNOSIS — N3281 Overactive bladder: Secondary | ICD-10-CM | POA: Diagnosis not present

## 2022-07-17 LAB — URINALYSIS

## 2022-07-17 LAB — BASIC METABOLIC PANEL
BUN/Creatinine Ratio: 18 (ref 12–28)
BUN: 17 mg/dL (ref 8–27)
CO2: 20 mmol/L (ref 20–29)
Calcium: 9.4 mg/dL (ref 8.7–10.3)
Chloride: 103 mmol/L (ref 96–106)
Creatinine, Ser: 0.93 mg/dL (ref 0.57–1.00)
Glucose: 92 mg/dL (ref 70–99)
Potassium: 4 mmol/L (ref 3.5–5.2)
Sodium: 141 mmol/L (ref 134–144)
eGFR: 61 mL/min/{1.73_m2} (ref >59)

## 2022-07-17 LAB — LIPID PANEL
Chol/HDL Ratio: 4.6 ratio — ABNORMAL HIGH (ref 0.0–4.4)
Cholesterol, Total: 203 mg/dL — ABNORMAL HIGH (ref 100–199)
HDL: 44 mg/dL (ref >39)
LDL Chol Calc (NIH): 141 mg/dL — ABNORMAL HIGH (ref 0–99)
Triglycerides: 98 mg/dL (ref 0–149)
VLDL Cholesterol Cal: 18 mg/dL (ref 5–40)

## 2022-07-17 LAB — SPECIMEN STATUS REPORT

## 2022-07-31 NOTE — Addendum Note (Signed)
Addended by: Dairl Ponder on: 07/31/2022 04:48 PM   Modules accepted: Orders

## 2022-08-30 ENCOUNTER — Ambulatory Visit (INDEPENDENT_AMBULATORY_CARE_PROVIDER_SITE_OTHER): Payer: Medicare Other | Admitting: Nurse Practitioner

## 2022-08-30 ENCOUNTER — Encounter: Payer: Self-pay | Admitting: Nurse Practitioner

## 2022-08-30 VITALS — BP 131/76 | Temp 98.5°F | Ht 66.5 in | Wt 193.2 lb

## 2022-08-30 DIAGNOSIS — N644 Mastodynia: Secondary | ICD-10-CM | POA: Diagnosis not present

## 2022-08-30 DIAGNOSIS — Z0000 Encounter for general adult medical examination without abnormal findings: Secondary | ICD-10-CM | POA: Diagnosis not present

## 2022-08-30 MED ORDER — MUPIROCIN 2 % EX OINT: 1.0000 | TOPICAL_OINTMENT | Freq: Two times a day (BID) | CUTANEOUS | 0 refills | Status: DC

## 2022-08-30 NOTE — Patient Instructions (Addendum)
Thank you for coming for your annual wellness visit.  Please follow through on any advice that was given to you by today's visit. Remember to maintain compliance with your medications as discussed today.  Also remember it is important to eat a healthy diet and to stay physically active on a daily basis.  Please follow through with any testing or recommended followup office visits as was discussed today. You are due the following test coming up:  Next Colonoscopy- Patient declines.   Vaccines- Up to date  Females- Next Mammogram- not indicated due to age   Next Bone Density- Not indicated due to age        Finally remembered that the annual wellness visit does not take the place of regularly scheduled office visits  chronic health problems such as hypertension/diabetes/cholesterol visits.

## 2022-08-30 NOTE — Progress Notes (Signed)
   Subjective:    Patient ID: [name], female    DOB: 01/24/1941, 81 y.o.   MRN: [id number]  HPI   AWV- Annual Wellness Visit  The patient was seen for their annual wellness visit. The patient's past medical history, surgical history, and family history were reviewed.  The patient's medication list was reviewed and updated.  The height and weight were entered.  BMI recorded in electronic record elsewhere   Falls /depression screening electronically recorded within record elsewhere. Patient denies any recent falls.  Current tobacco usage:no (All patients who use tobacco were given written and verbal information on quitting)  Recent listing of emergency department/hospitalizations over the past year were reviewed.  Current specialist the patient sees on a regular basis: none   Medicare annual wellness visit patient questionnaire was reviewed.  A written screening schedule for the patient for the next 5-10 years was given. Appropriate discussion of followup regarding next visit was discussed.   Patient states she has a sticking sensation in right nipple- though she stuck it with briar but still bothering her  Review of Systems  Constitutional:        Right nipple soreness  All other systems reviewed and are negative.      Objective:   Physical Exam Vitals reviewed.  Constitutional:      General: She is not in acute distress.    Appearance: Normal appearance. She is normal weight. She is not ill-appearing, toxic-appearing or diaphoretic.  HENT:     Head: Normocephalic and atraumatic.  Cardiovascular:     Rate and Rhythm: Normal rate and regular rhythm.     Pulses: Normal pulses.     Heart sounds: Normal heart sounds. No murmur heard. Pulmonary:     Effort: Pulmonary effort is normal. No respiratory distress.     Breath sounds: Normal breath sounds. No wheezing.  Chest:     Comments: Small black splinter noted to right nipple. No redness, swelling, or  purulent drainage.  Musculoskeletal:     Comments: Grossly intact  Skin:    General: Skin is warm.     Capillary Refill: Capillary refill takes less than 2 seconds.  Neurological:     Mental Status: She is alert.     Comments: Grossly intact  Psychiatric:        Mood and Affect: Mood normal.        Behavior: Behavior normal.           Assessment & Plan:   1. Encounter for subsequent annual wellness visit (AWV) in Medicare patient Adult wellness-complete.wellness physical was conducted today. Importance of diet and exercise were discussed in detail.  Importance of stress reduction and healthy living were discussed.  In addition to this a discussion regarding safety was also covered.  We also reviewed over immunizations and gave recommendations regarding current immunization needed for age.   In addition to this additional areas were also touched on including: Preventative health exams needed:  Colonoscopy recommended due to history and high risks. However patient declined. COVID Vax #4 up to date Shingles vaccine up to date per patient Tetnanus vaccine up to date   Patient was advised yearly wellness exam    2. Nipple pain - black splinter removed with tweezers without difficulty. No signs of infection noted.  - May apply Bactroban ointment as needed - RTC if symptoms return.

## 2022-09-06 ENCOUNTER — Encounter: Payer: Self-pay | Admitting: Nurse Practitioner

## 2022-10-13 ENCOUNTER — Ambulatory Visit (INDEPENDENT_AMBULATORY_CARE_PROVIDER_SITE_OTHER): Payer: Medicare Other | Admitting: Family Medicine

## 2022-10-13 ENCOUNTER — Encounter: Payer: Self-pay | Admitting: Family Medicine

## 2022-10-13 VITALS — BP 154/68 | HR 72 | Temp 98.6°F | Wt 196.4 lb

## 2022-10-13 DIAGNOSIS — R059 Cough, unspecified: Secondary | ICD-10-CM | POA: Diagnosis not present

## 2022-10-13 DIAGNOSIS — U071 COVID-19: Secondary | ICD-10-CM

## 2022-10-13 DIAGNOSIS — R6889 Other general symptoms and signs: Secondary | ICD-10-CM | POA: Diagnosis not present

## 2022-10-13 MED ORDER — PROMETHAZINE-DM 6.25-15 MG/5ML PO SYRP: 5.0000 mL | ORAL_SOLUTION | Freq: Four times a day (QID) | ORAL | 0 refills | Status: DC | PRN

## 2022-10-13 NOTE — Patient Instructions (Signed)
Rest.  Lots of fluids.  Medication as needed for cough.  Awaiting swab results.  Take care  Dr. Lacinda Axon

## 2022-10-13 NOTE — Progress Notes (Signed)
Subjective:  Patient ID: [name], female    DOB: November 29, 1940  Age: 81 y.o. MRN: [id number]  CC: Chief Complaint  Patient presents with   Cough    Cough and chest congestion. Alka Seltzer did help cough    HPI:  81 year old female presents for evaluation of the above.  Symptoms started Wed. She reports cough and chest congestion. No fever. Has been taking OTC Alka seltzer without resolution.  No SOB. No other complaints at this time.   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   Allergic reaction 05/13/2013   Hyperlipemia 04/24/2013   Hypertension 04/24/2013    Social Hx   Social History   Socioeconomic History   Marital status: Widowed    Spouse name: Not on file   Number of children: 3   Years of education: Not on file   Highest education level: Not on file  Occupational History   Not on file  Tobacco Use   Smoking status: Never   Smokeless tobacco: Never  Vaping Use   Vaping Use: Never used  Substance and Sexual Activity   Alcohol use: No   Drug use: No   Sexual activity: Not Currently    Birth control/protection: Surgical  Other Topics Concern   Not on file  Social History Narrative   Worked at [employer] and [employer] before retired.    Works part-time.    Widow since 2017.   Social Determinants of Health   Financial Resource Strain: Low Risk  (07/26/2021)   Overall Financial Resource Strain (CARDIA)    Difficulty of Paying Living Expenses: Not hard at all  Food Insecurity: No Food Insecurity (07/26/2021)   Hunger Vital Sign    Worried  About Running Out of Food in the Last Year: Never true    Ran Out of Food in the Last Year: Never true  Transportation Needs: No Transportation Needs (07/26/2021)   PRAPARE - Hydrologist (Medical): No    Lack of Transportation (Non-Medical): No  Physical Activity: Insufficiently Active (07/26/2021)   Exercise Vital Sign    Days of Exercise per Week: 3 days    Minutes of Exercise per Session: 20 min  Stress: No Stress Concern Present (07/26/2021)   [place]    Feeling of Stress : Not at all  Social Connections: Moderately Integrated (07/26/2021)   Social Connection and Isolation Panel [NHANES]    Frequency of Communication with Friends and Family: More than three times a week    Frequency of Social Gatherings with Friends and Family: More than three times a week    Attends Religious Services: More than 4 times per year    Active Member of [employer] or Organizations: Yes    Attends Archivist Meetings: More than 4 times per year    Marital Status: Widowed    Review of Systems Per HPI  Objective:  BP (!) 154/68   Pulse 72   Temp 98.6 F (37 C)   Wt 196 lb 6.4 oz (89.1 kg)   SpO2 100%  BMI 31.22 kg/m      10/13/2022    3:02 PM 10/13/2022    2:44 PM 08/30/2022   11:17 AM  BP/Weight  Systolic BP 160 109 323  Diastolic BP 68 53 76  Wt. (Lbs)  196.4   BMI  31.22 kg/m2     Physical Exam Vitals and nursing note reviewed.  Constitutional:      General: She is not in acute distress.    Appearance: Normal appearance.  HENT:     Head: Normocephalic and atraumatic.     Mouth/Throat:     Pharynx: Oropharynx is clear.  Eyes:     General:        Right eye: No discharge.        Left eye: No discharge.     Conjunctiva/sclera: Conjunctivae normal.  Cardiovascular:     Rate and Rhythm: Normal rate and regular rhythm.  Pulmonary:     Effort: Pulmonary effort is normal.      Breath sounds: Normal breath sounds. No wheezing or rales.  Neurological:     Mental Status: She is alert.     Lab Results  Component Value Date   WBC 6.0 08/20/2018   HGB 12.0 12/06/2020   HCT 44.8 08/20/2018   PLT 209 08/20/2018   GLUCOSE 92 07/14/2022   CHOL 203 (H) 07/14/2022   TRIG 98 07/14/2022   HDL 44 07/14/2022   LDLCALC 141 (H) 07/14/2022   ALT 12 04/01/2021   AST 15 04/01/2021   NA 141 07/14/2022   K 4.0 07/14/2022   CL 103 07/14/2022   CREATININE 0.93 07/14/2022   BUN 17 07/14/2022   CO2 20 07/14/2022   TSH 4.070 11/09/2021   INR 0.97 12/21/2015     Assessment & Plan:   Problem List Items Addressed This Visit       Other   COVID - Primary    Testing returned positive for COVID. Result went to PCP and patient started on Paxlovid. Promethazine DM for cough.       Other Visit Diagnoses     Cough, unspecified type       Relevant Orders   COVID-19, Flu A+B and RSV (Completed)       Meds ordered this encounter  Medications   promethazine-dextromethorphan (PROMETHAZINE-DM) 6.25-15 MG/5ML syrup    Sig: Take 5 mLs by mouth 4 (four) times daily as needed for cough.    Dispense:  118 mL    Refill:  0    Follow-up:  Return if symptoms worsen or fail to improve.  [place]

## 2022-10-14 LAB — SPECIMEN STATUS REPORT

## 2022-10-14 LAB — COVID-19, FLU A+B AND RSV
Influenza A, NAA: NOT DETECTED
Influenza B, NAA: NOT DETECTED
RSV, NAA: NOT DETECTED
SARS-CoV-2, NAA: DETECTED — AB

## 2022-10-15 ENCOUNTER — Other Ambulatory Visit: Payer: Self-pay | Admitting: Family Medicine

## 2022-10-15 DIAGNOSIS — U071 COVID-19: Secondary | ICD-10-CM | POA: Insufficient documentation

## 2022-10-15 MED ORDER — NIRMATRELVIR/RITONAVIR (PAXLOVID) TABLET (RENAL DOSING): 2.0000 | ORAL_TABLET | Freq: Two times a day (BID) | ORAL | 0 refills | Status: AC

## 2022-10-15 NOTE — Assessment & Plan Note (Signed)
Testing returned positive for COVID. Result went to PCP and patient started on Paxlovid. Promethazine DM for cough.

## 2023-01-02 ENCOUNTER — Other Ambulatory Visit: Payer: Self-pay | Admitting: Family Medicine

## 2023-01-05 ENCOUNTER — Ambulatory Visit: Payer: Medicare Other | Admitting: Family Medicine

## 2023-01-17 ENCOUNTER — Encounter: Payer: Self-pay | Admitting: Family Medicine

## 2023-01-17 ENCOUNTER — Ambulatory Visit (INDEPENDENT_AMBULATORY_CARE_PROVIDER_SITE_OTHER): Payer: Medicare Other | Admitting: Family Medicine

## 2023-01-17 VITALS — BP 132/84 | HR 70 | Ht 66.5 in | Wt 194.0 lb

## 2023-01-17 DIAGNOSIS — E785 Hyperlipidemia, unspecified: Secondary | ICD-10-CM | POA: Diagnosis not present

## 2023-01-17 DIAGNOSIS — Z13820 Encounter for screening for osteoporosis: Secondary | ICD-10-CM

## 2023-01-17 DIAGNOSIS — Z1329 Encounter for screening for other suspected endocrine disorder: Secondary | ICD-10-CM

## 2023-01-17 DIAGNOSIS — R5383 Other fatigue: Secondary | ICD-10-CM

## 2023-01-17 DIAGNOSIS — I10 Essential (primary) hypertension: Secondary | ICD-10-CM | POA: Diagnosis not present

## 2023-01-17 DIAGNOSIS — Z780 Asymptomatic menopausal state: Secondary | ICD-10-CM

## 2023-01-17 NOTE — Progress Notes (Signed)
   Subjective:    Patient ID: [name], female    DOB: July 30, 1941, 82 y.o.   MRN: [id number]  HPI Patient arrives today for 6 month follow up HTN. Patient states no concerns or issues.  Primary hypertension - Plan: Basic Metabolic Panel (7), Microalbumin/Creatinine Ratio, Urine  Hyperlipidemia, unspecified hyperlipidemia type - Plan: Lipid panel  Other fatigue  Screening for thyroid disorder - Plan: TSH + free T4  Encounter for osteoporosis screening in asymptomatic postmenopausal patient - Plan: DG Bone Density  Patient here for follow-up regarding cholesterol.    Patient relates taking medication on a regular basis Denies problems with medication Importance of dietary measures discussed Regular lab work regarding lipid and liver was checked and if needing additional labs was appropriately ordered  Patient for blood pressure check up.  The patient does have hypertension.   Patient relates dietary measures try to minimize salt The importance of healthy diet and activity were discussed Patient relates compliance She minimizes salt Stays active Works 2 days a week  Intermittent heartburn issues takes Nexium on a as needed basis that seems to help the most for denies any problems with it.  No dysphagia  We did discuss ordering labs today Review of Systems     Objective:   Physical Exam General-in no acute distress Eyes-no discharge Lungs-respiratory rate normal, CTA CV-no murmurs,RRR Extremities skin warm dry no edema Neuro grossly normal Behavior normal, alert        Assessment & Plan:  1. Primary hypertension HTN- patient seen for follow-up regarding HTN.   Diet, medication compliance, appropriate labs and refills were completed.   Importance of keeping blood pressure under good control to lessen the risk of complications discussed Regular follow-up visits discussed  - Basic Metabolic Panel (7) - Microalbumin/Creatinine Ratio, Urine  2.  Hyperlipidemia, unspecified hyperlipidemia type Hyperlipidemia-importance of diet, weight control, activity, compliance with medications discussed.   Recent labs reviewed.   Any additional labs or refills ordered.   Importance of keeping under good control discussed. Regular follow-up visits discussed  - Lipid panel  3. Other fatigue Mild fatigue  4. Screening for thyroid disorder Screening thyroid - TSH + free T4  5. Encounter for osteoporosis screening in asymptomatic postmenopausal patient Bone density - DG Bone Density

## 2023-01-26 ENCOUNTER — Other Ambulatory Visit (HOSPITAL_COMMUNITY): Payer: Medicare Other

## 2023-01-26 ENCOUNTER — Other Ambulatory Visit (HOSPITAL_COMMUNITY)
Admission: RE | Admit: 2023-01-26 | Discharge: 2023-01-26 | Disposition: A | Discharge status: Home / Self Care | Payer: Medicare Other | Source: Ambulatory Visit | Attending: Family Medicine | Admitting: Family Medicine

## 2023-01-26 ENCOUNTER — Ambulatory Visit (HOSPITAL_COMMUNITY)
Admission: RE | Admit: 2023-01-26 | Discharge: 2023-01-26 | Disposition: A | Discharge status: Home / Self Care | Payer: Medicare Other | Source: Ambulatory Visit | Attending: Family Medicine | Admitting: Family Medicine

## 2023-01-26 DIAGNOSIS — Z13820 Encounter for screening for osteoporosis: Secondary | ICD-10-CM | POA: Diagnosis not present

## 2023-01-26 DIAGNOSIS — Z780 Asymptomatic menopausal state: Secondary | ICD-10-CM | POA: Insufficient documentation

## 2023-01-26 DIAGNOSIS — E785 Hyperlipidemia, unspecified: Secondary | ICD-10-CM | POA: Diagnosis not present

## 2023-01-26 DIAGNOSIS — I10 Essential (primary) hypertension: Secondary | ICD-10-CM | POA: Diagnosis not present

## 2023-01-26 DIAGNOSIS — Z1329 Encounter for screening for other suspected endocrine disorder: Secondary | ICD-10-CM | POA: Diagnosis not present

## 2023-01-27 ENCOUNTER — Encounter: Payer: Self-pay | Admitting: Family Medicine

## 2023-01-27 LAB — MICROALBUMIN / CREATININE URINE RATIO
Creatinine, Urine: 150 mg/dL
Microalb/Creat Ratio: 5 mg/g creat (ref 0–29)
Microalbumin, Urine: 7.5 ug/mL

## 2023-01-27 LAB — TSH+FREE T4
Free T4: 1.3 ng/dL (ref 0.82–1.77)
TSH: 5.12 u[IU]/mL — ABNORMAL HIGH (ref 0.450–4.500)

## 2023-01-27 LAB — LIPID PANEL
Chol/HDL Ratio: 4.7 ratio — ABNORMAL HIGH (ref 0.0–4.4)
Cholesterol, Total: 206 mg/dL — ABNORMAL HIGH (ref 100–199)
HDL: 44 mg/dL (ref >39)
LDL Chol Calc (NIH): 139 mg/dL — ABNORMAL HIGH (ref 0–99)
Triglycerides: 127 mg/dL (ref 0–149)
VLDL Cholesterol Cal: 23 mg/dL (ref 5–40)

## 2023-01-27 LAB — BASIC METABOLIC PANEL (7)
BUN/Creatinine Ratio: 18 (ref 12–28)
BUN: 16 mg/dL (ref 8–27)
CO2: 21 mmol/L (ref 20–29)
Chloride: 104 mmol/L (ref 96–106)
Creatinine, Ser: 0.89 mg/dL (ref 0.57–1.00)
Glucose: 88 mg/dL (ref 70–99)
Potassium: 4.4 mmol/L (ref 3.5–5.2)
Sodium: 141 mmol/L (ref 134–144)
eGFR: 64 mL/min/{1.73_m2} (ref >59)

## 2023-01-27 NOTE — Progress Notes (Signed)
Please mail to the patient thank you 

## 2023-01-30 ENCOUNTER — Encounter: Payer: Self-pay | Admitting: Family Medicine

## 2023-01-30 NOTE — Progress Notes (Signed)
Please mail to the patient 

## 2023-03-01 ENCOUNTER — Telehealth: Payer: Self-pay

## 2023-03-01 NOTE — Telephone Encounter (Signed)
Prescription Request  03/01/2023  LOV: Visit date not found  What is the name of the medication or equipment? amLODipine (NORVASC) 2.5 MG tablet   Have you contacted your pharmacy to request a refill? Yes   Which pharmacy would you like this sent to?  [facility]   Patient notified that their request is being sent to the clinical staff for review and that they should receive a response within 2 business days.   Please advise at Mobile [phone] (mobile)

## 2023-03-02 MED ORDER — AMLODIPINE BESYLATE 2.5 MG PO TABS: 7.5000 mg | ORAL_TABLET | Freq: Every day | ORAL | 0 refills | Status: DC

## 2023-03-02 NOTE — Addendum Note (Signed)
Addended by: Margaretha Sheffield on: 03/02/2023 01:23 PM   Modules accepted: Orders

## 2023-03-02 NOTE — Telephone Encounter (Signed)
Received via fax Rx request: Prescription sent electronically to pharmacy  

## 2023-05-10 ENCOUNTER — Other Ambulatory Visit: Payer: Self-pay | Admitting: Family Medicine

## 2023-06-18 ENCOUNTER — Telehealth: Payer: Self-pay

## 2023-06-18 ENCOUNTER — Other Ambulatory Visit: Payer: Self-pay | Admitting: Nurse Practitioner

## 2023-06-18 MED ORDER — AMLODIPINE BESYLATE 2.5 MG PO TABS: 7.5000 mg | ORAL_TABLET | Freq: Every day | ORAL | 2 refills | Status: DC

## 2023-06-18 NOTE — Telephone Encounter (Signed)
Done

## 2023-06-18 NOTE — Telephone Encounter (Signed)
Prescription Request  06/18/2023  LOV: Visit date not found  What is the name of the medication or equipment? amLODipine (NORVASC) 2.5 MG tablet   Have you contacted your pharmacy to request a refill? Yes   Which pharmacy would you like this sent to? [facility]   Patient notified that their request is being sent to the clinical staff for review and that they should receive a response within 2 business days.   Please advise at Mobile [phone] (mobile)

## 2023-07-20 ENCOUNTER — Ambulatory Visit (INDEPENDENT_AMBULATORY_CARE_PROVIDER_SITE_OTHER): Payer: Medicare Other | Admitting: Family Medicine

## 2023-07-20 VITALS — BP 138/72 | HR 82 | Temp 98.1°F | Ht 66.5 in | Wt 190.4 lb

## 2023-07-20 DIAGNOSIS — I10 Essential (primary) hypertension: Secondary | ICD-10-CM | POA: Diagnosis not present

## 2023-07-20 DIAGNOSIS — R7989 Other specified abnormal findings of blood chemistry: Secondary | ICD-10-CM

## 2023-07-20 DIAGNOSIS — Z23 Encounter for immunization: Secondary | ICD-10-CM | POA: Diagnosis not present

## 2023-07-20 NOTE — Progress Notes (Signed)
   Subjective:    Patient ID: [name], female    DOB: [birth year], 82 y.o.   MRN: [id number]  Discussed the use of AI scribe software for clinical note transcription with the patient, who gave verbal consent to proceed.  History of Present Illness   The patient, [name], with a history of respiratory issues, presents with no significant changes in health status. She reports feeling "good" on most days and maintains an active lifestyle, working part-time two days a week and volunteering for [employer] on [employer]. She also engages in household chores on non-working days.  Her energy levels are generally good, but she notes that her breathing can be affected by weather conditions, particularly humidity. She describes having to take "a lot of breaths" during humid weather. This is a long-standing issue, previously referred to as "hay fever," which typically flares up in the spring and fall. However, she reports that her breathing is currently better.  From a nutritional standpoint, she reports a diet rich in fruits and vegetables, even occasionally consuming vegetables for breakfast. Her bowel movements are regular, and her mood is stable. She denies any recent falls.  She reports occasional palpitations, described as feeling like an "extra beat" every now and then. She acknowledges a previous EKG from a year ago that showed occasional PVCs. She has not experienced any episodes of rapid, uncontrolled heartbeats.  She also reports a slightly elevated thyroid test from a previous blood work done in April, but the other thyroid test was normal. She is scheduled for a follow-up blood work to recheck the thyroid levels. She has received her flu shot for the season.         Review of Systems     Objective:    Physical Exam   VITALS: BP- 134/68 CHEST: Lungs clear to auscultation. CARDIOVASCULAR: Heart rhythm regular with occasional skipped beats. EXTREMITIES: No swelling in legs.            Assessment & Plan:  Assessment and Plan    Seasonal Allergies Symptoms improved. No current distress. -Continue current management.  Cardiac Arrhythmia Occasional palpitations. Previous EKG showed occasional PVCs. No current distress. -Continue current management. -Advised to seek medical attention if experiencing persistent rapid heart rate.  Thyroid Function Slightly elevated thyroid test in April 2024. -Order repeat thyroid function tests in October 2024.  General Health Maintenance -Flu vaccine administered. -Return for follow-up in six months.      Blood pressure overall doing well but elevated when she first came in better on recheck Follow-up within 6 months sooner if any problems

## 2023-07-21 LAB — BASIC METABOLIC PANEL
BUN/Creatinine Ratio: 14 (ref 12–28)
BUN: 14 mg/dL (ref 8–27)
CO2: 22 mmol/L (ref 20–29)
Calcium: 9.2 mg/dL (ref 8.7–10.3)
Chloride: 103 mmol/L (ref 96–106)
Creatinine, Ser: 0.98 mg/dL (ref 0.57–1.00)
Glucose: 72 mg/dL (ref 70–99)
Potassium: 4.4 mmol/L (ref 3.5–5.2)
Sodium: 142 mmol/L (ref 134–144)
eGFR: 57 mL/min/{1.73_m2} — ABNORMAL LOW (ref >59)

## 2023-07-21 LAB — TSH+FREE T4
Free T4: 1.23 ng/dL (ref 0.82–1.77)
TSH: 3.69 u[IU]/mL (ref 0.450–4.500)

## 2023-07-22 ENCOUNTER — Encounter: Payer: Self-pay | Admitting: Family Medicine

## 2023-07-22 NOTE — Progress Notes (Signed)
Please mail to patient

## 2023-10-10 DIAGNOSIS — H04123 Dry eye syndrome of bilateral lacrimal glands: Secondary | ICD-10-CM | POA: Diagnosis not present

## 2023-12-21 ENCOUNTER — Ambulatory Visit (INDEPENDENT_AMBULATORY_CARE_PROVIDER_SITE_OTHER): Payer: Medicare Other

## 2023-12-21 VITALS — BP 137/72 | Ht 68.0 in | Wt 194.0 lb

## 2023-12-21 DIAGNOSIS — Z0100 Encounter for examination of eyes and vision without abnormal findings: Secondary | ICD-10-CM

## 2023-12-21 DIAGNOSIS — Z0000 Encounter for general adult medical examination without abnormal findings: Secondary | ICD-10-CM

## 2023-12-21 NOTE — Patient Instructions (Signed)
 Ms. Faw , Thank you for taking time to come for your Medicare Wellness Visit. I appreciate your ongoing commitment to your health goals. Please review the following plan we discussed and let me know if I can assist you in the future.   Referrals/Orders/Follow-Ups/Clinician Recommendations:  Next Medicare Annual Wellness Visit:   December 26, 2024 at 10:40 am video visit  You have been referred to establish care with a new eye care provider. If you have not heard from them in the next week, please call to schedule your appointment  [facility] [address] [place] [address] Ph [phone]   This is a list of the screening recommended for you and due dates:  Health Maintenance  Topic Date Due   Colon Cancer Screening  07/27/2021   COVID-19 Vaccine (5 - 2024-25 season) 06/24/2023   Medicare Annual Wellness Visit  12/20/2024   DEXA scan (bone density measurement)  01/25/2025   DTaP/Tdap/Td vaccine (3 - Td or Tdap) 04/28/2031   Pneumonia Vaccine  Completed   Flu Shot  Completed   HPV Vaccine  Aged Out   Zoster (Shingles) Vaccine  Discontinued    Advanced directives: (Copy Requested) Please bring a copy of your health care power of attorney and living will to the office to be added to your chart at your convenience.  Next Medicare Annual Wellness Visit scheduled for next year: yes  Understanding Your Risk for Falls Millions of people have serious injuries from falls each year. It is important to understand your risk of falling. Talk with your health care provider about your risk and what you can do to lower it. If you do have a serious fall, make sure to tell your provider. Falling once raises your risk of falling again. How can falls affect me? Serious injuries from falls are common. These include: Broken bones, such as hip fractures. Head injuries, such as traumatic brain injuries (TBI) or concussions. A fear of falling can cause you to avoid activities and  stay at home. This can make your muscles weaker and raise your risk for a fall. What can increase my risk? There are a number of risk factors that increase your risk for falling. The more risk factors you have, the higher your risk of falling. Serious injuries from a fall happen most often to people who are older than 85 years old. Teenagers and young adults ages 91-29 are also at higher risk. Common risk factors include: Weakness in the lower body. Being generally weak or confused due to long-term (chronic) illness. Dizziness or balance problems. Poor vision. Medicines that cause dizziness or drowsiness. These may include: Medicines for your blood pressure, heart, anxiety, insomnia, or swelling (edema). Pain medicines. Muscle relaxants. Other risk factors include: Drinking alcohol. Having had a fall in the past. Having foot pain or wearing improper footwear. Working at a dangerous job. Having any of the following in your home: Tripping hazards, such as floor clutter or loose rugs. Poor lighting. Pets. Having dementia or memory loss. What actions can I take to lower my risk of falling?     Physical activity Stay physically fit. Do strength and balance exercises. Consider taking a regular class to build strength and balance. Yoga and tai chi are good options. Vision Have your eyes checked every year and your prescription for glasses or contacts updated as needed. Shoes and walking aids Wear non-skid shoes. Wear shoes that have rubber soles and low heels. Do not wear high heels. Do not  walk around the house in socks or slippers. Use a cane or walker as told by your provider. Home safety Attach secure railings on both sides of your stairs. Install grab bars for your bathtub, shower, and toilet. Use a non-skid mat in your bathtub or shower. Attach bath mats securely with double-sided, non-slip rug tape. Use good lighting in all rooms. Keep a flashlight near your bed. Make sure  there is a clear path from your bed to the bathroom. Use night-lights. Do not use throw rugs. Make sure all carpeting is taped or tacked down securely. Remove all clutter from walkways and stairways, including extension cords. Repair uneven or broken steps and floors. Avoid walking on icy or slippery surfaces. Walk on the grass instead of on icy or slick sidewalks. Use ice melter to get rid of ice on walkways in the winter. Use a cordless phone. Questions to ask your health care provider Can you help me check my risk for a fall? Do any of my medicines make me more likely to fall? Should I take a vitamin D supplement? What exercises can I do to improve my strength and balance? Should I make an appointment to have my vision checked? Do I need a bone density test to check for weak bones (osteoporosis)? Would it help to use a cane or a walker? Where to find more information Centers for Disease Control and Prevention, STEADI: TonerPromos.no Community-Based Fall Prevention Programs: TonerPromos.no [employer] on Aging: BaseRingTones.pl Contact a health care provider if: You fall at home. You are afraid of falling at home. You feel weak, drowsy, or dizzy. This information is not intended to replace advice given to you by your health care provider. Make sure you discuss any questions you have with your health care provider. Document Revised: 06/12/2022 Document Reviewed: 06/12/2022 Elsevier Patient Education  2024 [employer].

## 2023-12-21 NOTE — Progress Notes (Signed)
 Please attest and cosign this visit due to patients primary care provider not being in the office at the time the visit was completed.  Because this visit was a virtual/telehealth visit,  certain criteria was not obtained, such a blood pressure, CBG if applicable, and timed get up and go. Any medications not marked as "taking" were not mentioned during the medication reconciliation part of the visit. Any vitals not documented were not able to be obtained due to this being a telehealth visit or patient was unable to self-report a recent blood pressure reading due to a lack of equipment at home via telehealth. Vitals that have been documented are verbally provided by the patient.   Subjective:   [name] is a 83 y.o. who presents for a Medicare Wellness preventive visit.  Visit Complete: Virtual I connected with  [name] on 12/21/23 by a audio enabled telemedicine application and verified that I am speaking with the correct person using two identifiers.  Patient Location: Home  Provider Location: Home Office  I discussed the limitations of evaluation and management by telemedicine. The patient expressed understanding and agreed to proceed.  Vital Signs: Because this visit was a virtual/telehealth visit, some criteria may be missing or patient reported. Any vitals not documented were not able to be obtained and vitals that have been documented are patient reported.  VideoError- Librarian, academic were attempted between this provider and patient, however failed, due to patient having technical difficulties OR patient did not have access to video capability.  We continued and completed visit with audio only.   AWV Questionnaire: No: Patient Medicare AWV questionnaire was not completed prior to this visit.  Cardiac Risk Factors include: advanced age (>65men, >5 women);dyslipidemia;hypertension;Other (see comment), Risk factor comments: cardic disease,  paroxysmal afib, hx of tachycardia     Objective:    There were no vitals filed for this visit. There is no height or weight on file to calculate BMI.     12/21/2023   10:35 AM 07/26/2021    4:20 PM 06/29/2021   11:44 AM 06/28/2019    6:23 AM 08/20/2018    3:24 PM 06/26/2017    2:32 PM 06/13/2017    8:20 AM  Advanced Directives  Does Patient Have a Medical Advance Directive? Yes Yes Yes Yes No No No  Type of Estate agent of [place];Living will Healthcare Power of [place];Living will Healthcare Power of [place];Living will Healthcare Power of [place];Living will     Does patient want to make changes to medical advance directive?   No - Patient declined      Copy of Healthcare Power of Attorney in Chart? No - copy requested No - copy requested No - copy requested      Would patient like information on creating a medical advance directive?  No - Patient declined    No - Patient declined No - Patient declined    Current Medications (verified) Outpatient Encounter Medications as of 12/21/2023  Medication Sig   amLODipine (NORVASC) 2.5 MG tablet Take 3 tablets (7.5 mg total) by mouth daily.   diphenhydrAMINE (BENADRYL) 25 MG tablet Take 25 mg by mouth every 6 (six) hours as needed for allergies (swelling).   esomeprazole (NEXIUM) 40 MG capsule TAKE ONE CAPSULE PO ONCE DAILY AS NEEDED FOR ACID REFLUX   mupirocin ointment (BACTROBAN) 2 % Apply 1 Application topically 2 (two) times daily.   RESTASIS MULTIDOSE 0.05 % ophthalmic emulsion Place 1 drop into  both eyes 2 (two) times daily.  (Patient not taking: Reported on 01/17/2023)   triamcinolone cream (KENALOG) 0.1 % Apply 1 application. topically 2 (two) times daily. Prn rash; use up to 2 weeks (Patient not taking: Reported on 07/20/2023)   No facility-administered encounter medications on file as of 12/21/2023.    Allergies (verified) Other, Penicillins, Salmon [fish allergy], and Statins   History: Past Medical  History:  Diagnosis Date   Allergy    Angioedema    Arthritis    GERD (gastroesophageal reflux disease)    Headache    Hx of echocardiogram 10/23/2005   normal   Hyperlipidemia    Hypertension    Mitral regurgitation    Mild by echo 01/2022   Paroxysmal atrial tachycardia    Noted on heart monitor 01/2022   PVC's (premature ventricular contractions)    PVC load 3.2% on heart monitor 01/2022   Thyroid disease    treated with radiation in 1980s    Past Surgical History:  Procedure Laterality Date   ABDOMINAL HYSTERECTOMY     CHOLECYSTECTOMY     COLONOSCOPY  06/15/2011   Procedure: COLONOSCOPY;  Surgeon: Malissa Hippo, MD;  Location: [facility];  Service: Endoscopy;  Laterality: N/A;   COLONOSCOPY N/A 07/27/2016   rehman: tubular adenoma   ESOPHAGOGASTRODUODENOSCOPY N/A 12/08/2016   Procedure: ESOPHAGOGASTRODUODENOSCOPY (EGD);  Surgeon: Malissa Hippo, MD;  Location: [facility];  Service: Endoscopy;  Laterality: N/A;  120   EYE SURGERY     removed cyst from right eye in june 2015. 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   Procedure: LEFT TOTAL KNEE ARTHROPLASTY;  Surgeon: Ollen Gross, MD;  Location: [facility];  Service: Orthopedics;  Laterality: Left;   TUBAL LIGATION     Family History  Problem Relation Age of Onset   Hypertension Mother    Coronary artery disease Mother    Hypertension Father    Coronary artery disease Father    Heart failure Sister    Hypertension Sister    Cancer Brother    Diabetes Brother    Social History   Socioeconomic History   Marital status: Widowed    Spouse name: Not on file   Number of children: 3   Years of education: Not on file   Highest education level: Not on file  Occupational History   Not on file  Tobacco Use   Smoking status: Never   Smokeless tobacco: Never  Vaping Use   Vaping status: Never Used   Substance and Sexual Activity   Alcohol use: No   Drug use: No   Sexual activity: Not Currently    Birth control/protection: Surgical  Other Topics Concern   Not on file  Social History Narrative   Worked at [employer] and [employer] before retired.    Works part-time.    Widow since 2017.   Social Drivers of Corporate investment banker Strain: Low Risk  (12/21/2023)   Overall Financial Resource Strain (CARDIA)    Difficulty of Paying Living Expenses: Not hard at all  Food Insecurity: No Food Insecurity (12/21/2023)   Hunger Vital Sign    Worried About Running Out of Food in the Last Year: Never true    Ran Out of Food in the Last Year: Never true  Transportation Needs: No Transportation Needs (12/21/2023)   PRAPARE -  Administrator, Civil Service (Medical): No    Lack of Transportation (Non-Medical): No  Physical Activity: Sufficiently Active (12/21/2023)   Exercise Vital Sign    Days of Exercise per Week: 7 days    Minutes of Exercise per Session: 30 min  Stress: No Stress Concern Present (12/21/2023)   [employer] of Occupational Health - Occupational Stress Questionnaire    Feeling of Stress : Not at all  Social Connections: Socially Integrated (12/21/2023)   Social Connection and Isolation Panel [NHANES]    Frequency of Communication with Friends and Family: More than three times a week    Frequency of Social Gatherings with Friends and Family: More than three times a week    Attends Religious Services: More than 4 times per year    Active Member of [employer] or Organizations: Yes    Attends Engineer, structural: More than 4 times per year    Marital Status: Married    Tobacco Counseling Counseling given: Not Answered    Clinical Intake:                        Activities of Daily Living     12/21/2023   10:35 AM  In your present state of health, do you have any difficulty performing the following activities:  Hearing? 0   Vision? 0  Difficulty concentrating or making decisions? 0  Walking or climbing stairs? 0  Dressing or bathing? 0  Doing errands, shopping? 0  Preparing Food and eating ? N  Using the Toilet? N  In the past six months, have you accidently leaked urine? N  Do you have problems with loss of bowel control? N  Managing your Medications? N  Managing your Finances? N  Housekeeping or managing your Housekeeping? N    Patient Care Team: Babs Sciara, MD as PCP - General (Family Medicine) Quintella Reichert, MD as Consulting Physician (Cardiology)  Indicate any recent Medical Services you may have received from other than [facility] providers in the past year (date may be approximate).     Assessment:   This is a routine wellness examination for [name].  Hearing/Vision screen Hearing Screening - Comments:: Patient wears hearing aids. Patient sees [facility] in [place] and is up to date on exams.   Vision Screening - Comments:: Wears rx glasses - up to date with routine eye exams  Referral to ophthalmology placed.     Goals Addressed   None    Depression Screen     12/21/2023   10:36 AM 07/20/2023   11:03 AM 01/17/2023   10:32 AM 08/30/2022   11:17 AM 07/26/2021    4:12 PM 06/29/2021   11:53 AM 06/29/2021   11:42 AM  PHQ 2/9 Scores  PHQ - 2 Score 0 0 2 0 0 0 0  PHQ- 9 Score 0 0 2        Fall Risk     12/21/2023   10:33 AM 07/20/2023   11:03 AM 01/17/2023   10:32 AM 08/30/2022   11:17 AM 12/28/2021    2:16 PM  Fall Risk   Falls in the past year? 0 0 0 0 0  Number falls in past yr: 0  0  0  Injury with Fall? 0  0  0  Risk for fall due to : No Fall Risks    No Fall Risks  Follow up Falls prevention discussed;Falls evaluation completed   Falls evaluation completed  Falls evaluation completed    MEDICARE RISK AT HOME:     TIMED UP AND GO:  Was the test performed?  No  Cognitive Function: 6CIT completed        Immunizations Immunization History  Administered Date(s)  Administered   DT (Pediatric) 04/21/2013   Fluad Quad(high Dose 65+) 07/31/2020, 07/05/2022   Fluad Trivalent(High Dose 65+) 07/20/2023   Influenza, High Dose Seasonal PF 07/09/2017   Influenza,inj,Quad PF,6+ Mos 08/23/2018   Influenza,inj,quad, With Preservative 08/05/2019   Influenza-Unspecified 07/21/2013, 08/05/2014, 07/13/2015, 07/19/2016, 07/09/2017, 08/05/2019, 07/19/2021   Moderna Sars-Covid-2 Vaccination 11/07/2019, 12/05/2019, 08/18/2020, 02/22/2021   PNEUMOCOCCAL CONJUGATE-20 07/05/2022   Pneumococcal Conjugate-13 02/17/2015   Pneumococcal-Unspecified 07/22/2006   Tdap 04/27/2021   Zoster, Live 04/18/2011   Zoster, Unspecified 04/06/2021, 10/06/2021    Screening Tests Health Maintenance  Topic Date Due   Colonoscopy  07/27/2021   COVID-19 Vaccine (5 - 2024-25 season) 06/24/2023   Medicare Annual Wellness (AWV)  12/20/2024   DEXA SCAN  01/25/2025   DTaP/Tdap/Td (3 - Td or Tdap) 04/28/2031   Pneumonia Vaccine 42+ Years old  Completed   INFLUENZA VACCINE  Completed   HPV VACCINES  Aged Out   Zoster Vaccines- Shingrix  Discontinued    Health Maintenance  Health Maintenance Due  Topic Date Due   Colonoscopy  07/27/2021   COVID-19 Vaccine (5 - 2024-25 season) 06/24/2023   Health Maintenance Items Addressed: Patient states she was told she no longer needed a repeat colonoscopy.   Additional Screening:  Vision Screening: Recommended annual ophthalmology exams for early detection of glaucoma and other disorders of the eye.  Dental Screening: Recommended annual dental exams for proper oral hygiene  Community Resource Referral / Chronic Care Management: CRR required this visit?  No   CCM required this visit?  No     Plan:     I have personally reviewed and noted the following in the patient's chart:   Medical and social history Use of alcohol, tobacco or illicit drugs  Current medications and supplements including opioid prescriptions. Patient is not  currently taking opioid prescriptions. Functional ability and status Nutritional status Physical activity Advanced directives List of other physicians Hospitalizations, surgeries, and ER visits in previous 12 months Vitals Screenings to include cognitive, depression, and falls Referrals and appointments  In addition, I have reviewed and discussed with patient certain preventive protocols, quality metrics, and best practice recommendations. A written personalized care plan for preventive services as well as general preventive health recommendations were provided to patient.     Jordan Hawks Sartaj Hoskin, CMA   12/21/2023   After Visit Summary: (Mail) Due to this being a telephonic visit, the after visit summary with patients personalized plan was offered to patient via mail   Notes: Nothing significant to report at this time.

## 2024-01-18 ENCOUNTER — Ambulatory Visit (INDEPENDENT_AMBULATORY_CARE_PROVIDER_SITE_OTHER): Payer: Medicare Other | Admitting: Nurse Practitioner

## 2024-01-18 ENCOUNTER — Encounter: Payer: Self-pay | Admitting: Nurse Practitioner

## 2024-01-18 ENCOUNTER — Ambulatory Visit: Payer: Medicare Other | Admitting: Family Medicine

## 2024-01-18 VITALS — BP 130/68 | HR 75 | Temp 97.2°F | Ht 68.0 in | Wt 189.0 lb

## 2024-01-18 DIAGNOSIS — I10 Essential (primary) hypertension: Secondary | ICD-10-CM | POA: Diagnosis not present

## 2024-01-18 DIAGNOSIS — E785 Hyperlipidemia, unspecified: Secondary | ICD-10-CM | POA: Diagnosis not present

## 2024-01-18 NOTE — Patient Instructions (Signed)
 Nevada Crane, MD   [facility], [place]

## 2024-01-19 ENCOUNTER — Encounter: Payer: Self-pay | Admitting: Nurse Practitioner

## 2024-01-19 NOTE — Progress Notes (Signed)
 Subjective:    Patient ID: [name], female    DOB: 16-Jul-1941, 83 y.o.   MRN: [id number]  HPI Presents for routine follow-up for hypertension and cholesterol.  Patient is staying very active with a busy social life.  Had her last eye exam in December, was not happy with her care.  Was referred to eye doctor in [place] but has not heard anything back.  Currently on amlodipine 2.5 mg 3 tablets/day for her blood pressure, adherent to regimen.  Only takes her as omeprazole as needed for occasional reflux.  Her main issue with her diet is intake of ice cream which she has tried to cut back on.  Denies any recent falls.  Has a history of irregular heartbeat, last seen by cardiology on 02/04/2022.  No new symptoms or changes.   Review of Systems  Constitutional:  Negative for fatigue.  Respiratory:  Negative for cough, chest tightness, shortness of breath and wheezing.   Cardiovascular:  Negative for chest pain, palpitations and leg swelling.  Neurological:  Negative for dizziness, syncope, facial asymmetry, speech difficulty, weakness, numbness and headaches.      01/18/2024    1:09 PM  Depression screen PHQ 2/9  Decreased Interest 0  Down, Depressed, Hopeless 0  PHQ - 2 Score 0  Altered sleeping 0  Tired, decreased energy 0  Change in appetite 0  Feeling bad or failure about yourself  0  Trouble concentrating 0  Moving slowly or fidgety/restless 0  Suicidal thoughts 0  PHQ-9 Score 0  Difficult doing work/chores Not difficult at all      01/18/2024    1:09 PM 07/20/2023   11:03 AM 01/17/2023   10:32 AM  GAD 7 : Generalized Anxiety Score  Nervous, Anxious, on Edge 0 0 0  Control/stop worrying 0 0 0  Worry too much - different things 0 0 0  Trouble relaxing 0 0 0  Restless 0 0 0  Easily annoyed or irritable  0 0  Afraid - awful might happen 0 0 0  Total GAD 7 Score  0 0  Anxiety Difficulty Not difficult at all Not difficult at all Not difficult at all    Social  History   Tobacco Use   Smoking status: Never   Smokeless tobacco: Never  Vaping Use   Vaping status: Never Used  Substance Use Topics   Alcohol use: No   Drug use: No        Objective:   Physical Exam NAD.  Alert, oriented.  Calm cheerful affect.  Lungs clear.  Heart slightly irregular rhythm.  Carotids no bruits or thrills.  Lower extremities no edema. Today's Vitals   01/18/24 1302 01/18/24 1337  BP: (!) 147/77 130/68  Pulse: 75   Temp: (!) 97.2 F (36.2 C)   SpO2: 98%   Weight: 189 lb (85.7 kg)   Height: 5\' 8"  (1.727 m)    Body mass index is 28.74 kg/m.  Has lost 5 pounds since her last visit.      Assessment & Plan:   Problem List Items Addressed This Visit       Cardiovascular and Mediastinum   Hypertension - Primary (Chronic)   Relevant Orders   Lipid panel   Comprehensive metabolic panel with GFR   Microalbumin / creatinine urine ratio     Other   Hyperlipemia (Chronic)   Relevant Orders   Lipid panel   Continue current medication regimen as directed. Routine labs ordered. Given information  on alternative optometrists, she will need to check to see if they accept her insurance. Recommend monitoring BP outside of the office. Continue activity and watching the amount of sugar in her diet. Return in about 6 months (around 07/20/2024).

## 2024-01-30 DIAGNOSIS — E785 Hyperlipidemia, unspecified: Secondary | ICD-10-CM | POA: Diagnosis not present

## 2024-01-30 DIAGNOSIS — I10 Essential (primary) hypertension: Secondary | ICD-10-CM | POA: Diagnosis not present

## 2024-02-01 LAB — COMPREHENSIVE METABOLIC PANEL WITH GFR
ALT: 12 IU/L (ref 0–32)
AST: 12 IU/L (ref 0–40)
Albumin: 4.1 g/dL (ref 3.7–4.7)
Alkaline Phosphatase: 92 IU/L (ref 44–121)
BUN/Creatinine Ratio: 17 (ref 12–28)
BUN: 13 mg/dL (ref 8–27)
Bilirubin Total: 0.5 mg/dL (ref 0.0–1.2)
CO2: 23 mmol/L (ref 20–29)
Calcium: 9.1 mg/dL (ref 8.7–10.3)
Chloride: 105 mmol/L (ref 96–106)
Creatinine, Ser: 0.75 mg/dL (ref 0.57–1.00)
Globulin, Total: 3 g/dL (ref 1.5–4.5)
Glucose: 80 mg/dL (ref 70–99)
Potassium: 4.5 mmol/L (ref 3.5–5.2)
Sodium: 141 mmol/L (ref 134–144)
Total Protein: 7.1 g/dL (ref 6.0–8.5)
eGFR: 78 mL/min/1.73

## 2024-02-01 LAB — LIPID PANEL
Chol/HDL Ratio: 4.4 ratio (ref 0.0–4.4)
Cholesterol, Total: 202 mg/dL — ABNORMAL HIGH (ref 100–199)
HDL: 46 mg/dL (ref >39)
LDL Chol Calc (NIH): 137 mg/dL — ABNORMAL HIGH (ref 0–99)
Triglycerides: 105 mg/dL (ref 0–149)
VLDL Cholesterol Cal: 19 mg/dL (ref 5–40)

## 2024-02-01 LAB — MICROALBUMIN / CREATININE URINE RATIO
Creatinine, Urine: 110.9 mg/dL
Microalb/Creat Ratio: 7 mg/g{creat} (ref 0–29)
Microalbumin, Urine: 7.8 ug/mL

## 2024-03-01 ENCOUNTER — Other Ambulatory Visit: Payer: Self-pay | Admitting: Nurse Practitioner

## 2024-06-06 DIAGNOSIS — H353132 Nonexudative age-related macular degeneration, bilateral, intermediate dry stage: Secondary | ICD-10-CM | POA: Diagnosis not present

## 2024-06-06 DIAGNOSIS — H353111 Nonexudative age-related macular degeneration, right eye, early dry stage: Secondary | ICD-10-CM | POA: Diagnosis not present

## 2024-06-06 DIAGNOSIS — H2513 Age-related nuclear cataract, bilateral: Secondary | ICD-10-CM | POA: Diagnosis not present

## 2024-06-06 DIAGNOSIS — H04123 Dry eye syndrome of bilateral lacrimal glands: Secondary | ICD-10-CM | POA: Diagnosis not present

## 2024-06-06 DIAGNOSIS — H353121 Nonexudative age-related macular degeneration, left eye, early dry stage: Secondary | ICD-10-CM | POA: Diagnosis not present

## 2024-08-01 ENCOUNTER — Encounter: Payer: Self-pay | Admitting: Nurse Practitioner

## 2024-08-01 ENCOUNTER — Ambulatory Visit: Admitting: Nurse Practitioner

## 2024-08-01 VITALS — BP 151/84 | HR 62 | Temp 97.9°F | Ht 68.0 in | Wt 192.0 lb

## 2024-08-01 DIAGNOSIS — E785 Hyperlipidemia, unspecified: Secondary | ICD-10-CM

## 2024-08-01 DIAGNOSIS — I10 Essential (primary) hypertension: Secondary | ICD-10-CM

## 2024-08-01 DIAGNOSIS — Z23 Encounter for immunization: Secondary | ICD-10-CM

## 2024-08-01 NOTE — Progress Notes (Signed)
 Subjective:    Patient ID: [name], female    DOB: 18-Nov-1940, 83 y.o.   MRN: [id number]  HPI Discussed the use of AI scribe software for clinical note transcription with the patient, who gave verbal consent to proceed.  History of Present Illness [name] is an 83 year old female with hypertension who presents for a routine follow-up visit.  Her usual blood pressure readings at home are in the 130s/70s-80s. She attributes elevation today to consuming high-sodium barbecue the previous night. She regularly monitors her blood pressure at home, and it typically remains stable. She is currently on a low dose of amlodipine  for blood pressure management.  She has a history of an irregular heartbeat, which was evaluated with an echocardiogram a couple of years ago, and has remained stable since. She reports no recent chest pain, shortness of breath, or leg swelling.  Her cholesterol levels have been stable for the past three years, with an LDL of 137 mg/dL. She has not been taking over-the-counter supplements recently. Her triglycerides are normal, and her HDL is above 40 mg/dL.  She sleeps well, sometimes oversleeps, and maintains an active lifestyle. She experienced near depression after her husband's passing but keeps busy with social activities to avoid isolation. Occasionally, she experiences reflux symptoms with certain foods and uses omeprazole as needed, which effectively manages her symptoms.  She reports no chest pain, shortness of breath, coughing, wheezing, visual changes, difficulty speaking or swallowing, or significant leg swelling. She reports occasional constipation. Gets regular eye exams.     Review of Systems  Respiratory:  Negative for cough, chest tightness, shortness of breath and wheezing.   Cardiovascular:  Negative for chest pain, palpitations and leg swelling.  Gastrointestinal:  Positive for constipation.  Neurological:  Negative for syncope,  facial asymmetry, speech difficulty, weakness, light-headedness, numbness and headaches.      08/01/2024    9:31 AM  Depression screen PHQ 2/9  Decreased Interest 0  Down, Depressed, Hopeless 0  PHQ - 2 Score 0  Altered sleeping 0  Tired, decreased energy 0  Change in appetite 0  Feeling bad or failure about yourself  0  Trouble concentrating 0  Moving slowly or fidgety/restless 0  Suicidal thoughts 0  PHQ-9 Score 0  Difficult doing work/chores Not difficult at all      08/01/2024    9:32 AM 01/18/2024    1:09 PM 07/20/2023   11:03 AM 01/17/2023   10:32 AM  GAD 7 : Generalized Anxiety Score  Nervous, Anxious, on Edge 0 0 0 0  Control/stop worrying 0 0 0 0  Worry too much - different things 0 0 0 0  Trouble relaxing 0 0 0 0  Restless 0 0 0 0  Easily annoyed or irritable 0  0 0  Afraid - awful might happen 0 0 0 0  Total GAD 7 Score 0  0 0  Anxiety Difficulty Not difficult at all Not difficult at all Not difficult at all Not difficult at all    Social History   Tobacco Use   Smoking status: Never   Smokeless tobacco: Never  Vaping Use   Vaping status: Never Used  Substance Use Topics   Alcohol use: No   Drug use: No        Objective:   Physical Exam Vitals and nursing note reviewed.  Constitutional:      General: She is not in acute distress. Cardiovascular:     Rate and Rhythm: Normal  rate and regular rhythm.     Heart sounds: Normal heart sounds.  Pulmonary:     Effort: Pulmonary effort is normal.     Breath sounds: Normal breath sounds.  Musculoskeletal:     Right lower leg: No edema.     Left lower leg: No edema.  Skin:    General: Skin is warm and dry.  Neurological:     Mental Status: She is alert and oriented to person, place, and time.  Psychiatric:        Mood and Affect: Mood normal.        Behavior: Behavior normal.        Thought Content: Thought content normal.    Today's Vitals   08/01/24 0914 08/01/24 0917 08/01/24 0951  BP: (!)  161/80 (!) 166/80 (!) 151/84  Pulse: 62    Temp: 97.9 F (36.6 C)    SpO2: 97%    Weight: 192 lb (87.1 kg)    Height: 5' 8 (1.727 m)     Body mass index is 29.19 kg/m.         Assessment & Plan:  1. Immunization due  - Flu vaccine HIGH DOSE PF(Fluzone Trivalent)  2. Primary hypertension (Primary) Blood pressure elevated today, likely due to high sodium intake. Home readings in the 130s/70s to 80s, her usual range. No new symptoms. Slightly elevated blood pressure acceptable in older adults to prevent falls. - Continue home blood pressure monitoring. - Report sustained elevation in blood pressure.  3. Hyperlipidemia, unspecified hyperlipidemia type Cholesterol levels slightly elevated with LDL at 137 mg/dL. Current activity level and diet positively affecting lipid profile. Continued current management without medication. - Continue current lifestyle and dietary measures.   Return in about 6 months (around 01/30/2025).

## 2024-08-02 ENCOUNTER — Encounter: Payer: Self-pay | Admitting: Nurse Practitioner

## 2024-11-11 ENCOUNTER — Telehealth: Payer: Self-pay

## 2024-11-11 MED ORDER — AMLODIPINE BESYLATE 2.5 MG PO TABS: 7.5000 mg | ORAL_TABLET | Freq: Every day | ORAL | 2 refills | Status: AC

## 2024-11-11 NOTE — Telephone Encounter (Signed)
 Copied from CRM #[id number]. Topic: Clinical - Medication Refill >> Nov 11, 2024  3:08 PM Berwyn MATSU wrote: Medication:  amLODipine  (NORVASC ) 2.5 MG tablet  Has the patient contacted their pharmacy? No (Agent: If no, request that the patient contact the pharmacy for the refill. If patient does not wish to contact the pharmacy document the reason why and proceed with request.) (Agent: If yes, when and what did the pharmacy advise?)  This is the patient's preferred pharmacy:  [facility] - [place], [place] - [address] [place] [address] Phone: [phone] Fax: [phone]    Is this the correct pharmacy for this prescription? Yes If no, delete pharmacy and type the correct one.   Has the prescription been filled recently? Yes  Is the patient out of the medication? No  Has the patient been seen for an appointment in the last year OR does the patient have an upcoming appointment? Yes  Can we respond through MyChart? No  Agent: Please be advised that Rx refills may take up to 3 business days. We ask that you follow-up with your pharmacy.

## 2024-12-26 ENCOUNTER — Ambulatory Visit: Payer: Medicare Other

## 2025-02-12 ENCOUNTER — Ambulatory Visit: Admitting: Nurse Practitioner
